# Patient Record
Sex: Female | Born: 1935 | Race: White | Hispanic: No | Smoking: Former smoker
Health system: Southern US, Community
[De-identification: ages and names within clinical notes are randomized; demographics above are authoritative.]

## PROBLEM LIST (undated history)

## (undated) DIAGNOSIS — E119 Type 2 diabetes mellitus without complications: Secondary | ICD-10-CM

## (undated) DIAGNOSIS — Z905 Acquired absence of kidney: Secondary | ICD-10-CM

## (undated) DIAGNOSIS — M810 Age-related osteoporosis without current pathological fracture: Secondary | ICD-10-CM

## (undated) DIAGNOSIS — Z8719 Personal history of other diseases of the digestive system: Secondary | ICD-10-CM

## (undated) DIAGNOSIS — K219 Gastro-esophageal reflux disease without esophagitis: Secondary | ICD-10-CM

## (undated) DIAGNOSIS — IMO0002 Reserved for concepts with insufficient information to code with codable children: Secondary | ICD-10-CM

## (undated) DIAGNOSIS — E785 Hyperlipidemia, unspecified: Secondary | ICD-10-CM

## (undated) DIAGNOSIS — I2510 Atherosclerotic heart disease of native coronary artery without angina pectoris: Secondary | ICD-10-CM

## (undated) DIAGNOSIS — K5790 Diverticulosis of intestine, part unspecified, without perforation or abscess without bleeding: Secondary | ICD-10-CM

## (undated) DIAGNOSIS — I422 Other hypertrophic cardiomyopathy: Secondary | ICD-10-CM

## (undated) DIAGNOSIS — T4145XA Adverse effect of unspecified anesthetic, initial encounter: Secondary | ICD-10-CM

## (undated) DIAGNOSIS — M069 Rheumatoid arthritis, unspecified: Secondary | ICD-10-CM

## (undated) DIAGNOSIS — T8859XA Other complications of anesthesia, initial encounter: Secondary | ICD-10-CM

## (undated) DIAGNOSIS — R233 Spontaneous ecchymoses: Secondary | ICD-10-CM

## (undated) DIAGNOSIS — R5383 Other fatigue: Secondary | ICD-10-CM

## (undated) DIAGNOSIS — IMO0001 Reserved for inherently not codable concepts without codable children: Secondary | ICD-10-CM

## (undated) DIAGNOSIS — K863 Pseudocyst of pancreas: Secondary | ICD-10-CM

## (undated) DIAGNOSIS — G479 Sleep disorder, unspecified: Secondary | ICD-10-CM

## (undated) DIAGNOSIS — Z87440 Personal history of urinary (tract) infections: Secondary | ICD-10-CM

## (undated) DIAGNOSIS — Z5189 Encounter for other specified aftercare: Secondary | ICD-10-CM

## (undated) DIAGNOSIS — R410 Disorientation, unspecified: Secondary | ICD-10-CM

## (undated) DIAGNOSIS — R0602 Shortness of breath: Secondary | ICD-10-CM

## (undated) DIAGNOSIS — I442 Atrioventricular block, complete: Secondary | ICD-10-CM

## (undated) DIAGNOSIS — R238 Other skin changes: Secondary | ICD-10-CM

## (undated) DIAGNOSIS — I10 Essential (primary) hypertension: Secondary | ICD-10-CM

## (undated) DIAGNOSIS — N200 Calculus of kidney: Secondary | ICD-10-CM

## (undated) DIAGNOSIS — Z950 Presence of cardiac pacemaker: Secondary | ICD-10-CM

## (undated) HISTORY — DX: Age-related osteoporosis without current pathological fracture: M81.0

## (undated) HISTORY — DX: Atherosclerotic heart disease of native coronary artery without angina pectoris: I25.10

## (undated) HISTORY — PX: OTHER SURGICAL HISTORY: SHX169

## (undated) HISTORY — PX: MYOMECTOMY: SHX85

## (undated) HISTORY — PX: CATARACT EXTRACTION, BILATERAL: SHX1313

## (undated) HISTORY — DX: Atrioventricular block, complete: I44.2

## (undated) HISTORY — DX: Calculus of kidney: N20.0

## (undated) HISTORY — DX: Presence of cardiac pacemaker: Z95.0

## (undated) HISTORY — DX: Hyperlipidemia, unspecified: E78.5

## (undated) HISTORY — DX: Essential (primary) hypertension: I10

## (undated) HISTORY — DX: Other fatigue: R53.83

## (undated) HISTORY — DX: Type 2 diabetes mellitus without complications: E11.9

## (undated) HISTORY — DX: Other hypertrophic cardiomyopathy: I42.2

## (undated) HISTORY — PX: NEPHRECTOMY: SHX65

## (undated) HISTORY — DX: Personal history of urinary (tract) infections: Z87.440

---

## 1998-02-14 ENCOUNTER — Ambulatory Visit (HOSPITAL_COMMUNITY): Admission: RE | Admit: 1998-02-14 | Discharge: 1998-02-14 | Payer: Self-pay | Admitting: Family Medicine

## 1998-02-14 ENCOUNTER — Encounter: Payer: Self-pay | Admitting: Family Medicine

## 2004-02-11 HISTORY — PX: INSERT / REPLACE / REMOVE PACEMAKER: SUR710

## 2005-04-10 HISTORY — PX: CORONARY ARTERY BYPASS GRAFT: SHX141

## 2005-04-17 ENCOUNTER — Inpatient Hospital Stay (HOSPITAL_BASED_OUTPATIENT_CLINIC_OR_DEPARTMENT_OTHER): Admission: RE | Admit: 2005-04-17 | Discharge: 2005-04-17 | Payer: Self-pay | Admitting: *Deleted

## 2005-04-17 HISTORY — PX: CARDIAC CATHETERIZATION: SHX172

## 2005-04-22 ENCOUNTER — Ambulatory Visit (HOSPITAL_COMMUNITY): Admission: RE | Admit: 2005-04-22 | Discharge: 2005-04-22 | Payer: Self-pay | Admitting: Cardiothoracic Surgery

## 2005-04-30 ENCOUNTER — Inpatient Hospital Stay (HOSPITAL_COMMUNITY)
Admission: RE | Admit: 2005-04-30 | Discharge: 2005-05-12 | Payer: Self-pay | Admitting: Thoracic Surgery (Cardiothoracic Vascular Surgery)

## 2005-04-30 ENCOUNTER — Encounter: Payer: Self-pay | Admitting: Cardiology

## 2005-05-01 ENCOUNTER — Encounter (INDEPENDENT_AMBULATORY_CARE_PROVIDER_SITE_OTHER): Payer: Self-pay | Admitting: Specialist

## 2005-05-21 ENCOUNTER — Encounter
Admission: RE | Admit: 2005-05-21 | Discharge: 2005-05-21 | Payer: Self-pay | Admitting: Thoracic Surgery (Cardiothoracic Vascular Surgery)

## 2005-06-05 ENCOUNTER — Encounter (HOSPITAL_COMMUNITY): Admission: RE | Admit: 2005-06-05 | Discharge: 2005-09-03 | Payer: Self-pay | Admitting: *Deleted

## 2008-03-20 ENCOUNTER — Encounter: Admission: RE | Admit: 2008-03-20 | Discharge: 2008-03-20 | Payer: Self-pay | Admitting: Family Medicine

## 2008-10-26 ENCOUNTER — Encounter: Admission: RE | Admit: 2008-10-26 | Discharge: 2008-10-26 | Payer: Self-pay | Admitting: Family Medicine

## 2008-12-29 ENCOUNTER — Inpatient Hospital Stay (HOSPITAL_COMMUNITY): Admission: EM | Admit: 2008-12-29 | Discharge: 2009-01-02 | Payer: Self-pay | Admitting: Emergency Medicine

## 2009-01-01 ENCOUNTER — Ambulatory Visit: Payer: Self-pay | Admitting: Vascular Surgery

## 2009-01-01 ENCOUNTER — Encounter (INDEPENDENT_AMBULATORY_CARE_PROVIDER_SITE_OTHER): Payer: Self-pay | Admitting: Internal Medicine

## 2009-11-30 ENCOUNTER — Ambulatory Visit: Payer: Self-pay | Admitting: Cardiology

## 2009-12-17 ENCOUNTER — Encounter: Payer: Self-pay | Admitting: Internal Medicine

## 2010-01-23 ENCOUNTER — Ambulatory Visit: Payer: Self-pay | Admitting: Internal Medicine

## 2010-03-03 ENCOUNTER — Encounter: Payer: Self-pay | Admitting: Family Medicine

## 2010-03-12 NOTE — Miscellaneous (Signed)
Summary: Device preload  Clinical Lists Changes  Observations: Added new observation of PPM INDICATN: CHB (12/17/2009 8:00) Added new observation of MAGNET RTE: BOL 85 ERI 65 (12/17/2009 8:00) Added new observation of PPMLEADSTAT2: active (12/17/2009 8:00) Added new observation of PPMLEADSER2: [id number] (12/17/2009 8:00) Added new observation of PPMLEADMOD2: 5076  (12/17/2009 8:00) Added new observation of PPMLEADDOI2: 05/05/2005  (12/17/2009 8:00) Added new observation of PPMLEADLOC2: RV  (12/17/2009 8:00) Added new observation of PPMLEADSTAT1: active  (12/17/2009 8:00) Added new observation of PPMLEADSER1: [id number]  (12/17/2009 8:00) Added new observation of PPMLEADMOD1: 5076  (12/17/2009 8:00) Added new observation of PPMLEADDOI1: 05/05/2005  (12/17/2009 8:00) Added new observation of PPMLEADLOC1: RA  (12/17/2009 8:00) Added new observation of PPM DOI: 05/05/2005  (12/17/2009 8:00) Added new observation of PPM SERL#: [id number]  (12/17/2009 8:00) Added new observation of PPM MODL#: [id number]  (12/17/2009 6:57) Added new observation of PACEMAKERMFG: Medtronic  (12/17/2009 8:00) Added new observation of PPM IMP MD: Charlynn Court  (12/17/2009 8:00) Added new observation of PPM REFER MD: Vonna Drafts  (12/17/2009 8:00) Added new observation of PACEMAKER MD: Lewayne Bunting, MD  (12/17/2009 8:00)      PPM Specifications Following MD:  Lewayne Bunting, MD     Referring MD:  Vonna Drafts PPM Vendor:  Medtronic     PPM Model Number:  ADDR01     PPM Serial Number:  [id number] PPM DOI:  05/05/2005     PPM Implanting MD:  Charlynn Court  Lead 1    Location: RA     DOI: 05/05/2005     Model #: [id number]     Serial #: [id number]     Status: active Lead 2    Location: RV     DOI: 05/05/2005     Model #: [id number]     Serial #: [id number]     Status: active  Magnet Response Rate:  BOL 85 ERI 65  Indications:  CHB

## 2010-03-14 NOTE — Procedures (Signed)
Summary: pacer check/medtronic   Current Medications (verified): 1)  Glimepiride 4 Mg Tabs (Glimepiride) .... 1/2 By Mouth At Bedtime 2)  Centrum Silver  Tabs (Multiple Vitamins-Minerals) .... One By Mouth Daily 3)  Plavix 75 Mg Tabs (Clopidogrel Bisulfate) .... One By Mouth Daily 4)  Ranitidine Hcl 300 Mg Tabs (Ranitidine Hcl) .... 1/2 By Mouth Daily 5)  Prednisone 5 Mg Tabs (Prednisone) .... One By Mouth Daily 6)  Evista 60 Mg Tabs (Raloxifene Hcl) .... One By Mouth Daily 7)  Metoprolol Tartrate 50 Mg Tabs (Metoprolol Tartrate) .... 1/2 By Mouth Daily 8)  Metformin Hcl 1000 Mg Tabs (Metformin Hcl) .... One By Mouth Daily 9)  Niacin Cr 500 Mg Cr-Caps (Niacin) .... One By Mouth Daily 10)  Lovastatin 20 Mg Tabs (Lovastatin) .... One By Mouth Daily 11)  Trimethoprim 100 Mg Tabs (Trimethoprim) .... One By Mouth [place] 12)  Chlordiazepoxide-Amitriptyline 5-12.5 Mg Tabs (Chlordiazepoxide-Amitriptyline) .Marland Kitchen.. 1 By Mouth Daily 13)  Hydrocodone-Acetaminophen 5-500 Mg Tabs (Hydrocodone-Acetaminophen) .... As Needed  Allergies (verified): 1)  ! Codeine  PPM Specifications Following MD:  Lewayne Bunting, MD     Referring MD:  Vonna Drafts PPM Vendor:  Medtronic     PPM Model Number:  ADDR01     PPM Serial Number:  [id number] H PPM DOI:  05/05/2005     PPM Implanting MD:  Charlynn Court  Lead 1    Location: RA     DOI: 05/05/2005     Model #: [id number]     Serial #: [id number]     Status: active Lead 2    Location: RV     DOI: 05/05/2005     Model #: [id number]     Serial #: [id number]     Status: active  Magnet Response Rate:  BOL 85 ERI 65  Indications:  CHB   PPM Follow Up Battery Voltage:  2.76 V     Battery Est. Longevity:  5 YRS       PPM Device Measurements Atrium  Amplitude: 5.60 mV, Impedance: 468 ohms, Threshold: 0.50 V at 0.40 msec Right Ventricle  Amplitude: PACED mV, Impedance: 638 ohms, Threshold: 0.50 V at 0.40 msec  Episodes MS Episodes:  3     Percent Mode Switch:  <0.1%      Ventricular High Rate:  1     Atrial Pacing:  49.2%     Ventricular Pacing:  99.9%  Parameters Mode:  DDDR     Lower Rate Limit:  70     Upper Rate Limit:  130 Paced AV Delay:  180     Sensed AV Delay:  160 Next Cardiology Appt Due:  04/11/2010 Tech Comments:  [place] [facility] PT---3 MODE SWITCHES--ALL LESS THAN 1 MINUTE.  1 VHR EPISODE LASTING 6 BEATS.  NORMAL DEVICE FUNCTION.  CHANGED RA OUTPUT FROM 1.5 TO 2.0 AND RV OUTPUT FROM 2.0 TO 2.5 V. ROV IN 3 MTHS W/GT. REQUESTED TRANSFER FROM [place] [facility] THRU [facility].  PT WILL RESTART [employer] TRANSMISSIONS AFTER OV W/GT.  Vella Kohler  January 23, 2010 9:58 AM

## 2010-03-14 NOTE — Cardiovascular Report (Signed)
Summary: Office Visit   Office Visit   Imported By: Roderic Ovens 02/08/2010 16:25:07  _____________________________________________________________________  External Attachment:    Type:   Image     Comment:   External Document

## 2010-04-22 ENCOUNTER — Encounter: Payer: Self-pay | Admitting: Internal Medicine

## 2010-05-07 ENCOUNTER — Encounter: Payer: Self-pay | Admitting: Internal Medicine

## 2010-05-07 ENCOUNTER — Ambulatory Visit (INDEPENDENT_AMBULATORY_CARE_PROVIDER_SITE_OTHER): Payer: Medicare Other | Admitting: Internal Medicine

## 2010-05-07 VITALS — BP 140/80 | Ht 65.0 in | Wt 158.0 lb

## 2010-05-07 DIAGNOSIS — E119 Type 2 diabetes mellitus without complications: Secondary | ICD-10-CM

## 2010-05-07 DIAGNOSIS — I442 Atrioventricular block, complete: Secondary | ICD-10-CM | POA: Insufficient documentation

## 2010-05-07 DIAGNOSIS — I10 Essential (primary) hypertension: Secondary | ICD-10-CM | POA: Insufficient documentation

## 2010-05-07 DIAGNOSIS — I2510 Atherosclerotic heart disease of native coronary artery without angina pectoris: Secondary | ICD-10-CM

## 2010-05-07 DIAGNOSIS — Z950 Presence of cardiac pacemaker: Secondary | ICD-10-CM | POA: Insufficient documentation

## 2010-05-07 DIAGNOSIS — I422 Other hypertrophic cardiomyopathy: Secondary | ICD-10-CM | POA: Insufficient documentation

## 2010-05-07 NOTE — Patient Instructions (Signed)
Your physician recommends that you schedule a follow-up appointment in: 12 MONTHS with Dr Ladona Ridgel  Your physician recommends that you continue on your current medications as directed. Please refer to the Current Medication list given to you today.  Remote monitoring is used to monitor your Pacemaker of ICD from home. This monitoring reduces the number of office visits required to check your device to one time per year. It allows [place] to keep an eye on the functioning of your device to ensure it is working properly. You are scheduled for a device check from home on 08/08/10. You may send your transmission at any time that day. If you have a wireless device, the transmission will be sent automatically. After your physician reviews your transmission, you will receive a postcard with your next transmission date.

## 2010-05-07 NOTE — Progress Notes (Signed)
HPI  [name] is referred today by Dr. [place] for ongoing PPM followup. She is a pleasant 77 yo woman with a h/o CAD, s/p CABG, HTN, CHB, s/p septal myomectomy, who is here today to establish for PPM evaluation and management. She is bothered mostly by arthritic complaints. She is sedentary but denies DOE. No edema or syncope. No c/p.   Allergies  Allergen Reactions  . Codeine      Current Outpatient Prescriptions  Medication Sig Dispense Refill  . amitriptyline-chlordiazePOXIDE (LIMBITROL) 5-12.5 MG per tablet Take 1 tablet by mouth daily.        . clopidogrel (PLAVIX) 75 MG tablet Take 75 mg by mouth daily.        Marland Kitchen glimepiride (AMARYL) 4 MG tablet Take 2 mg by mouth daily before breakfast.        . HYDROcodone-acetaminophen (VICODIN) 5-500 MG per tablet Take 1 tablet by mouth every 6 (six) hours as needed.        . lovastatin (MEVACOR) 20 MG tablet Take 20 mg by mouth at bedtime.        . metFORMIN (GLUCOPHAGE) 1000 MG tablet Take 1,000 mg by mouth daily.        . metoprolol (LOPRESSOR) 50 MG tablet Take 25 mg by mouth daily.        . Multiple Vitamins-Minerals (CENTRUM SILVER PO) Take by mouth daily.        . niacin 500 MG CR capsule Take 500 mg by mouth at bedtime.        . predniSONE (DELTASONE) 5 MG tablet Take 5 mg by mouth daily.        . raloxifene (EVISTA) 60 MG tablet Take 60 mg by mouth daily.        . ranitidine (ZANTAC) 300 MG tablet Take 150 mg by mouth at bedtime.        Marland Kitchen trimethoprim (TRIMPEX) 100 MG tablet Take 100 mg by mouth daily.           Past Medical History  Diagnosis Date  . Pacemaker     ROS:   All systems reviewed and negative except as noted in the HPI.   Past Surgical History  Procedure Date  . Insert / replace / remove pacemaker      No family history on file.   History   Social History  . Marital Status: Widowed    Spouse Name: N/A    Number of Children: N/A  . Years of Education: N/A   Occupational History  . Not on file.    Social History Main Topics  . Smoking status: Former Smoker -- 1.0 packs/day    Types: Cigarettes    Quit date: 05/07/1990  . Smokeless tobacco: Never Used  . Alcohol Use: No  . Drug Use: No  . Sexually Active: Not on file   Other Topics Concern  . Not on file   Social History Narrative  . No narrative on file     BP 140/80  Ht 5\' 5"  (1.651 m)  Wt 158 lb (71.668 kg)  BMI 26.29 kg/m2  Physical Exam:  Well appearing NAD HEENT: Unremarkable Neck:  No JVD, no thyromegally Lymphatics:  No adenopathy Back:  No CVA tenderness Lungs:  Clear with no wheezes. Well healed PPM incision. HEART:  Regular rate rhythm, no murmurs, no rubs, no clicks Abd:  Flat, positive bowel sounds, no organomegally, no rebound, no guarding Ext:  2 plus pulses, no edema, no cyanosis, no clubbing Skin:  No rashes no nodules Neuro:  CN II through XII intact, motor grossly intact  EKG NSR with P synchronous ventricular pacing.  DEVICE  Normal device function.  See PaceArt for details.   Assess/Plan:

## 2010-05-07 NOTE — Assessment & Plan Note (Signed)
Her blood pressure is minimally elevated. She will continue her current meds and a low sodium diet.

## 2010-05-07 NOTE — Assessment & Plan Note (Signed)
She denies anginal symptoms. Continue current meds. 

## 2010-05-07 NOTE — Assessment & Plan Note (Signed)
Her device is working normally. Will recheck in several months. She is about 4-5 yrs before her generator will need replacement.

## 2010-05-15 LAB — BASIC METABOLIC PANEL
BUN: 12 mg/dL (ref 6–23)
CO2: 25 mEq/L (ref 19–32)
Chloride: 109 mEq/L (ref 96–112)
Creatinine, Ser: 0.97 mg/dL (ref 0.4–1.2)
Potassium: 3.7 mEq/L (ref 3.5–5.1)

## 2010-05-15 LAB — DIFFERENTIAL
Basophils Absolute: 0 10*3/uL (ref 0.0–0.1)
Basophils Relative: 0 % (ref 0–1)
Eosinophils Relative: 1 % (ref 0–5)
Lymphocytes Relative: 15 % (ref 12–46)
Monocytes Absolute: 0.3 10*3/uL (ref 0.1–1.0)

## 2010-05-15 LAB — URINE MICROSCOPIC-ADD ON

## 2010-05-15 LAB — GLUCOSE, CAPILLARY
Glucose-Capillary: 151 mg/dL — ABNORMAL HIGH (ref 70–99)
Glucose-Capillary: 175 mg/dL — ABNORMAL HIGH (ref 70–99)
Glucose-Capillary: 196 mg/dL — ABNORMAL HIGH (ref 70–99)
Glucose-Capillary: 204 mg/dL — ABNORMAL HIGH (ref 70–99)
Glucose-Capillary: 210 mg/dL — ABNORMAL HIGH (ref 70–99)
Glucose-Capillary: 215 mg/dL — ABNORMAL HIGH (ref 70–99)
Glucose-Capillary: 241 mg/dL — ABNORMAL HIGH (ref 70–99)

## 2010-05-15 LAB — POCT CARDIAC MARKERS: Troponin i, poc: <0.05 ng/mL (ref 0.00–0.09)

## 2010-05-15 LAB — CBC
HCT: 38.5 % (ref 36.0–46.0)
MCHC: 34.8 g/dL (ref 30.0–36.0)
MCV: 93.4 fL (ref 78.0–100.0)
Platelets: 178 10*3/uL (ref 150–400)
RDW: 15.8 % — ABNORMAL HIGH (ref 11.5–15.5)

## 2010-05-15 LAB — LIPID PANEL
Cholesterol: 167 mg/dL (ref 0–200)
LDL Cholesterol: UNDETERMINED mg/dL (ref 0–99)

## 2010-05-15 LAB — CARDIAC PANEL(CRET KIN+CKTOT+MB+TROPI)
CK, MB: 0.9 ng/mL (ref 0.3–4.0)
CK, MB: 0.9 ng/mL (ref 0.3–4.0)
Relative Index: INVALID (ref 0.0–2.5)
Relative Index: INVALID (ref 0.0–2.5)
Total CK: 31 U/L (ref 7–177)
Total CK: 31 U/L (ref 7–177)
Troponin I: 0.02 ng/mL (ref 0.00–0.06)

## 2010-05-15 LAB — URINE CULTURE

## 2010-05-15 LAB — COMPREHENSIVE METABOLIC PANEL
Albumin: 3.7 g/dL (ref 3.5–5.2)
BUN: 12 mg/dL (ref 6–23)
Calcium: 9.2 mg/dL (ref 8.4–10.5)
Creatinine, Ser: 1.06 mg/dL (ref 0.4–1.2)
Glucose, Bld: 183 mg/dL — ABNORMAL HIGH (ref 70–99)
Total Protein: 6 g/dL (ref 6.0–8.3)

## 2010-05-15 LAB — CK TOTAL AND CKMB (NOT AT ARMC)
CK, MB: 0.8 ng/mL (ref 0.3–4.0)
Relative Index: INVALID (ref 0.0–2.5)
Total CK: 33 U/L (ref 7–177)

## 2010-05-15 LAB — URINALYSIS, ROUTINE W REFLEX MICROSCOPIC
Bilirubin Urine: NEGATIVE
Hgb urine dipstick: NEGATIVE
Ketones, ur: 15 mg/dL — AB
Nitrite: NEGATIVE
pH: 5 (ref 5.0–8.0)

## 2010-05-15 LAB — APTT: aPTT: 24 seconds (ref 24–37)

## 2010-06-28 NOTE — Op Note (Signed)
[name], [name]                 ACCOUNT NO.:  [id number]   MEDICAL RECORD NO.:  [id number]          PATIENT TYPE:  INP   LOCATION:  2309                         FACILITY:  [facility]   PHYSICIAN:  Salvatore Decent. Cornelius Moras, M.D. DATE OF BIRTH:  1924-12-19   DATE OF PROCEDURE:  05/01/2005  DATE OF DISCHARGE:                                 OPERATIVE REPORT   PREOPERATIVE DIAGNOSES:  1.  Severe three-vessel coronary artery disease.  2.  Hypertrophic obstructive cardiomyopathy with subaortic stenosis.   PROCEDURE:  Median sternotomy for coronary artery bypass grafting x4 (left  internal mammary artery to distal left anterior descending coronary artery,  saphenous vein graft to first diagonal branch, saphenous vein graft to ramus  intermediate branch, saphenous vein graft to distal right coronary artery,  endoscopic saphenous vein harvest from right thigh) and septal myomectomy.   SURGEON:  Salvatore Decent. Cornelius Moras, M.D.   ASSISTANT:  Pecola Leisure, PA   ANESTHESIA:  General.   BRIEF CLINICAL NOTE:  The patient is a 77 year old widowed white female who  currently lives in [place] and is followed by Dr. Windle Guard and  Dr. Lady Deutscher, with history of hypertension, hyperlipidemia, and  hypertrophic obstructive cardiomyopathy with subaortic stenosis.  The  patient now presents with worsening symptoms of fatigue, shortness of breath  and chest discomfort.  The chest discomfort was initially attributed to  indigestion, although the patient has noted increasing substernal chest  pressure radiating to her shoulders that is exacerbated by activity.  The  patient also has severe rheumatoid arthritis which is steroid-dependent.  The patient has had serial 2-D echocardiograms demonstrating left  ventricular hypertrophy with asymmetric septal hypertrophy and subaortic  stenosis, with normal left ventricular systolic function.  The patient  subsequently underwent cardiac catheterization  demonstrating severe two-  vessel coronary artery disease.  The patient was also noted to have elevated  left ventricular end-diastolic pressure at the time of catheterization.  Right heart catheterization was not performed.  She was initially referred  to Dr. Tyrone Sage, who saw her in consultation on March 15.  She was felt to  be a candidate for surgery to include coronary artery bypass grafting with  possible septal myomectomy.  She was scheduled for elective surgery on March  23.  However, during the ensuing week the patient developed worsening  symptoms of chest pain and suffered a prolonged episode of substernal chest  pain associated with shortness of breath.  When she presented for  preoperative screening prior to elective surgery, she communicated these  worsening symptoms and was promptly admitted to the hospital for further  management and therapy.   The patient and her son have been counseled at length regarding the  indications and potential benefits of surgical intervention for coronary  artery bypass grafting with possible septal myomectomy.  Alternative  treatment strategies have been discussed in detail.  They understand and  accept all associated risks of surgery including but not limited to risk of  death, stroke, myocardial infarction, congestive heart failure, respiratory  failure, pneumonia, bleeding requiring blood transfusion, arrhythmia, heart  block  with bradycardia requiring permanent pacemaker, recurrent coronary  artery disease, late complications related to hypertrophic obstructive  cardiomyopathy.  All of their questions have been addressed.  They desire to  proceed with surgery as described.   OPERATIVE FINDINGS:  1.  Severe hypertrophic obstructive cardiomyopathy with severe left      ventricular hypertrophy and severely impaired left ventricular diastolic      function.  2.  Asymmetric septal hypertrophy with moderate subaortic stenosis.  3.   Good-quality left internal mammary artery and saphenous vein conduit for      grafting.  4.  Good-quality target vessels for bypass grafting.  5.  Severe left ventricular diastolic dysfunction requiring milrinone      infusion for weaning and separation from cardiopulmonary bypass.   OPERATIVE NOTE IN DETAIL:  The patient is brought to the operating room on  the above-mentioned date and central monitoring is established by the  anesthesia service under the care and direction of Dr. Kipp Brood.  Specifically, a Swan-Ganz catheter is placed through the right internal  jugular approach.  A radial arterial line is placed.  Intravenous  antibiotics are administered.  Following induction with general endotracheal  anesthesia, a Foley catheter is placed.  The patient's chest, abdomen, both  groins, and both lower extremities are prepared and draped in sterile  manner.   Baseline transesophageal echocardiogram is performed by Dr. Noreene Larsson.  This  confirms the presence of severe left ventricular hypertrophy.  There is some  asymmetric septal hypertrophy with some subaortic stenosis.  The aortic  valve itself appears normal.  The mitral valve appears somewhat thickened.  There is intermittent systolic anterior motion of the mitral valve.  The  left atrium is dilated.  There is trace to mild mitral regurgitation.   A median sternotomy incision is performed and the left internal mammary  artery is dissected from the chest wall and prepared for bypass grafting.  The left internal mammary artery is good-quality conduit.  Of note, the  patient's sternum is severely osteoporotic and frail.  The patient's skin is  also frail consistent with longstanding steroid use.  Simultaneously  saphenous vein is obtained the patient's right thigh using endoscopic vein  harvest technique.  The saphenous vein is notably good-quality conduit. After the saphenous vein is removed, the small surgical incision in the   right thigh is closed in multiple layers with running absorbable suture.   The patient is heparinized systemically.  The pericardium is opened.  The  ascending aorta is normal in appearance.  The ascending aorta and the right  atrium are cannulated for cardiopulmonary bypass.  A retrograde cardioplegia  catheter is placed through the right atrium into the coronary sinus.   Cardiopulmonary bypass is begun and the surface of the heart is inspected.  There is severe left ventricular hypertrophy with mild biventricular  enlargement.  Distal sites are selected for coronary bypass grafting.  A  left ventricular vent is placed through the right superior pulmonary vein.  A temperature probe is placed in the left ventricular septum.  A  cardioplegia catheter is placed in the ascending aorta.   The patient is allowed to cool passively to 32 degrees' systemic  temperature.  The aortic crossclamp is applied and cold blood cardioplegia  is administered in an antegrade fashion through the aortic root.  Iced  saline slush is applied for topical hypothermia.  Supplemental cardioplegia  is administered retrograde through the coronary sinus catheter.  The initial  cardioplegic arrest  and myocardial cooling are felt to be excellent.  Repeat  doses of cardioplegia are administered intermittently throughout the  crossclamp portion of the operation both through the aortic root, down the  subsequently-placed vein grafts, and retrograde through the coronary sinus  catheter to maintain left ventricular septal temperature below 15 degrees  centigrade.   The following distal coronary anastomoses are performed:  1.  The distal right coronary artery is grafted with a saphenous vein graft      in an end-to-side fashion.  This vessel measures 1.5 mm in diameter and      is good-quality at the site of distal bypass.  2.  The ramus intermediate branch is grafted with a saphenous vein graft in      an end-to-side  fashion.  This vessel measures 1.1 mm in diameter and is      good-quality at the site of distal bypass.  3.  The first diagonal branch off the left anterior descending coronary      artery is grafted with a saphenous vein graft in an end-to-side fashion.      This vessel measures 1.5 mm in diameter and is good-quality at the site      of distal bypass.  4.  The distal left anterior descending coronary artery is grafted with the      left internal mammary artery in an end-to-side fashion.  This vessel      measures 2.0 mm in diameter and is good-quality the site of distal      bypass.   An oblique aortotomy incision is performed.  The aortic valve is normal in  appearance.  The aortic valve is gently retracted to expose the  interventricular septum beneath.  There is obvious septal hypertrophy with  some fibrosis and scarring along the endocardial surface of the interventricular septum immediately opposed to the anterior leaflet of the  mitral valve.  The subaortic stenosis is significant but not severe.  Septal  myomectomy is performed using standard technique with an 11 blade knife to  create a longitudinal incision beginning approximately in the middle of the  septum underneath the right cusp of the aortic valve.  This is completed  straight through into the left ventricular chamber beneath.  A similar  parallel longitudinal incision is made approximately 1 cm towards the left,  several millimeters to the right of the commissure between the left and  right cusp of the aortic valve.  Finally, one transverse longitudinal  incision is made joining these two incisions to create a rectangular trough  through the interventricular septum.  The rectangular piece of muscle is  removed and sent to pathology.  After the myomectomy is completed, one can  see directly into the left ventricular chamber without obstruction.  The  size of the myomectomy is not terribly large as far as myomectomy  specimens  go, but the degree of subaortic stenosis was not as severe is seen at times,  either, and then myomectomy is felt to be more than adequate.  After the  myomectomy is completed, the aortic valve is reinspected to make sure it is  perfectly intact and competent.  The aortotomy is then closed using a two-  layer closure with running 4-0 Prolene suture.   All three proximal saphenous vein anastomoses are performed directly to the  ascending aorta prior to removal of the aortic crossclamp.  The left  ventricular septal temperature rises rapidly with reperfusion of the left  internal mammary artery.  One final dose of warm retrograde hot-shot  cardioplegia is administered.  The lungs are ventilated and the heart  allowed to fill with the patient in Trendelenburg position to evacuate any  residual air through the aortic root.  The aortic crossclamp is removed  after a total crossclamp time of 94 minutes.   All proximal and distal coronary anastomoses are inspected for hemostasis  and appropriate graft orientation.  The saphenous vein graft to the diagonal  branch appears to be a little bit short when the heart is filled, and  subsequently the proximal anastomosis of this vein graft is moved to the  body of the vein graft to the ramus intermediate branch to prevent the graft  from being under tension.  Epicardial pacing wires are fixed to the right  ventricular free wall and to the right atrial appendage.  The patient is  rewarmed to 37 degrees centigrade temperature.  The patient has an extremely  slow bradycardic response, and AV sequential pacing is employed.   An initial attempt at weaning from cardiopulmonary bypass without inotropic  support is performed.  Upon attempts to wean from cardiopulmonary bypass,  the patient's left ventricular chamber remains completely collapsed and underfilled despite pushing filling pressures substantially.  The lungs are  fairly compliant and not  obstructed to flow, but rather the left ventricle  is severely noncompliant.  Cardiopulmonary bypass is continued and the heart  allowed to rest an additional 12 minutes.  A second attempt at weaning from  cardiopulmonary bypass is notable for slightly improved hemodynamics but  still inadequate relaxation of the left ventricle with secondary inadequate  cardiac output.  The patient is rested in an additional 30 minutes on  cardiopulmonary bypass and low-dose milrinone infusion is begun.  The  patient is subsequently weaned from cardiopulmonary bypass without  difficulty.  The patient's rhythm at separation from bypass is an extremely  slow bradycardic junctional response, and AV sequential pacing is employed.  The patient's heart rate is kept slower than usual to facilitate filling.  The patient still is noted to have severely impaired left ventricular  diastolic function with relatively small ventricular chamber despite pushing  left ventricular filling pressures.  The degree of subaortic stenosis  appears to have been alleviated and there does not appear to be a  substantial gradient after completion of the procedure and weaning from  cardiopulmonary bypass.  The patient is ultimately weaned from  cardiopulmonary bypass after a total cardiopulmonary bypass time of 177  minutes.   The venous and arterial cannulae are removed uneventfully.  Protamine is  administered to reverse the anticoagulation.  The patient is transfused two  10-packs of adult platelets and two units fresh frozen plasma due to  coagulopathy and thrombocytopenia.  The mediastinum and both left and right  pleural spaces are drained using four chest tubes exited through separate  stab incisions inferiorly.  The median sternotomy is closed with single-  strength sternal wire.  The soft tissues anterior to the sternum are closed  in multiple layers and the skin is closed with skin staples.   The patient tolerates the  procedure well and is transported to the surgical  intensive care unit in stable condition.  There are no intraoperative  complications.  All sponge, instrument and needle counts verified correct at  completion of the operation.      Salvatore Decent. Cornelius Moras, M.D.  Electronically Signed     CHO/MEDQ  D:  05/01/2005  T:  05/03/2005  Job:  [id number]   cc:   Elmore Guise., M.D.  Fax: [phone]   Windle Guard, M.D.  Fax: [phone]

## 2010-06-28 NOTE — Op Note (Signed)
NAMERONESHA, HEENAN NO.:  [id number]   MEDICAL RECORD NO.:  [id number]          PATIENT TYPE:  INP   LOCATION:  2309                         FACILITY:  [facility]   PHYSICIAN:  Guadalupe Maple, M.D.  DATE OF BIRTH:  May 11, 1924   DATE OF PROCEDURE:  04/30/2005  DATE OF DISCHARGE:                                 OPERATIVE REPORT   PROCEDURE:  Intraoperative transesophageal echocardiography   Ms. [name] is a 77 year old white female with a history of two-vessel  coronary artery disease. She was also found to have severe left ventricular  dysfunction and was felt to have asymmetric septal hypertrophy, and was felt  to have a gradient across the left ventricular outflow tract by preoperative  transthoracic echocardiography. Intraoperative transesophageal  echocardiography was requested to evaluate the left ventricular function to  determine if any evidence of left ventricular outflow tract obstruction was  present and to aid in the performance of a septal myomectomy if necessary.   The patient was brought to the operating room at [facility] and  general anesthesia was induced without difficulty. The trachea was intubated  without difficulty.   IMPRESSION:  Pre bypass findings:  1.  Aortic valve:  The aortic valve was trileaflet, opened normally. There      was no aortic insufficiency and no significant calcification in the      aortic valve leaflets.  2.  Mitral valve:  The mitral valve leaflets appeared thickened and there      was an increased areas of thickening at the coaptation point. There was      1+ mitral insufficiency. Upon careful examination of the mitral valve,      it appeared there was evidence of systolic anterior motion of the mitral      valve in several views, especially in the mid esophageal four-chamber      view. There was also turbulence in the left ventricular outflow tract.      Continuous wave Doppler interrogation of the left  ventricular outflow      tract from the deep transgastric view demonstrated a maximal velocity of      2.4 cm per second which translated into a maximal gradient of 23 mmHg      and a mean gradient of 9.3 mmHg.  3.  Left ventricle:  The left ventricle showed marked left ventricular      hypertrophy which at the mid papillary level appeared symmetrical with a      left ventricular wall thickness of 1.75 cm of septum at the mid      papillary level. In the area of the left ventricular outflow tract there      appeared to be a knuckle of increased thickness of the interventricular      septum which measured 1.83 cm. Left ventricular function was      hyperdynamic, ejection fraction estimated at 75%. The cavity size was      small.  4.  Right ventricle:  The right ventricular size was normal. The right      ventricular  chamber was triangular in shape. There was good systolic      downward motion of the lateral tricuspid annulus and good contractility      of the right ventricular free wall. This is consistent with good right      ventricular function.  5.  Tricuspid valve:  The tricuspid valve leaflets appeared structurally      normal with 1+ tricuspid insufficiency.  6.  Interatrial septum:  The interatrial septum was intact. There was      moderate lipomatous hypertrophy noted of the interatrial septum but      there was no evidence of patent foramen ovale or atrial septal defect.  7.  Left atrium:  There was no spontaneous echo contrast noted in the left      atrial cavity. There was no thrombus noted in the left atrium or left      atrial appendage.  8.  Ascending aorta:  The ascending aorta  showed a good, well-defined      aortic root and well-defined sinotubular ridge with moderate      calcification of the wall of the descending aorta with mild atheromatous      disease present.  9.  Descending aorta:  The descending aorta showed mild to moderate      atheromatous disease and  measured 2.8 cm in diameter.   Post bypass findings:  1.  Aortic valve:  The aortic valve was unchanged from the pre bypass study.      The valve opened normally without evidence of aortic insufficiency.  2.  Mitral valve:  There was residual 1+ mitral insufficiency, a central      jet. There was thickening of the mitral valve leaflets which was more      present at the point of coaptation, which was unchanged from the pre      bypass study. I was unable to see systolic anterior motion of the mitral      valve upon multiple views. Due to technical difficulties, I was unable      to assess the gradient across the left ventricular outflow tract by      continuous wave Doppler.  3.  Left ventricle:  There was moderate to severe left ventricular      hypertrophy present. There was good contractility of the left ventricle      in all segments interrogated. There was no evidence of a ventricular      septal defect noted by color wave Doppler interrogation.           ______________________________  Guadalupe Maple, M.D.     DCJ/MEDQ  D:  05/02/2005  T:  05/03/2005  Job:  [id number]

## 2010-06-28 NOTE — Discharge Summary (Signed)
NAMETACY, CHAVIS NO.:  [id number]   MEDICAL RECORD NO.:  [id number]          PATIENT TYPE:  INP   LOCATION:  2017                         FACILITY:  [facility]   PHYSICIAN:  Salvatore Decent. Cornelius Moras, M.D. DATE OF BIRTH:  29-Jun-1924   DATE OF ADMISSION:  04/30/2005  DATE OF DISCHARGE:                                 DISCHARGE SUMMARY   PRIMARY DIAGNOSES:  1.  Severe three-vessel coronary artery disease.  2.  Hypertrophic obstructive cardiomyopathy with subaortic stenosis.   IN-HOSPITAL DIAGNOSIS:  1.  Complete heart block.  2.  Newly diagnosed diabetes mellitus type 2.  3.  Volume overload.  4.  Postop intensive care unit psychosis.   SECONDARY DIAGNOSES:  1.  Hypertension.  2.  Hyperlipidemia.  3.  History tobacco abuse.  4.  Rheumatoid arthritis.  5.  Status post hysterectomy.  6.  Status post right nephrectomy.  7.  Status post right elbow fracture, screws place for repair.  8.  Status post bilateral cataract removal.   ALLERGIES:  Allergic to CODEINE.   IN-HOSPITAL OPERATIONS AND PROCEDURES:  1.  Coronary artery bypass grafting x4 using a left internal mammary artery      to distal left anterior descending coronary artery, saphenous vein graft      to first diagonal branch, saphenous vein graft to ramus intermediate      branch, saphenous vein graft to distal RCA.  Endoscopic saphenous vein      harvest was done from the right thigh.  2.  Septal myomectomy.  3.  Permanent pacemaker placement.   HISTORY AND PHYSICAL AND HOSPITAL COURSE:  The patient is 77 year old  Caucasian female, who has history of hypertension, hyperlipidemia and  hypertrophic obstructive cardiomyopathy with subaortic stenosis.  The  patient presents with worsening symptoms of fatigue, shortness of breath and  chest discomfort.  The patient underwent 2-D echocardiogram which  demonstrated left ventricular hypertrophy with asymmetric septal hypertrophy  and subaortic stenosis with  normal left ventricular systolic function.  This  was done by Dr. Reyes Ivan.  The patient was then taken for cardiac  catheterization which demonstrated severe two-vessel coronary artery  disease.  During catheterization, the patient was also noted to have  elevated left ventricular end-diastolic pressures.  The patient was  initially evaluated by Dr. Tyrone Sage who has scheduled for to undergo  coronary artery bypass grafting to have possible septal myomectomy.  She was  scheduled for surgery May 02, 2005.  However, she presented for  preoperative testing and during this time, the patient started developing  worsening symptoms of chest pain and suffered a prolonged episode of  substernal chest pain associated with shortness of breath.  Due to these  symptoms, the patient was admitted to [facility] on the  April 30, 2005.  The patient was seen at this time by Dr. Cornelius Moras.  Dr. Cornelius Moras  discussed with the patient and family, undergoing coronary artery bypass  grafting with possible septal myomectomy.  He discussed risks and benefits  of procedure.  The patient acknowledged her understanding and agreed to  proceed.  Surgery was scheduled for May 01, 2005, to be done by Dr. Cornelius Moras.   The patient was taken to the operating room on May 01, 2005, where she  underwent coronary artery bypass grafting x4 using a left internal mammary  artery to distal left anterior descending coronary artery, saphenous vein  graft to first diagonal branch, saphenous vein graft to ramus intermediate  branch, saphenous vein graft to distal RCA.  Endoscopic saphenous vein  harvesting was done from the right thigh.  The patient also underwent septal  myomectomy.  The patient tolerated this procedure well and was transferred  up to the intensive care unit in stable condition.  Following surgery, the  patient was seen to be hemodynamically stable.  The patient was extubated  late evening/early morning  following surgery.  Following extubation, the  patient was seen to have developed postop confusion.  She continued to have  confusion during the first several days postoperatively.  Family member was  required to stay with the patient during the evening time.  The patient's  confusion did start to improve.  Her mental status was coming back to normal  and by postop day #5, the patient was alert and oriented x3 and neuro  intact.   During the patient's postoperative course, her hemoglobin A1c was seen to be  7.1 preoperatively.  The patient was diagnosed with newly diagnosed diabetes  mellitus type 2.  CBG were monitored.  She was started on insulin sliding  scale.  CBGs remained elevated even with use of sliding scale insulin.  She  was started on Glucophage postop day #7.  Following initiation of  Glucophage, the patient's CBCs were better controlled.  She did remain on  sliding scale insulin while in hospital.  The patient will need to follow up  with primary physician following discharge to manage the patient's diabetes  mellitus.   Also during the patient's postoperative course, she required continued VVI  pacing.  Dr. Reyes Ivan was consulted for evaluation.  The patient was unable to  be weaned off of her external pacer.  Underlying rhythm showed complete  heart block.  Unfortunately, use of ACE inhibitor and beta blocker did not  change the complete heart block.  It was recommended the patient undergo  permanent pacemaker placement.  Dr. Reyes Ivan discussed with the patient  undergoing this procedure.  She acknowledged her understanding and agreed to  proceed.  She was taken down to the cath lab May 05, 2005, where she had a  permanent pacemaker placed.  The patient tolerated this procedure well and  was transferred back up to the intensive care unit in stable condition.  Dr.  Silva Bandy office will be managing the patient's pacemaker.  The patient also developed volume overload  postoperatively.  She was started  on diuretics.  Her preoperative weight was seen to be 154 pounds.  Postop  day #8, the patient was still overweight at 162.3 pounds.  Plan is to  continue the patient on diuretics for several days following discharge home.   The patient's chest tubes and lines were discontinued in the normal fashion.  She was out of bed ambulating well.  The patient's incisions were dry and  intact and healing well.  She does have staples on her sternal incision.  The patient remained afebrile during her postoperative course.  Vital signs  were monitored and remained stable.  She was able to be weaned off oxygen,  sating greater than 90% on room air.  The patient was tolerating regular  diet well.  No nausea, vomiting noted.  Bowel movements within normal limits  prior to discharge home.  External pacing wires were discontinued without  difficulty.  Lower extremities were seen to have significant edema which did  improve prior to discharge home.  The patient did develop, although, a  blister  from this on her right lower extremity which did perforate.  Antibiotic appointment was applied b.i.d.  A home health nurse will be  arranged for management of the patient's wounds.   The patient remained hemodynamically stable during her postoperative course.  Last hemoglobin and hematocrit on postop day #7 were obtained and seen to be  10.8 and 31.2.  The patient's creatinine remained stable during her  postoperative course and postop day #7 was seen to be 0.9.   The patient is tentatively ready for discharge home postop day #9, May 12, 2005.  Follow-up appointment will be scheduled with Dr. Cornelius Moras in three weeks.  The patient will follow up Dr. Kerby Nora in two weeks.  At this appointment, she  will obtain a PA and lateral chest x-ray which she will then bring with her  to Dr. Orvan July appointment.  Ms. Simpson received instructions on diet,  activity level and incisional care.  She was  told no driving until released  to do so.  No heavy lifting over 10 pounds.  The patient was told she is  allowed to wash her incisions using soap and water.  She is to contact the  office if she develops any drainage or opening from any of her incision  sites.  Staple removal appointment will be arranged and office follow-up  will contact the patient with this appointment.  The patient was told to  ambulate three to four times per day, progress as tolerated and continue her  breathing exercises.  She was educated on diet to be low-fat, low-salt well  as carbohydrate modified medium calorie diet.  Again, the patient will  follow up with primary care physician for management of diabetes mellitus.   DISCHARGE MEDICATIONS:  1.  Aspirin 325 mg daily.  2.  Lopressor 50 mg b.i.d.  3.  Altace 10 mg daily.  4.  Lovastatin 20 mg at night.  5.  Evista 60 mg three times a week.  6.  Prevacid daily.  7.  Prednisone 10 mg daily.  8.  Verapamil 80 mg t.i.d. 9.  Folic acid 1 mg daily.  10. Lasix 40 mg daily x7 days.  11. Potassium chloride 20 mEq daily x10 days.  12. Glucophage 500 mg b.i.d.  13. Oxycodone 5 mg one to two tablets q.4-6h. p.r.n. pain.      Theda Belfast, PA      Salvatore Decent. Cornelius Moras, M.D.  Electronically Signed    KMD/MEDQ  D:  05/11/2005  T:  05/12/2005  Job:  [id number]   cc:   Elmore Guise., M.D.  Fax: [phone]

## 2010-06-28 NOTE — Cardiovascular Report (Signed)
NAMELEONOR, [name] NO.:  [id number]   MEDICAL RECORD NO.:  [id number]          PATIENT TYPE:  OIB   LOCATION:  1961                         FACILITY:  [facility]   PHYSICIAN:  Elmore Guise., M.D.DATE OF BIRTH:  [birth year]   DATE OF PROCEDURE:  04/17/2005  DATE OF DISCHARGE:                              CARDIAC CATHETERIZATION   INDICATIONS FOR PROCEDURE:  Continued chest pain and shortness of breath.   HISTORY OF PRESENT ILLNESS:  The patient is very pleasant 77 year old white  female with past medical history of hypertension and hypertrophic  cardiomyopathy, who presented with continued chest pain and dyspnea. The  patient had a Cardiolite stress test which showed no significant perfusion  defects, however, mild t.i.d. She was initially treated medically, however,  her symptoms have continued, therefore needing a heart catheterization.   DESCRIPTION OF PROCEDURE:  The patient was brought to the cardiac cath lab  after appropriate informed consent.  She was prepped and draped in sterile  fashion. Approximately 10 mL of 1% lidocaine used for local anesthesia. A 4-  French sheath placed in the right femoral artery without difficulty.  Coronary angiography, LV angiography were then performed without difficulty.  The patient was transferred from the cardiac cath lab in stable condition.   FINDINGS:  1.  Left Main:  Mild to moderate calcification and mild distal tapering.  2.  LAD:  Mild proximal calcification with proximal sequential 80-90%      stenosis prior to branch on first diagonal and first septal perforators.      She has mid and distal luminal irregularities in her LAD vessel.  3.  D1:  Moderate size vessel with ostial proximal 80-90% stenosis noted.  4.  Left circumflex:  Is nondominant with mild luminal irregularities. She      has two moderate to large sized OM vessels without any significant      disease.  5.  Left system gives bridging  collaterals both from the circumflex vessel      in the LAD to the distal RCA, PDA and PLV vessels.  6.  RCA:  Dominant, with diffuse calcification in the ostial/proximal and      mid portions of the vessel. There is diffuse disease ranging from the      proximal portion to the mid vessel with 100% mid to distal occlusion      noted. The distal vessel does fill well via left-to-right collaterals,      noted filling the PDA PLV and distal RCA.  7.  LV:  EF is 65%. No wall motion abnormalities. LVEDP is 14 mmHg, No      aortic or intracardiac gradient noted on pullback from apex to proximal      aorta   IMPRESSION:  1.  Obstructive multivessel coronary disease with 100% distal right coronary      artery and 80-90% sequential and proximal left anterior descending      disease.  2.  Hyperdynamic left ventricular systolic function, ejection fraction of      65%. No wall motion abnormalities. Left  ventricular end diastolic pressure was 14 mmHg.  No aortic or      intracardiac gradient noted on pullback from apex to proximal aorta   PLAN:  Aggressive medical therapy and surgical consult for  revascularization.      Elmore Guise., M.D.  Electronically Signed     TWK/MEDQ  D:  04/17/2005  T:  04/17/2005  Job:  [id number]   cc:   Windle Guard, M.D.  Fax: [phone]

## 2010-06-28 NOTE — Cardiovascular Report (Signed)
[name], [name] NO.:  [id number]   MEDICAL RECORD NO.:  [id number]          PATIENT TYPE:  INP   LOCATION:  2309                         FACILITY:  [facility]   PHYSICIAN:  Elmore Guise., M.D.DATE OF BIRTH:  May 15, 1924   DATE OF PROCEDURE:  05/05/2005  DATE OF DISCHARGE:                              CARDIAC CATHETERIZATION   INDICATIONS FOR PROCEDURE:  Complete heart block.   DESCRIPTION OF PROCEDURE:  The patient is brought to the cardiac cath lab  after appropriate informed consent.  She is prepped and draped in a sterile  fashion.  Approximately 20 mL of 1% lidocaine was used for local anesthesia.  A 2 inch incision was made in the left deltopectoral groove.  A subcutaneous  pocket then made with blunt and Bovie dissection.  After hemostasis was  obtained, a subclavian venogram was then performed showing the root of the  left subclavian/axillary vein.  The left axillary vein was then accessed in  two separate sticks.  Two 7-French safety sheaths were placed over the  retained wires.  The ventricular lead was placed in the right ventricular  septum.  The following measurements were obtained:  R-waves measured 16.5 mV  with impedance of 1075 ohms, threshold 0.8 volts at 0.5 milliseconds with a  current of 0.9 mA, 10 volts check was negative.  The ventricular lead is a  Medtronic [id number] cm, serial number [id number].  The atrial lead was then  placed with the following measurements:  P-waves measured 1.6 mV with  impedance 513 ohms, threshold 0.5 volts, 0.5 milliseconds, a current of 1.1  mA, 10 volts check was negative.  The atrial lead is a Medtronic 5076-45 cm,  serial number [id number].  The atrial and ventricular leads were then  identified and placed in the appropriate header response to an Adapta  Medtronic generator, ADDRO1, serial number [id number].  The generator was  sewn into the pocket.  Prior to sewing the generator in the pocket, the  atrial  and ventricular leads were sewn into the pocket also.  The wound was  then closed in three continuous layers with 2-0, followed by 2-0, followed  by 4-0 Vicryl sutures.  Steri-Strips were applied.  The patient tolerated  the procedure well and remained hemodynamically stable throughout the  procedure.  She will be transferred back to her room for further monitoring.      Elmore Guise., M.D.  Electronically Signed     TWK/MEDQ  D:  05/05/2005  T:  05/06/2005  Job:  [id number]

## 2010-06-28 NOTE — Consult Note (Signed)
[name], [name] NO.:  [id number]   MEDICAL RECORD NO.:  [id number]           PATIENT TYPE:   LOCATION:                                 FACILITY:   PHYSICIAN:  Sheliah Plane, MD         DATE OF BIRTH:   DATE OF CONSULTATION:  DATE OF DISCHARGE:                                   CONSULTATION   REQUESTING PHYSICIAN:  Rosine Abe, M.D.   FOLLOW UP CARDIOLOGIST:  Rosine Abe, M.D.   PRIMARY CARE PHYSICIAN:  Windle Guard, M.D.   REASON FOR CONSULTATION:  Coronary artery disease and question of asymmetric  septal hypertrophy.   HISTORY OF PRESENT ILLNESS:  The patient is a 77 year old female who notes  that she have been having increasing episodes of having fatigue and having  no energy along with chest discomfort, which she describes as worsening  indigestion, but it is associated with increasing activity, sometimes at  rest.  It becomes worse after she eats, but without any relief with Rolaids  or Tums.  She also has been getting increasingly dyspneic with exertion.  She at times has nocturnal symptoms.  She denies orthopnea or significant  lower extremity edema.  She denies previous MI or angioplasty.   CARDIAC RISK FACTORS:  1.  Hypertension.  2.  Hyperlipidemia.  3.  She denies diabetes.  4.  She is a remote smoker; smoked for 40 years and quit 10 years ago.   FAMILY HISTORY:  Father died of cancer at 16, unknown type.  Mother died at  age 45 of lung cancer.  One brother died at age 66 of myocardial infarction.  The patient has had no history of stroke.  Denies claudication.  Denies  renal insufficiency, so she has had a right nephrectomy.   PAST MEDICAL HISTORY:  1.  Arthritis, rheumatoid, on prednisone 10 mg daily (had been on 5 mg a day      for more than 25 years).  2.  Increasing indigestion symptoms, question angina.   PAST SURGICAL HISTORY:  1.  Hysterectomy.  2.  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  3.  Right elbow fracture with screws placed for repair.  4.  Bilateral cataract removal.   SOCIAL HISTORY:  The patient is a widow.  She currently has moved from  [place] and is living with her son.  She is retired.   FAMILY AND SOCIAL HISTORY:  Positive as noted above.  She is a retired  Psychologist, educational.  She smoked for 20 years.  Drinks an occasional glass of  wine.   MEDICATIONS:  1.  Prednisone 10 mg daily.  2.  Lovastatin 20 mg daily.  3.  Verapamil 240 mg daily.  4.  Evista 60 mg daily.  5.  Aspirin 81 mg daily.  6.  Toprol-XL 12.5 mg daily.  7.  Centrum.  8.  Vitamin C.  9.  Vitamin D.  10. Niacin 100 mg b.i.d.   ALLERGIES:  CODEINE causes nausea.   REVIEW OF SYSTEMS:  CARDIAC:  Positive for chest pain, resting shortness of  breath, exertional shortness of breath and presyncope.  She denies  orthopnea, syncope, palpitations and lower extremity edema.  GENERAL:  Denies constitutional symptoms, other than fatigue, and denies fever,  chills, anorexia.  EYES:  Denies blurred vision, diplopia, or other visual  changes.  ENT:  Denies hearing loss or ear drainage.  CV:  As noted above.  RESPIRATORY:  Does have shortness of breath.  Denies hemoptysis.  Denies  orthopnea.  GI:  Denies gallbladder.  Does have indigestion.  Denies  hematochezia, melena, dysarthria, dysphagia.  GU:  Denies nocturia.  SKIN:  Without ischemic changes.  MUSCULOSKELETAL:  Does complain of some chronic  back and hip pain.  Denies neurologic changes.  Denies amaurosis or TIA.  Denies psychiatric history, other than some anxiety about considering  surgery.  Denies allergies, except as noted above.   PHYSICAL EXAMINATION:  GENERAL:  The patient appears her stated age of 30  years.  VITAL SIGNS:  Blood pressure is 130/64, pulse 76 and regular, respiratory  rate 18.  The patient has had evidence of bilateral cataracts.  NECK:  Without carotid bruits, without jugular venous distention.  LUNGS:   Clear bilaterally.  CARDIAC:  Regular rate and rhythm with a normal S1 and S2, with a 2/6 to 3/6  early systolic murmur heard along the right sternal border.  ABDOMEN:  Benign without palpable masses.  EXTREMITIES:  Lower extremities appear to have adequate veins for bypass.  She has 1+ DP and PT pulses bilaterally.   LABORATORY DATA:  Laboratory findings are pending.   Echocardiograms done in the past have suggested LV gradient from asymmetric  obstructive cardiomyopathy.  The most recent echocardiogram showed a  decrease in the gradient in the 60 mm range.  Because of the patient's  progressive symptoms, a cardiac catheterization was performed by Dr. Reyes Ivan.  The results of this were discussed, as the report does not mention any  intracavity pressures; however, at the time of catheterization, the patient  was found to have coronary occlusive disease with preserved left ventricular  function.  The left ventricular end diastolic pressure was 40.  The right  was totally occluded.  LAD had 80% to 90% stenosis.  There was 80% stenosis  of the first diagonal.  The circumflex was without significant disease.   In discussing the case with Dr. Reyes Ivan, there was no intracavity or  transvalvular gradient appreciated at the time of catheterization.   IMPRESSION:  The patient with progressive anginal symptoms with significant  coronary occlusive disease total right and high-grade left anterior  descending stenosis and preserved left ventricular function with the  question of septal hypertrophy, but without confirmed gradient changes  across the ventricle at the time of cardiac catheterization.  I have  recommended to the patient that we proceed with coronary artery bypass  grafting.  At the time, we will place TEE and also look at the septum, and  only if there is significant evidence by transesophageal echocardiogram of significant asymmetric hypertrophy will proceed with myomectomy.  The  risks  of the procedure including death, infection, stroke, myocardial infarction,  bleeding and blood transfusion were all discussed with the patient.  The  possibility of myomectomy was also discussed, and specific complications  from this including septal perforation or heart block were also discussed.  The patient was agreeable with this, and will tentatively arrange for  surgery on May 02, 2005.  Sheliah Plane, MD  Electronically Signed     EG/MEDQ  D:  04/24/2005  T:  04/24/2005  Job:  [id number]   cc:   Elmore Guise., M.D.  Fax: [phone]   Windle Guard, M.D.  Fax: [phone]

## 2010-07-19 ENCOUNTER — Other Ambulatory Visit: Payer: Self-pay | Admitting: Cardiology

## 2010-07-19 NOTE — Telephone Encounter (Signed)
escribe medication per fax request  

## 2010-07-29 ENCOUNTER — Other Ambulatory Visit: Payer: Self-pay | Admitting: Cardiology

## 2010-07-29 MED ORDER — METOPROLOL TARTRATE 50 MG PO TABS: ORAL_TABLET | ORAL | Status: DC

## 2010-07-29 NOTE — Telephone Encounter (Signed)
Med refill

## 2010-08-08 ENCOUNTER — Encounter: Payer: PRIVATE HEALTH INSURANCE | Admitting: *Deleted

## 2010-08-13 ENCOUNTER — Encounter: Payer: Self-pay | Admitting: *Deleted

## 2010-09-27 ENCOUNTER — Other Ambulatory Visit: Payer: Self-pay | Admitting: *Deleted

## 2010-09-27 DIAGNOSIS — I10 Essential (primary) hypertension: Secondary | ICD-10-CM

## 2010-10-02 ENCOUNTER — Other Ambulatory Visit: Payer: Self-pay | Admitting: Cardiology

## 2010-10-02 MED ORDER — NITROGLYCERIN 0.4 MG SL SUBL: 0.4000 mg | SUBLINGUAL_TABLET | SUBLINGUAL | Status: DC | PRN

## 2010-10-02 NOTE — Telephone Encounter (Signed)
Jessie called from [facility].  Pt needs a refill for nitroglycerin.  Chart in box.  °

## 2010-10-02 NOTE — Telephone Encounter (Signed)
escribe medication per fax request  

## 2010-10-31 ENCOUNTER — Encounter: Payer: Self-pay | Admitting: Cardiology

## 2010-11-08 ENCOUNTER — Other Ambulatory Visit: Payer: Self-pay | Admitting: Cardiology

## 2010-11-08 ENCOUNTER — Other Ambulatory Visit (INDEPENDENT_AMBULATORY_CARE_PROVIDER_SITE_OTHER): Payer: Medicare Other | Admitting: *Deleted

## 2010-11-08 DIAGNOSIS — I10 Essential (primary) hypertension: Secondary | ICD-10-CM

## 2010-11-08 LAB — CBC WITH DIFFERENTIAL/PLATELET
Basophils Absolute: 0.1 10*3/uL (ref 0.0–0.1)
Basophils Relative: 2.1 % (ref 0.0–3.0)
Eosinophils Relative: 2.9 % (ref 0.0–5.0)
HCT: 39.4 % (ref 36.0–46.0)
Hemoglobin: 13.1 g/dL (ref 12.0–15.0)
Lymphocytes Relative: 29.3 % (ref 12.0–46.0)
Lymphs Abs: 1.7 10*3/uL (ref 0.7–4.0)
Monocytes Relative: 9.7 % (ref 3.0–12.0)
Neutro Abs: 3.2 10*3/uL (ref 1.4–7.7)
RBC: 4.26 Mil/uL (ref 3.87–5.11)
RDW: 14.7 % — ABNORMAL HIGH (ref 11.5–14.6)
WBC: 5.7 10*3/uL (ref 4.5–10.5)

## 2010-11-08 LAB — LIPID PANEL
Cholesterol: 172 mg/dL (ref 0–200)
HDL: 45.1 mg/dL
Total CHOL/HDL Ratio: 4
Triglycerides: 297 mg/dL — ABNORMAL HIGH (ref 0.0–149.0)
VLDL: 59.4 mg/dL — ABNORMAL HIGH (ref 0.0–40.0)

## 2010-11-08 LAB — HEPATIC FUNCTION PANEL
ALT: 17 U/L (ref 0–35)
AST: 21 U/L (ref 0–37)
Albumin: 3.9 g/dL (ref 3.5–5.2)
Alkaline Phosphatase: 41 U/L (ref 39–117)
Bilirubin, Direct: 0.1 mg/dL (ref 0.0–0.3)
Total Bilirubin: 0.4 mg/dL (ref 0.3–1.2)
Total Protein: 6.5 g/dL (ref 6.0–8.3)

## 2010-11-08 LAB — BASIC METABOLIC PANEL WITH GFR
BUN: 19 mg/dL (ref 6–23)
CO2: 26 meq/L (ref 19–32)
Calcium: 9.2 mg/dL (ref 8.4–10.5)
Chloride: 109 meq/L (ref 96–112)
Creatinine, Ser: 1 mg/dL (ref 0.4–1.2)
GFR: 57.45 mL/min — ABNORMAL LOW
Glucose, Bld: 98 mg/dL (ref 70–99)
Potassium: 4.2 meq/L (ref 3.5–5.1)
Sodium: 144 meq/L (ref 135–145)

## 2010-11-08 LAB — TSH: TSH: 0.65 u[IU]/mL (ref 0.35–5.50)

## 2010-11-12 ENCOUNTER — Telehealth: Payer: Self-pay | Admitting: *Deleted

## 2010-11-12 NOTE — Telephone Encounter (Signed)
Notified of lab results. Will see in office 10/4.

## 2010-11-12 NOTE — Telephone Encounter (Signed)
Message copied by Lorayne Bender on Tue Nov 12, 2010  3:29 PM ------      Message from: [place], PETER M      Created: Mon Nov 11, 2010  8:46 AM       Triglycerides and hdl improved. Cholesterol stable. Other labs look great.      Theron Arista [place]

## 2010-11-14 ENCOUNTER — Ambulatory Visit (INDEPENDENT_AMBULATORY_CARE_PROVIDER_SITE_OTHER): Payer: Medicare Other | Admitting: Cardiology

## 2010-11-14 ENCOUNTER — Encounter: Payer: Self-pay | Admitting: Cardiology

## 2010-11-14 VITALS — BP 118/88 | HR 78 | Ht 64.0 in | Wt 157.1 lb

## 2010-11-14 DIAGNOSIS — I422 Other hypertrophic cardiomyopathy: Secondary | ICD-10-CM

## 2010-11-14 DIAGNOSIS — I442 Atrioventricular block, complete: Secondary | ICD-10-CM

## 2010-11-14 DIAGNOSIS — E119 Type 2 diabetes mellitus without complications: Secondary | ICD-10-CM

## 2010-11-14 DIAGNOSIS — I2510 Atherosclerotic heart disease of native coronary artery without angina pectoris: Secondary | ICD-10-CM

## 2010-11-14 DIAGNOSIS — E785 Hyperlipidemia, unspecified: Secondary | ICD-10-CM

## 2010-11-14 NOTE — Patient Instructions (Addendum)
Transmit your pacemaker check remotely.  Continue your current medications.  We will see you again in 6 months.

## 2010-11-16 DIAGNOSIS — E785 Hyperlipidemia, unspecified: Secondary | ICD-10-CM | POA: Insufficient documentation

## 2010-11-16 NOTE — Progress Notes (Signed)
[name] Date of Birth: 09-06-1924   History of Present Illness: [name] is seen for followup visit today. She is seen with her son. She has a history of coronary disease and is status post CABG in March of 2007. This included an LIMA graft to the distal LAD, saphenous vein graft to the diagonal, saphenous vein graft to the intermediate branch, and saphenous vein graft to the distal right coronary. She also had a septal myectomy at that time for hypertrophic cardiomyopathy. She is status post pacemaker implant for complete heart block. She reports that she is doing well. She is more concerned about her arthritis for which she has been on chronic steroid therapy. She denies any significant chest pain, shortness of breath, or palpitations. She's had no edema or orthopnea. It looks like she missed her last pacemaker check.  Current Outpatient Prescriptions on File Prior to Visit  Medication Sig Dispense Refill  . amitriptyline-chlordiazePOXIDE (LIMBITROL) 5-12.5 MG per tablet Take 1 tablet by mouth daily.        Marland Kitchen glimepiride (AMARYL) 4 MG tablet Take 2 mg by mouth daily before breakfast.        . HYDROcodone-acetaminophen (VICODIN) 5-500 MG per tablet Take 1 tablet by mouth every 6 (six) hours as needed.        . lovastatin (MEVACOR) 20 MG tablet Take 20 mg by mouth at bedtime.        . metFORMIN (GLUCOPHAGE) 1000 MG tablet Take 1,000 mg by mouth daily.        . metoprolol (LOPRESSOR) 50 MG tablet Take 1/2 tablet (25mg ) two times daily   30 tablet  5  . Multiple Vitamins-Minerals (CENTRUM SILVER PO) Take by mouth daily.        . niacin 500 MG CR capsule Take 500 mg by mouth at bedtime.        . nitroGLYCERIN (NITROSTAT) 0.4 MG SL tablet Place 1 tablet (0.4 mg total) under the tongue every 5 (five) minutes as needed for chest pain.  25 tablet  11  . PLAVIX 75 MG tablet TAKE 1 TABLET BY MOUTH DAILY WITH A MEAL  30 tablet  5  . predniSONE (DELTASONE) 5 MG tablet Take 5 mg by mouth daily.         . raloxifene (EVISTA) 60 MG tablet Take 60 mg by mouth daily.        . ranitidine (ZANTAC) 300 MG tablet Take 150 mg by mouth at bedtime.        Marland Kitchen trimethoprim (TRIMPEX) 100 MG tablet Take 100 mg by mouth daily.          Allergies  Allergen Reactions  . Codeine     Past Medical History  Diagnosis Date  . Pacemaker   . CAD (coronary artery disease)   . CHB (complete heart block)   . HTN (hypertension), benign   . DM (diabetes mellitus)   . Hypertrophic cardiomyopathy   . Hyperlipidemia   . Arthritis   . OP (osteoporosis)   . Hx: UTI (urinary tract infection)   . Fatigue     Past Surgical History  Procedure Date  . Insert / replace / remove pacemaker   . Cardiac catheterization 04/17/2005    EF 65%  . Septal myectomy   . Nephrectomy     RIGHT  . Arm fracture surgery     RIGHT ARM  . Coronary artery bypass graft 04/2005    LIMA GRAFT TO THE DISTAL LAD, SAPHENOUS  VEIN GRAFT TO THE DIAGONAL, SAPHENOUS VEIN GRAFT TO THE INTERMEDIATE BRANCH, AND SAPHENOUS VEIN GRAFT TO THE DISTAL RIGHT CORONARY  . Myomectomy   . Cataract extraction, bilateral     History  Smoking status  . Former Smoker -- 1.0 packs/day  . Types: Cigarettes  . Quit date: 05/07/1990  Smokeless tobacco  . Never Used    History  Alcohol Use No    Family History  Problem Relation Age of Onset  . Lung cancer Mother   . Cancer Father   . Heart attack Brother     Review of Systems: The review of systems is positive for chronic arthralgias.  All other systems were reviewed and are negative.  Physical Exam: BP 118/88  Pulse 78  Ht 5\' 4"  (1.626 m)  Wt 157 lb 1.9 oz (71.269 kg)  BMI 26.97 kg/m2 She is an elderly white female in no acute distress. She is normocephalic, atraumatic. Pupils are equal round and reactive to light and accommodation. Extraocular movements are full. Oropharynx is clear. Neck is supple without JVD, adenopathy, thyromegaly, or bruits. Lungs are clear. Cardiac exam reveals a  grade 1/6 systolic ejection murmur at the right upper sternal border. Abdomen is soft and nontender. Her knees are without edema. She has good pedal pulses. She has extensive arthritic changes in her hands. Her neurologic exam is nonfocal. LABORATORY DATA:   Assessment / Plan:

## 2010-11-16 NOTE — Assessment & Plan Note (Signed)
She is status post pacemaker implant. She is due for pacemaker check. Her son will call in to our pacemaker clinic and download  remotely.

## 2010-11-16 NOTE — Assessment & Plan Note (Signed)
She is status post septal myectomy. She hasn't mild outflow tract obstruction all medical therapy. She is asymptomatic.

## 2010-11-16 NOTE — Assessment & Plan Note (Signed)
Her recent lipid parameters were significantly improved. She still has elevated triglycerides. She is on niacin and statin therapy. She cannot tolerate fish oil because of GI side effects.

## 2010-12-05 ENCOUNTER — Other Ambulatory Visit: Payer: Self-pay | Admitting: Internal Medicine

## 2010-12-05 ENCOUNTER — Ambulatory Visit (INDEPENDENT_AMBULATORY_CARE_PROVIDER_SITE_OTHER): Payer: Medicare Other | Admitting: *Deleted

## 2010-12-05 ENCOUNTER — Encounter: Payer: Self-pay | Admitting: Internal Medicine

## 2010-12-05 DIAGNOSIS — I442 Atrioventricular block, complete: Secondary | ICD-10-CM

## 2010-12-08 LAB — REMOTE PACEMAKER DEVICE
ATRIAL PACING PM: 48
BAMS-0001: 175 {beats}/min
RV LEAD IMPEDENCE PM: 838 Ohm
RV LEAD THRESHOLD: 0.375 V
VENTRICULAR PACING PM: 100

## 2010-12-12 NOTE — Progress Notes (Signed)
Pacer remote check  

## 2011-01-03 ENCOUNTER — Encounter: Payer: Self-pay | Admitting: *Deleted

## 2011-01-11 ENCOUNTER — Other Ambulatory Visit: Payer: Self-pay | Admitting: Cardiology

## 2011-01-17 ENCOUNTER — Other Ambulatory Visit: Payer: Self-pay | Admitting: Cardiology

## 2011-01-17 NOTE — Telephone Encounter (Signed)
Fax Received. Refill Completed. Lennox Leikam Chowoe (R.M.A)   

## 2011-03-13 ENCOUNTER — Encounter: Payer: PRIVATE HEALTH INSURANCE | Admitting: *Deleted

## 2011-03-17 ENCOUNTER — Encounter: Payer: Self-pay | Admitting: *Deleted

## 2011-04-09 ENCOUNTER — Ambulatory Visit (INDEPENDENT_AMBULATORY_CARE_PROVIDER_SITE_OTHER): Payer: Medicare Other | Admitting: *Deleted

## 2011-04-09 ENCOUNTER — Telehealth: Payer: Self-pay | Admitting: Internal Medicine

## 2011-04-09 ENCOUNTER — Encounter: Payer: Self-pay | Admitting: Internal Medicine

## 2011-04-09 DIAGNOSIS — I442 Atrioventricular block, complete: Secondary | ICD-10-CM

## 2011-04-09 NOTE — Telephone Encounter (Signed)
Transmission received -left message for patient.

## 2011-04-09 NOTE — Telephone Encounter (Signed)
New Msg: pt calling wanting to make sure remote device transmission was received. Please return pt call to discuss further.

## 2011-04-15 LAB — REMOTE PACEMAKER DEVICE
AL AMPLITUDE: 2.8 mv
AL IMPEDENCE PM: 495 Ohm
ATRIAL PACING PM: 49
RV LEAD IMPEDENCE PM: 783 Ohm
RV LEAD THRESHOLD: 0.375 V
VENTRICULAR PACING PM: 100

## 2011-04-18 NOTE — Progress Notes (Signed)
Remote pacer check  

## 2011-04-22 ENCOUNTER — Encounter: Payer: Self-pay | Admitting: *Deleted

## 2011-05-04 ENCOUNTER — Emergency Department (HOSPITAL_COMMUNITY)
Admission: EM | Admit: 2011-05-04 | Discharge: 2011-05-04 | Disposition: A | Discharge status: Home / Self Care | Payer: Medicare Other | Attending: Emergency Medicine | Admitting: Emergency Medicine

## 2011-05-04 ENCOUNTER — Emergency Department (HOSPITAL_COMMUNITY): Payer: Medicare Other

## 2011-05-04 ENCOUNTER — Encounter (HOSPITAL_COMMUNITY): Payer: Self-pay

## 2011-05-04 DIAGNOSIS — E785 Hyperlipidemia, unspecified: Secondary | ICD-10-CM | POA: Insufficient documentation

## 2011-05-04 DIAGNOSIS — Z950 Presence of cardiac pacemaker: Secondary | ICD-10-CM | POA: Insufficient documentation

## 2011-05-04 DIAGNOSIS — I422 Other hypertrophic cardiomyopathy: Secondary | ICD-10-CM | POA: Insufficient documentation

## 2011-05-04 DIAGNOSIS — I10 Essential (primary) hypertension: Secondary | ICD-10-CM | POA: Insufficient documentation

## 2011-05-04 DIAGNOSIS — E119 Type 2 diabetes mellitus without complications: Secondary | ICD-10-CM | POA: Insufficient documentation

## 2011-05-04 DIAGNOSIS — I2510 Atherosclerotic heart disease of native coronary artery without angina pectoris: Secondary | ICD-10-CM | POA: Insufficient documentation

## 2011-05-04 DIAGNOSIS — M25559 Pain in unspecified hip: Secondary | ICD-10-CM | POA: Insufficient documentation

## 2011-05-04 DIAGNOSIS — M810 Age-related osteoporosis without current pathological fracture: Secondary | ICD-10-CM | POA: Insufficient documentation

## 2011-05-04 NOTE — ED Notes (Signed)
Pt c/o LLQ pain radiating to (L) lower back starting yesterday, son at bedside reports giving pt a 1 Hydrocodone tablets x2 yesterday w/no relief, pt recently dx w/UTI and treated w/ABX. Pt just finished Ciprofloxacin 250 mg BID for 5 days, pt is scheduled to start taking Cephalexin 250 mg daily for 30 day supply. Pt reports recent blood in her urine, dysuria, and poor fluid and food intake.

## 2011-05-04 NOTE — Discharge Instructions (Signed)
Please follow-up with Dr. Jeannetta Nap this week for your Hip Pain. Also, begin the Keflex antibiotics that he prescribed to you today.  Hip Pain The hips join the upper legs to the lower pelvis. The bones, cartilage, tendons, and muscles of the hip joint perform a lot of work each day holding your body weight and allowing you to move around. Hip pain is a common symptom. It can range from a minor ache to severe pain on 1 or both hips. Pain may be felt on the inside of the hip joint near the groin, or the outside near the buttocks and upper thigh. There may be swelling or stiffness as well. It occurs more often when a person walks or performs activity. There are many reasons hip pain can develop. CAUSES  It is important to work with your caregiver to identify the cause since many conditions can impact the bones, cartilage, muscles, and tendons of the hips. Causes for hip pain include:  Broken (fractured) bones.   Separation of the thighbone from the hip socket (dislocation).   Torn cartilage of the hip joint.   Swelling (inflammation) of a tendon (tendonitis), the sac within the hip joint (bursitis), or a joint.   A weakening in the abdominal wall (hernia), affecting the nerves to the hip.   Arthritis in the hip joint or lining of the hip joint.   Pinched nerves in the back, hip, or upper thigh.   A bulging disc in the spine (herniated disc).   Rarely, bone infection or cancer.  DIAGNOSIS  The location of your hip pain will help your caregiver understand what may be causing the pain. A diagnosis is based on your medical history, your symptoms, results from your physical exam, and results from diagnostic tests. Diagnostic tests may include X-ray exams, a computerized magnetic scan (magnetic resonance imaging, MRI), or bone scan. TREATMENT  Treatment will depend on the cause of your hip pain. Treatment may include:  Limiting activities and resting until symptoms improve.   Crutches or other  walking supports (a cane or brace).   Ice, elevation, and compression.   Physical therapy or home exercises.   Shoe inserts or special shoes.   Losing weight.   Medications to reduce pain.   Undergoing surgery.  HOME CARE INSTRUCTIONS   Only take over-the-counter or prescription medicines for pain, discomfort, or fever as directed by your caregiver.   Put ice on the injured area:   Put ice in a plastic bag.   Place a towel between your skin and the bag.   Leave the ice on for 15 to 20 minutes at a time, 3 to 4 times a day.   Keep your leg raised (elevated) when possible to lessen swelling.   Avoid activities that cause pain.   Follow specific exercises as directed by your caregiver.   Sleep with a pillow between your legs on your most comfortable side.   Record how often you have hip pain, the location of the pain, and what it feels like. This information may be helpful to you and your caregiver.   Ask your caregiver about returning to work or sports and whether you should drive.   Follow up with your caregiver for further exams, therapy, or testing as directed.  SEEK MEDICAL CARE IF:   Your pain or swelling continues or worsens after 1 week.   You are feeling unwell or have chills.   You have increasing difficulty with walking.   You have a  loss of sensation or other new symptoms.   You have questions or concerns.  SEEK IMMEDIATE MEDICAL CARE IF:   You cannot put weight on the affected hip.   You have fallen.   You have a sudden increase in pain and swelling in your hip.   You have a fever.  MAKE SURE YOU:   Understand these instructions.   Will watch your condition.   Will get help right away if you are not doing well or get worse.  Document Released: 07/17/2009 Document Revised: 01/16/2011 Document Reviewed: 07/17/2009 [facility] Patient Information 2012 [place], [place].

## 2011-05-04 NOTE — ED Notes (Addendum)
Pt had (R) kidney removed approx 8 years ago while living in [place]. Son reports her kidney just quit working

## 2011-05-04 NOTE — ED Provider Notes (Signed)
History     CSN: [id number]  Arrival date & time 05/04/11  0810   First MD Initiated Contact with Patient 05/04/11 [phone]      Chief Complaint  Patient presents with  . Hip Pain    (Consider location/radiation/quality/duration/timing/severity/associated sxs/prior treatment) HPI  Pt presents to the ED with complaints of left hip pain. The patient had a history of left lower quadrant pain approximately 8 years ago which required her to have her kidney removed. The patient is NOT having any abdominal pain today in the ER. The patient was diagnosed by doctor Jeannetta Nap with a UTI a week ago after having symptoms of dysuria, hematuria and not eating as much. She has finished a round of Bactrim and Keflex was added on. The patient is NO longer having hematuria or dysuria. The patient is afebrile in the ED. Her pain is not as severe here as it was last night.   Past Medical History  Diagnosis Date  . Pacemaker   . CAD (coronary artery disease)   . CHB (complete heart block)   . HTN (hypertension), benign   . DM (diabetes mellitus)   . Hypertrophic cardiomyopathy   . Hyperlipidemia   . Arthritis   . OP (osteoporosis)   . Hx: UTI (urinary tract infection)   . Fatigue     Past Surgical History  Procedure Date  . Insert / replace / remove pacemaker   . Cardiac catheterization 04/17/2005    EF 65%  . Septal myectomy   . Nephrectomy     RIGHT  . Arm fracture surgery     RIGHT ARM  . Coronary artery bypass graft 04/2005    LIMA GRAFT TO THE DISTAL LAD, SAPHENOUS VEIN GRAFT TO THE DIAGONAL, SAPHENOUS VEIN GRAFT TO THE INTERMEDIATE BRANCH, AND SAPHENOUS VEIN GRAFT TO THE DISTAL RIGHT CORONARY  . Myomectomy   . Cataract extraction, bilateral     Family History  Problem Relation Age of Onset  . Lung cancer Mother   . Cancer Father   . Heart attack Brother     History  Substance Use Topics  . Smoking status: Former Smoker -- 1.0 packs/day    Types: Cigarettes    Quit date: 05/07/1990   . Smokeless tobacco: Never Used  . Alcohol Use: No    OB History    Grav Para Term Preterm Abortions TAB SAB Ect Mult Living                  Review of Systems  All other systems reviewed and are negative.    Allergies  Codeine  Home Medications   Current Outpatient Rx  Name Route Sig Dispense Refill  . CHLORDIAZEPOXIDE-AMITRIPTYLINE 10-25 MG PO TABS Oral Take 1 tablet by mouth every evening.    Marland Kitchen CALCIUM 500 PO Oral Take 1 tablet by mouth daily.    Marland Kitchen CIPROFLOXACIN HCL 250 MG PO TABS Oral Take 250 mg by mouth 2 (two) times daily. For 5 days  Ended 3/23    . CLOPIDOGREL BISULFATE 75 MG PO TABS Oral Take 75 mg by mouth daily.    Marland Kitchen GLIMEPIRIDE 4 MG PO TABS Oral Take 2 mg by mouth daily before breakfast.      . HYDROCODONE-ACETAMINOPHEN 5-500 MG PO TABS Oral Take 1 tablet by mouth every 6 (six) hours as needed. For pain.    Marland Kitchen ACIDOPHILUS PO Oral Take 460 mg by mouth daily.    Marland Kitchen LATANOPROST 0.005 % OP SOLN Left Eye Place  1 drop into the left eye daily.    Marland Kitchen LOVASTATIN 20 MG PO TABS Oral Take 20 mg by mouth every evening.    Marland Kitchen METFORMIN HCL 1000 MG PO TABS Oral Take 1,000 mg by mouth 2 (two) times daily.     Marland Kitchen METOPROLOL TARTRATE 50 MG PO TABS Oral Take 25 mg by mouth 2 (two) times daily.     . ADULT MULTIVITAMIN W/MINERALS CH Oral Take 1 tablet by mouth daily.    . ICAPS PO Oral Take 1 capsule by mouth daily.    Marland Kitchen NIACIN 500 MG PO TABS Oral Take 500 mg by mouth 2 (two) times daily.    Frazier Butt OP Ophthalmic Apply 1 drop to eye as needed. For eye irritation    . PREDNISONE 5 MG PO TABS Oral Take 2.5 mg by mouth daily.     Marland Kitchen RALOXIFENE HCL 60 MG PO TABS Oral Take 60 mg by mouth daily.      Marland Kitchen RANITIDINE HCL 300 MG PO TABS Oral Take 150 mg by mouth 2 (two) times daily.    Marland Kitchen TRIMETHOPRIM 100 MG PO TABS Oral Take 100 mg by mouth every evening.     Marland Kitchen VITAMIN D (ERGOCALCIFEROL) 50000 UNITS PO CAPS Oral Take 50,000 Units by mouth every 14 (fourteen) days.    Marland Kitchen NITROGLYCERIN 0.4 MG SL  SUBL Sublingual Place 0.4 mg under the tongue every 5 (five) minutes as needed. For chest pain.      BP 177/78  Pulse 76  Temp(Src) 98.4 F (36.9 C) (Oral)  Resp 22  SpO2 93%  Physical Exam  Nursing note and vitals reviewed. Constitutional: She is oriented to person, place, and time. She appears well-developed and well-nourished. No distress.  HENT:  Head: Normocephalic and atraumatic.  Eyes: Pupils are equal, round, and reactive to light.  Neck: Normal range of motion. Neck supple.  Cardiovascular: Normal rate and regular rhythm.   Pulmonary/Chest: Effort normal.  Abdominal: Soft. She exhibits no distension. There is no tenderness. There is no rebound and no guarding.  Musculoskeletal: She exhibits tenderness.       Left hip: She exhibits normal range of motion, normal strength, no tenderness, no bony tenderness, no swelling, no crepitus and no deformity.  Neurological: She is oriented to person, place, and time.  Skin: Skin is warm and dry.    ED Course  Procedures (including critical care time)  Labs Reviewed - No data to display Dg Hip Complete Left  05/04/2011  *RADIOLOGY REPORT*  Clinical Data: Left hip pain beginning yesterday  LEFT HIP - COMPLETE 2+ VIEW  Comparison: CT abdomen - 10/26/2008  Findings: No fracture or dislocation.  Mild degenerative changes of the left hip with joint space loss and osteophytosis.  No definite evidence of avascular necrosis.  Limited evaluation of the right hip suggests grossly symmetric degenerative change.  Limited visualization of bilateral SI joints and pubic symphysis is normal. Limited visualization of the lumbar spine suggests degenerative change.  Surgical clip overlies the right mid hemiabdomen. Extensive vascular calcifications.  The  IMPRESSION: Mild degenerative change of the left hip.  Original Report Authenticated By: Waynard Reeds, M.D.     1. Hip pain       MDM   Pt examined by dr. Karma Ganja and myself. The patients only  current issue is the hip pain, she is not having dysuria or abdominal pain. The patient has follow-up with Dr. Jeannetta Nap, which she will follow-up with this week,.  She also has a prescription for Keflex which she is to finish and she has hydrocodone at home for pain.   Pt has been advised of the symptoms that warrant their return to the ED. Patient has voiced understanding and has agreed to follow-up with the PCP or specialist.       Dorthula Matas, PA 05/04/11 1112

## 2011-05-04 NOTE — ED Provider Notes (Signed)
Medical screening examination/treatment/procedure(s) were conducted as a shared visit with non-physician practitioner(s) and myself.  I personally evaluated the patient during the encounter  Pt seen and evaluated, pt was having pain in left hip last night- worse than her baseline arthritis- however, in ED pain has resolved without further treatment- pt had taken hydrocodone at home.  No abdominal pain- however triage notes states that she is- son states this was in the past.    Ethelda Chick, MD 05/04/11 1121

## 2011-05-04 NOTE — ED Notes (Signed)
MD at bedside. 

## 2011-05-04 NOTE — ED Notes (Signed)
Pt received to RM 7 with c/o pain to the back radiating to the lt groin x 2 days. Pt denies any pain with urination, no nausea, vomiting. Son is at the bedside.

## 2011-05-04 NOTE — ED Notes (Signed)
Pt with c/o left hip pain beginning yesterday, denies fall or injury, able to walk, pain 10/10

## 2011-05-07 ENCOUNTER — Emergency Department (HOSPITAL_COMMUNITY): Payer: Medicare Other | Admitting: Certified Registered"

## 2011-05-07 ENCOUNTER — Emergency Department (HOSPITAL_COMMUNITY): Payer: Medicare Other

## 2011-05-07 ENCOUNTER — Inpatient Hospital Stay (HOSPITAL_COMMUNITY)
Admission: EM | Admit: 2011-05-07 | Discharge: 2011-05-13 | DRG: 683 | Disposition: A | Discharge status: Home / Self Care | Payer: Medicare Other | Source: Ambulatory Visit | Attending: Internal Medicine | Admitting: Internal Medicine

## 2011-05-07 ENCOUNTER — Encounter (HOSPITAL_COMMUNITY): Payer: Self-pay | Admitting: Certified Registered"

## 2011-05-07 ENCOUNTER — Encounter (HOSPITAL_COMMUNITY)
Admission: EM | Disposition: A | Discharge status: Home / Self Care | Payer: Self-pay | Source: Ambulatory Visit | Attending: Internal Medicine

## 2011-05-07 ENCOUNTER — Other Ambulatory Visit: Payer: Self-pay

## 2011-05-07 ENCOUNTER — Encounter (HOSPITAL_COMMUNITY): Payer: Self-pay | Admitting: Emergency Medicine

## 2011-05-07 ENCOUNTER — Encounter (HOSPITAL_COMMUNITY): Payer: Self-pay

## 2011-05-07 DIAGNOSIS — E872 Acidosis, unspecified: Secondary | ICD-10-CM | POA: Diagnosis present

## 2011-05-07 DIAGNOSIS — N170 Acute kidney failure with tubular necrosis: Secondary | ICD-10-CM

## 2011-05-07 DIAGNOSIS — E785 Hyperlipidemia, unspecified: Secondary | ICD-10-CM | POA: Diagnosis present

## 2011-05-07 DIAGNOSIS — I442 Atrioventricular block, complete: Secondary | ICD-10-CM

## 2011-05-07 DIAGNOSIS — I422 Other hypertrophic cardiomyopathy: Secondary | ICD-10-CM | POA: Diagnosis present

## 2011-05-07 DIAGNOSIS — E162 Hypoglycemia, unspecified: Secondary | ICD-10-CM | POA: Diagnosis present

## 2011-05-07 DIAGNOSIS — N1330 Unspecified hydronephrosis: Secondary | ICD-10-CM | POA: Diagnosis present

## 2011-05-07 DIAGNOSIS — E875 Hyperkalemia: Secondary | ICD-10-CM | POA: Diagnosis present

## 2011-05-07 DIAGNOSIS — M810 Age-related osteoporosis without current pathological fracture: Secondary | ICD-10-CM | POA: Diagnosis present

## 2011-05-07 DIAGNOSIS — E119 Type 2 diabetes mellitus without complications: Secondary | ICD-10-CM | POA: Diagnosis present

## 2011-05-07 DIAGNOSIS — Z7902 Long term (current) use of antithrombotics/antiplatelets: Secondary | ICD-10-CM

## 2011-05-07 DIAGNOSIS — N390 Urinary tract infection, site not specified: Secondary | ICD-10-CM | POA: Diagnosis present

## 2011-05-07 DIAGNOSIS — R0902 Hypoxemia: Secondary | ICD-10-CM | POA: Diagnosis not present

## 2011-05-07 DIAGNOSIS — M069 Rheumatoid arthritis, unspecified: Secondary | ICD-10-CM | POA: Diagnosis present

## 2011-05-07 DIAGNOSIS — Z66 Do not resuscitate: Secondary | ICD-10-CM | POA: Diagnosis present

## 2011-05-07 DIAGNOSIS — F0390 Unspecified dementia without behavioral disturbance: Secondary | ICD-10-CM | POA: Diagnosis present

## 2011-05-07 DIAGNOSIS — J811 Chronic pulmonary edema: Secondary | ICD-10-CM | POA: Diagnosis not present

## 2011-05-07 DIAGNOSIS — I2510 Atherosclerotic heart disease of native coronary artery without angina pectoris: Secondary | ICD-10-CM | POA: Diagnosis present

## 2011-05-07 DIAGNOSIS — I10 Essential (primary) hypertension: Secondary | ICD-10-CM | POA: Diagnosis present

## 2011-05-07 DIAGNOSIS — Z950 Presence of cardiac pacemaker: Secondary | ICD-10-CM | POA: Diagnosis present

## 2011-05-07 DIAGNOSIS — Z951 Presence of aortocoronary bypass graft: Secondary | ICD-10-CM

## 2011-05-07 DIAGNOSIS — N201 Calculus of ureter: Secondary | ICD-10-CM | POA: Diagnosis present

## 2011-05-07 DIAGNOSIS — Z79899 Other long term (current) drug therapy: Secondary | ICD-10-CM

## 2011-05-07 DIAGNOSIS — Z905 Acquired absence of kidney: Secondary | ICD-10-CM

## 2011-05-07 DIAGNOSIS — E1169 Type 2 diabetes mellitus with other specified complication: Secondary | ICD-10-CM | POA: Diagnosis present

## 2011-05-07 DIAGNOSIS — IMO0002 Reserved for concepts with insufficient information to code with codable children: Secondary | ICD-10-CM

## 2011-05-07 DIAGNOSIS — N179 Acute kidney failure, unspecified: Secondary | ICD-10-CM | POA: Diagnosis present

## 2011-05-07 DIAGNOSIS — Z7982 Long term (current) use of aspirin: Secondary | ICD-10-CM

## 2011-05-07 DIAGNOSIS — Z87891 Personal history of nicotine dependence: Secondary | ICD-10-CM

## 2011-05-07 HISTORY — DX: Reserved for concepts with insufficient information to code with codable children: IMO0002

## 2011-05-07 HISTORY — DX: Pseudocyst of pancreas: K86.3

## 2011-05-07 HISTORY — DX: Disorientation, unspecified: R41.0

## 2011-05-07 HISTORY — DX: Diverticulosis of intestine, part unspecified, without perforation or abscess without bleeding: K57.90

## 2011-05-07 HISTORY — DX: Rheumatoid arthritis, unspecified: M06.9

## 2011-05-07 HISTORY — PX: CYSTOSCOPY: SHX5120

## 2011-05-07 LAB — BASIC METABOLIC PANEL
Chloride: 96 mEq/L (ref 96–112)
GFR calc Af Amer: 5 mL/min — ABNORMAL LOW (ref >90)
GFR calc non Af Amer: 5 mL/min — ABNORMAL LOW (ref >90)
Potassium: 5.9 mEq/L — ABNORMAL HIGH (ref 3.5–5.1)
Sodium: 140 mEq/L (ref 135–145)

## 2011-05-07 LAB — DIFFERENTIAL
Basophils Absolute: 0 10*3/uL (ref 0.0–0.1)
Eosinophils Absolute: 0 10*3/uL (ref 0.0–0.7)
Eosinophils Relative: 0 % (ref 0–5)
Lymphocytes Relative: 4 % — ABNORMAL LOW (ref 12–46)
Neutrophils Relative %: 84 % — ABNORMAL HIGH (ref 43–77)

## 2011-05-07 LAB — COMPREHENSIVE METABOLIC PANEL
ALT: 13 U/L (ref 0–35)
AST: 27 U/L (ref 0–37)
Albumin: 3.2 g/dL — ABNORMAL LOW (ref 3.5–5.2)
Albumin: 3.1 g/dL — ABNORMAL LOW (ref 3.5–5.2)
Alkaline Phosphatase: 59 U/L (ref 39–117)
BUN: 83 mg/dL — ABNORMAL HIGH (ref 6–23)
CO2: 13 mEq/L — ABNORMAL LOW (ref 19–32)
Calcium: 8.9 mg/dL (ref 8.4–10.5)
Chloride: 95 mEq/L — ABNORMAL LOW (ref 96–112)
Creatinine, Ser: 8.02 mg/dL — ABNORMAL HIGH (ref 0.50–1.10)
GFR calc non Af Amer: 4 mL/min — ABNORMAL LOW (ref >90)
Glucose, Bld: 59 mg/dL — ABNORMAL LOW (ref 70–99)
Potassium: 6.3 mEq/L (ref 3.5–5.1)
Sodium: 138 mEq/L (ref 135–145)
Total Bilirubin: 0.2 mg/dL — ABNORMAL LOW (ref 0.3–1.2)
Total Protein: 6.3 g/dL (ref 6.0–8.3)

## 2011-05-07 LAB — POCT I-STAT 3, ART BLOOD GAS (G3+)
Bicarbonate: 11 mEq/L — ABNORMAL LOW (ref 20.0–24.0)
Patient temperature: 98.6
pH, Arterial: 7.275 — ABNORMAL LOW (ref 7.350–7.400)

## 2011-05-07 LAB — URINALYSIS, ROUTINE W REFLEX MICROSCOPIC
Protein, ur: >300 mg/dL — AB
Urobilinogen, UA: 1 mg/dL (ref 0.0–1.0)

## 2011-05-07 LAB — POCT I-STAT 4, (NA,K, GLUC, HGB,HCT)
Glucose, Bld: 101 mg/dL — ABNORMAL HIGH (ref 70–99)
HCT: 40 % (ref 36.0–46.0)
Sodium: 138 mEq/L (ref 135–145)

## 2011-05-07 LAB — CBC
MCV: 92.4 fL (ref 78.0–100.0)
Platelets: 293 10*3/uL (ref 150–400)
RDW: 14.1 % (ref 11.5–15.5)
WBC: 15 10*3/uL — ABNORMAL HIGH (ref 4.0–10.5)

## 2011-05-07 LAB — PROCALCITONIN: Procalcitonin: 0.24 ng/mL

## 2011-05-07 LAB — GLUCOSE, CAPILLARY: Glucose-Capillary: 39 mg/dL — CL (ref 70–99)

## 2011-05-07 LAB — URINE MICROSCOPIC-ADD ON

## 2011-05-07 LAB — RAPID URINE DRUG SCREEN, HOSP PERFORMED
Cocaine: NOT DETECTED
Opiates: POSITIVE — AB

## 2011-05-07 LAB — LIPASE, BLOOD: Lipase: 51 U/L (ref 11–59)

## 2011-05-07 LAB — LACTIC ACID, PLASMA: Lactic Acid, Venous: 8.8 mmol/L — ABNORMAL HIGH (ref 0.5–2.2)

## 2011-05-07 LAB — TROPONIN I: Troponin I: <0.3 ng/mL (ref <0.30)

## 2011-05-07 SURGERY — CYSTOSCOPY
Anesthesia: General | Site: Ureter | Laterality: Left | Wound class: Clean Contaminated

## 2011-05-07 MED ORDER — DEXTROSE 50 % IV SOLN
50.0000 mL | Freq: Once | INTRAVENOUS | Status: AC | PRN
  Filled 2011-05-07: qty 50

## 2011-05-07 MED ORDER — DEXTROSE 250 MG/ML IV SOLN: 12.5000 g | Freq: Once | INTRAVENOUS | Status: DC

## 2011-05-07 MED ORDER — STERILE WATER FOR IRRIGATION IR SOLN
Status: DC | PRN
  Administered 2011-05-07: 1

## 2011-05-07 MED ORDER — SODIUM CHLORIDE 0.9 % IJ SOLN
3.0000 mL | Freq: Two times a day (BID) | INTRAMUSCULAR | Status: DC
  Administered 2011-05-07 – 2011-05-11 (×8): 3 mL via INTRAVENOUS

## 2011-05-07 MED ORDER — ONDANSETRON HCL 4 MG/2ML IJ SOLN: 4.0000 mg | Freq: Four times a day (QID) | INTRAMUSCULAR | Status: DC | PRN

## 2011-05-07 MED ORDER — ONDANSETRON HCL 4 MG/2ML IJ SOLN
INTRAMUSCULAR | Status: DC | PRN
  Administered 2011-05-07: 4 mg via INTRAVENOUS

## 2011-05-07 MED ORDER — IOHEXOL 300 MG/ML  SOLN
INTRAMUSCULAR | Status: DC | PRN
  Administered 2011-05-07: 4 mL via INTRAVENOUS

## 2011-05-07 MED ORDER — ONDANSETRON HCL 4 MG PO TABS
4.0000 mg | ORAL_TABLET | Freq: Four times a day (QID) | ORAL | Status: DC | PRN
  Administered 2011-05-09: 4 mg via ORAL
  Filled 2011-05-07: qty 1

## 2011-05-07 MED ORDER — DEXTROSE 50 % IV SOLN
1.0000 | Freq: Once | INTRAVENOUS | Status: AC
  Administered 2011-05-07: 50 mL via INTRAVENOUS

## 2011-05-07 MED ORDER — SODIUM BICARBONATE 8.4 % IV SOLN
100.0000 meq | Freq: Once | INTRAVENOUS | Status: AC
  Administered 2011-05-07: 100 meq via INTRAVENOUS
  Filled 2011-05-07: qty 100

## 2011-05-07 MED ORDER — HYDRALAZINE HCL 20 MG/ML IJ SOLN
10.0000 mg | Freq: Four times a day (QID) | INTRAMUSCULAR | Status: DC | PRN
  Filled 2011-05-07: qty 0.5

## 2011-05-07 MED ORDER — DEXTROSE 50 % IV SOLN
INTRAVENOUS | Status: AC
  Administered 2011-05-07: 11:00:00
  Filled 2011-05-07: qty 50

## 2011-05-07 MED ORDER — PROPOFOL 10 MG/ML IV EMUL
INTRAVENOUS | Status: DC | PRN
  Administered 2011-05-07: 10 mg via INTRAVENOUS

## 2011-05-07 MED ORDER — DEXTROSE-NACL 5-0.9 % IV SOLN
INTRAVENOUS | Status: DC
  Administered 2011-05-07: 13:00:00 via INTRAVENOUS

## 2011-05-07 MED ORDER — ALBUTEROL SULFATE (5 MG/ML) 0.5% IN NEBU: 2.5000 mg | INHALATION_SOLUTION | RESPIRATORY_TRACT | Status: DC | PRN

## 2011-05-07 MED ORDER — HYDROCORTISONE SOD SUCCINATE 100 MG IJ SOLR
50.0000 mg | Freq: Two times a day (BID) | INTRAMUSCULAR | Status: DC
  Administered 2011-05-07 – 2011-05-08 (×2): 50 mg via INTRAVENOUS
  Filled 2011-05-07 (×4): qty 1

## 2011-05-07 MED ORDER — ONDANSETRON HCL 4 MG/2ML IJ SOLN
4.0000 mg | Freq: Once | INTRAMUSCULAR | Status: DC
  Filled 2011-05-07: qty 2

## 2011-05-07 MED ORDER — SODIUM POLYSTYRENE SULFONATE 15 GM/60ML PO SUSP: 30.0000 g | Freq: Once | ORAL | Status: AC

## 2011-05-07 MED ORDER — ACETAMINOPHEN 650 MG RE SUPP: 650.0000 mg | Freq: Four times a day (QID) | RECTAL | Status: DC | PRN

## 2011-05-07 MED ORDER — DEXTROSE 50 % IV SOLN
INTRAVENOUS | Status: AC
  Filled 2011-05-07: qty 50

## 2011-05-07 MED ORDER — SODIUM POLYSTYRENE SULFONATE 15 GM/60ML PO SUSP
30.0000 g | Freq: Once | ORAL | Status: AC
  Administered 2011-05-07: 30 g via RECTAL

## 2011-05-07 MED ORDER — DEXTROSE 50 % IV SOLN
INTRAVENOUS | Status: DC | PRN
  Administered 2011-05-07: 12.5 g via INTRAVENOUS

## 2011-05-07 MED ORDER — SODIUM CHLORIDE 0.9 % IV BOLUS (SEPSIS)
500.0000 mL | Freq: Once | INTRAVENOUS | Status: AC
  Administered 2011-05-07: 500 mL via INTRAVENOUS

## 2011-05-07 MED ORDER — METOPROLOL TARTRATE 25 MG PO TABS
25.0000 mg | ORAL_TABLET | Freq: Two times a day (BID) | ORAL | Status: DC
  Administered 2011-05-07 – 2011-05-13 (×12): 25 mg via ORAL
  Filled 2011-05-07 (×13): qty 1

## 2011-05-07 MED ORDER — SODIUM CHLORIDE 0.9 % IV SOLN
INTRAVENOUS | Status: DC
  Administered 2011-05-07: 11:00:00 via INTRAVENOUS

## 2011-05-07 MED ORDER — FENTANYL CITRATE 0.05 MG/ML IJ SOLN
50.0000 ug | Freq: Once | INTRAMUSCULAR | Status: DC
  Filled 2011-05-07: qty 2

## 2011-05-07 MED ORDER — FENTANYL CITRATE 0.05 MG/ML IJ SOLN
INTRAMUSCULAR | Status: DC | PRN
  Administered 2011-05-07 (×2): 25 ug via INTRAVENOUS

## 2011-05-07 MED ORDER — SODIUM BICARBONATE 8.4 % IV SOLN
INTRAVENOUS | Status: DC
  Administered 2011-05-07: 22:00:00 via INTRAVENOUS
  Filled 2011-05-07 (×3): qty 100

## 2011-05-07 MED ORDER — DEXTROSE 5 % IV SOLN
1.0000 g | INTRAVENOUS | Status: DC
  Administered 2011-05-07 – 2011-05-12 (×6): 1 g via INTRAVENOUS
  Filled 2011-05-07 (×7): qty 10

## 2011-05-07 MED ORDER — FENTANYL CITRATE 0.05 MG/ML IJ SOLN: 25.0000 ug | INTRAMUSCULAR | Status: DC | PRN

## 2011-05-07 MED ORDER — SODIUM POLYSTYRENE SULFONATE 15 GM/60ML PO SUSP
30.0000 g | Freq: Once | ORAL | Status: DC
  Filled 2011-05-07: qty 120

## 2011-05-07 MED ORDER — LATANOPROST 0.005 % OP SOLN
1.0000 [drp] | Freq: Every day | OPHTHALMIC | Status: DC
  Administered 2011-05-07 – 2011-05-12 (×6): 1 [drp] via OPHTHALMIC
  Filled 2011-05-07: qty 2.5

## 2011-05-07 MED ORDER — METOCLOPRAMIDE HCL 5 MG/ML IJ SOLN: 10.0000 mg | Freq: Once | INTRAMUSCULAR | Status: DC | PRN

## 2011-05-07 MED ORDER — CIPROFLOXACIN IN D5W 400 MG/200ML IV SOLN
INTRAVENOUS | Status: DC | PRN
  Administered 2011-05-07: 200 mg via INTRAVENOUS

## 2011-05-07 MED ORDER — MORPHINE SULFATE 2 MG/ML IJ SOLN: 2.0000 mg | INTRAMUSCULAR | Status: DC | PRN

## 2011-05-07 MED ORDER — ACETAMINOPHEN 325 MG PO TABS: 650.0000 mg | ORAL_TABLET | Freq: Four times a day (QID) | ORAL | Status: DC | PRN

## 2011-05-07 MED ORDER — ONDANSETRON HCL 4 MG/2ML IJ SOLN
4.0000 mg | Freq: Once | INTRAMUSCULAR | Status: AC
  Administered 2011-05-07: 4 mg via INTRAVENOUS

## 2011-05-07 SURGICAL SUPPLY — 27 items
ADAPTER CATH URET PLST 4-6FR (CATHETERS) ×2 IMPLANT
BAG URINE DRAINAGE (UROLOGICAL SUPPLIES) ×2 IMPLANT
BAG URO CATCHER STRL LF (DRAPE) IMPLANT
BENZOIN TINCTURE PRP APPL 2/3 (GAUZE/BANDAGES/DRESSINGS) IMPLANT
BLADE SURG ROTATE 9660 (MISCELLANEOUS) IMPLANT
BUCKET BIOHAZARD WASTE 5 GAL (MISCELLANEOUS) IMPLANT
CATH FOLEY 2WAY SLVR  5CC 16FR (CATHETERS)
CATH FOLEY 2WAY SLVR 5CC 16FR (CATHETERS) IMPLANT
CATH URET 5FR 28IN CONE TIP (BALLOONS)
CATH URET 5FR 70CM CONE TIP (BALLOONS) IMPLANT
CLOTH BEACON ORANGE TIMEOUT ST (SAFETY) ×2 IMPLANT
COVER SURGICAL LIGHT HANDLE (MISCELLANEOUS) ×2 IMPLANT
DRAPE CAMERA CLOSED 9X96 (DRAPES) ×2 IMPLANT
GOWN STRL NON-REIN LRG LVL3 (GOWN DISPOSABLE) ×4 IMPLANT
GUIDEWIRE COOK  .035 (WIRE) IMPLANT
H R LUBE JELLY XXX (MISCELLANEOUS) ×2 IMPLANT
KIT ROOM TURNOVER OR (KITS) ×2 IMPLANT
NS IRRIG 1000ML POUR BTL (IV SOLUTION) ×2 IMPLANT
PACK CYSTOSCOPY (CUSTOM PROCEDURE TRAY) ×2 IMPLANT
PAD ARMBOARD 7.5X6 YLW CONV (MISCELLANEOUS) ×4 IMPLANT
PLUG CATH AND CAP STER (CATHETERS) IMPLANT
STENT URET 6FRX24 CONTOUR (STENTS) ×2 IMPLANT
SYR CONTROL 10ML LL (SYRINGE) ×2 IMPLANT
SYRINGE TOOMEY DISP (SYRINGE) IMPLANT
UNDERPAD 30X30 INCONTINENT (UNDERPADS AND DIAPERS) ×2 IMPLANT
WATER STERILE IRR 1000ML POUR (IV SOLUTION) ×2 IMPLANT
WIRE COONS/BENSON .038X145CM (WIRE) IMPLANT

## 2011-05-07 NOTE — Transfer of Care (Signed)
Immediate Anesthesia Transfer of Care Note  Patient: [name]  Procedure(s) Performed: Procedure(s) (LRB): CYSTOSCOPY WITH STENT PLACEMENT (Left) CYSTOSCOPY (Left)  Patient Location: PACU  Anesthesia Type: MAC  Level of Consciousness: awake, alert  and oriented  Airway & Oxygen Therapy: Patient Spontanous Breathing  Post-op Assessment: Report given to PACU RN and Post -op Vital signs reviewed and stable  Post vital signs: Reviewed and stable  Complications: No apparent anesthesia complications

## 2011-05-07 NOTE — ED Notes (Signed)
cbg 117.  Rn/julie informed.

## 2011-05-07 NOTE — Consult Note (Signed)
Urology Consult   Physician requesting consult: Dr. Eden Emms  Reason for consult: Acute renal failure with left ureteral stone and solitary left kidney  History of Present Illness: [name] is a 78 y.o. patient of Dr. 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  She has no history of urolithiasis but developed severe left flank pain 3 days ago and presented to the ED for evaluation.  She was thought to probably have pain related to her history of arthritis and was discharged home.  She was found this morning unarousable by her son who lives with her.  She was transported to the ED and found to be in acute renal failure with a creatinine over 8. A CT scan without contrast was performed and she was found to have a 6 mm obstructing stone of the left ureter.  She has been voiding some urine as recently as yesterday.  She denies fever, nausea, vomiting, or hematuria.   Past Medical History  Diagnosis Date  . Pacemaker   . CAD (coronary artery disease)   . CHB (complete heart block)   . HTN (hypertension), benign   . DM (diabetes mellitus)   . Hypertrophic cardiomyopathy   . Hyperlipidemia   . OP (osteoporosis)   . Hx: UTI (urinary tract infection)   . Fatigue   . Rheumatoid arthritis   . Chronic steroid use   . Confusion     Past Surgical History  Procedure Date  . Insert / replace / remove pacemaker   . Cardiac catheterization 04/17/2005    EF 65%  . Septal myectomy   . Nephrectomy     RIGHT  . Arm fracture surgery     RIGHT ARM  . Coronary artery bypass graft 04/2005    LIMA GRAFT TO THE DISTAL LAD, SAPHENOUS VEIN GRAFT TO THE DIAGONAL, SAPHENOUS VEIN GRAFT TO THE INTERMEDIATE BRANCH, AND SAPHENOUS VEIN GRAFT TO THE DISTAL RIGHT CORONARY  . Myomectomy   . Cataract extraction, bilateral      Current Hospital Medications:  Prior to Admission medications   Medication Sig Start Date  End Date Taking? Authorizing Provider  amitriptyline-chlordiazePOXIDE (LIMBITROL) 12.5-5 MG per tablet Take 1 tablet by mouth at bedtime.   Yes Historical Provider, MD  aspirin EC 81 MG tablet Take 81 mg by mouth daily.   Yes Historical Provider, MD  Calcium Carbonate (CALCIUM 500 PO) Take 1 tablet by mouth daily.   Yes Historical Provider, MD  clopidogrel (PLAVIX) 75 MG tablet Take 75 mg by mouth daily.   Yes Historical Provider, MD  estradiol (ESTRING) 2 MG vaginal ring Place 2 mg vaginally every 3 (three) months. follow package directions   Yes Historical Provider, MD  glimepiride (AMARYL) 4 MG tablet Take 2 mg by mouth daily before breakfast.     Yes Historical Provider, MD  HYDROcodone-acetaminophen (VICODIN) 5-500 MG per tablet Take 1 tablet by mouth every 6 (six) hours as needed. For pain.   Yes Historical Provider, MD  Lactobacillus (ACIDOPHILUS PO) Take 460 mg by mouth daily.   Yes Historical Provider, MD  latanoprost (XALATAN) 0.005 % ophthalmic solution Place 1 drop into the left eye daily.   Yes Historical Provider, MD  lovastatin (MEVACOR) 20 MG tablet Take 20 mg by mouth every evening.   Yes Historical Provider, MD  metFORMIN (GLUCOPHAGE) 1000 MG tablet Take 1,000 mg by mouth 2 (two) times daily.  Yes Historical Provider, MD  metoprolol (LOPRESSOR) 50 MG tablet Take 25 mg by mouth 2 (two) times daily.    Yes Historical Provider, MD  Multiple Vitamin (MULITIVITAMIN WITH MINERALS) TABS Take 1 tablet by mouth daily.   Yes Historical Provider, MD  Multiple Vitamins-Minerals (ICAPS PO) Take 1 capsule by mouth daily.   Yes Historical Provider, MD  niacin 500 MG tablet Take 500 mg by mouth 2 (two) times daily.   Yes Historical Provider, MD  Polyethyl Glycol-Propyl Glycol (SYSTANE OP) Apply 1-2 drops to eye as needed. For eye irritation   Yes Historical Provider, MD  predniSONE (DELTASONE) 5 MG tablet Take 5 mg by mouth daily.    Yes Historical Provider, MD  raloxifene (EVISTA) 60 MG tablet  Take 60 mg by mouth daily.     Yes Historical Provider, MD  ranitidine (ZANTAC) 300 MG tablet Take 150 mg by mouth 2 (two) times daily.   Yes Historical Provider, MD  trimethoprim (TRIMPEX) 100 MG tablet Take 100 mg by mouth every evening.    Yes Historical Provider, MD  Vitamin D, Ergocalciferol, (DRISDOL) 50000 UNITS CAPS Take 50,000 Units by mouth every 14 (fourteen) days.   Yes Historical Provider, MD  nitroGLYCERIN (NITROSTAT) 0.4 MG SL tablet Place 0.4 mg under the tongue every 5 (five) minutes as needed. For chest pain. 10/02/10 10/02/11  Peter M [place], MD   Scheduled Meds:   . dextrose  1 ampule Intravenous Once  . dextrose  1 ampule Intravenous Once  . dextrose      . ondansetron (ZOFRAN) IV  4 mg Intravenous Once  . sodium bicarbonate  100 mEq Intravenous Once  . sodium chloride  500 mL Intravenous Once  . sodium polystyrene  30 g Rectal Once  . DISCONTD: dextrose  12.5 g Intravenous Once  . DISCONTD: dextrose      . DISCONTD: fentaNYL  50 mcg Intravenous Once  . DISCONTD: ondansetron  4 mg Intravenous Once  . DISCONTD: sodium polystyrene  30 g Oral Once   Continuous Infusions:   . sodium chloride 75 mL/hr at 05/07/11 1053  . dextrose 5 % and 0.9% NaCl 75 mL/hr at 05/07/11 1243   PRN Meds:.  Allergies:  Allergies  Allergen Reactions  . Codeine Other (See Comments)    Blacks out    Family History  Problem Relation Age of Onset  . Lung cancer Mother   . Cancer Father   . Heart attack Brother     Social History:  reports that she quit smoking about 21 years ago. Her smoking use included Cigarettes. She smoked 1 pack per day. She has never used smokeless tobacco. She reports that she does not drink alcohol or use illicit drugs.  ROS: A complete review of systems was performed.  All systems are negative except for pertinent findings as noted.  Physical Exam:  Vital signs in last 24 hours: Temp:  [98.1 F (36.7 C)-98.5 F (36.9 C)] 98.5 F (36.9 C) (03/27  1204) Pulse Rate:  [80-94] 94  (03/27 1742) Resp:  [21-30] 30  (03/27 1742) BP: (159-185)/(82-109) 185/83 mmHg (03/27 1742) SpO2:  [94 %-99 %] 95 % (03/27 1742) General:  Alert and oriented, No acute distress HEENT: Normocephalic, atraumatic Neck: No JVD or lymphadenopathy Cardiovascular: Regular rate and rhythm Lungs: Clear bilaterally Abdomen: TTP in left upper quadrant. No rebound tenderness or guarding Back: Moderate left CVAT Neurologic: Grossly intact  Laboratory Data:   Basename 05/07/11 1055  WBC 15.0*  HGB 13.8  HCT 41.5  PLT 293     Basename 05/07/11 1452 05/07/11 1055  NA 138 138  K 7.0* 6.3*  CL 99 95*  GLUCOSE 28* 59*  BUN 87* 83*  CALCIUM 8.9 9.2  CREATININE 8.13* 8.02*     Results for orders placed during the hospital encounter of 05/07/11 (from the past 24 hour(s))  GLUCOSE, CAPILLARY     Status: Abnormal   Collection Time   05/07/11 10:39 AM      Component Value Range   Glucose-Capillary 68 (*) 70 - 99 (mg/dL)   Comment 1 Orig Pt Id entered as [id number]    COMPREHENSIVE METABOLIC PANEL     Status: Abnormal   Collection Time   05/07/11 10:55 AM      Component Value Range   Sodium 138  135 - 145 (mEq/L)   Potassium 6.3 (*) 3.5 - 5.1 (mEq/L)   Chloride 95 (*) 96 - 112 (mEq/L)   CO2 13 (*) 19 - 32 (mEq/L)   Glucose, Bld 59 (*) 70 - 99 (mg/dL)   BUN 83 (*) 6 - 23 (mg/dL)   Creatinine, Ser 1.47 (*) 0.50 - 1.10 (mg/dL)   Calcium 9.2  8.4 - 82.9 (mg/dL)   Total Protein 6.7  6.0 - 8.3 (g/dL)   Albumin 3.2 (*) 3.5 - 5.2 (g/dL)   AST 25  0 - 37 (U/L)   ALT 14  0 - 35 (U/L)   Alkaline Phosphatase 59  39 - 117 (U/L)   Total Bilirubin 0.2 (*) 0.3 - 1.2 (mg/dL)   GFR calc non Af Amer 4 (*) >90 (mL/min)   GFR calc Af Amer 5 (*) >90 (mL/min)  CBC     Status: Abnormal   Collection Time   05/07/11 10:55 AM      Component Value Range   WBC 15.0 (*) 4.0 - 10.5 (K/uL)   RBC 4.49  3.87 - 5.11 (MIL/uL)   Hemoglobin 13.8  12.0 - 15.0 (g/dL)   HCT 56.2  13.0 -  86.5 (%)   MCV 92.4  78.0 - 100.0 (fL)   MCH 30.7  26.0 - 34.0 (pg)   MCHC 33.3  30.0 - 36.0 (g/dL)   RDW 78.4  69.6 - 29.5 (%)   Platelets 293  150 - 400 (K/uL)  DIFFERENTIAL     Status: Abnormal   Collection Time   05/07/11 10:55 AM      Component Value Range   Neutrophils Relative 84 (*) 43 - 77 (%)   Neutro Abs 12.6 (*) 1.7 - 7.7 (K/uL)   Lymphocytes Relative 4 (*) 12 - 46 (%)   Lymphs Abs 0.6 (*) 0.7 - 4.0 (K/uL)   Monocytes Relative 12  3 - 12 (%)   Monocytes Absolute 1.8 (*) 0.1 - 1.0 (K/uL)   Eosinophils Relative 0  0 - 5 (%)   Eosinophils Absolute 0.0  0.0 - 0.7 (K/uL)   Basophils Relative 0  0 - 1 (%)   Basophils Absolute 0.0  0.0 - 0.1 (K/uL)  LIPASE, BLOOD     Status: Normal   Collection Time   05/07/11 10:55 AM      Component Value Range   Lipase 51  11 - 59 (U/L)  PROCALCITONIN     Status: Normal   Collection Time   05/07/11 10:55 AM      Component Value Range   Procalcitonin 0.24    GLUCOSE, CAPILLARY     Status: Abnormal   Collection Time  05/07/11 11:48 AM      Component Value Range   Glucose-Capillary 39 (*) 70 - 99 (mg/dL)   Comment 1 Notify RN    URINE RAPID DRUG SCREEN (HOSP PERFORMED)     Status: Abnormal   Collection Time   05/07/11 12:04 PM      Component Value Range   Opiates POSITIVE (*) NONE DETECTED    Cocaine NONE DETECTED  NONE DETECTED    Benzodiazepines POSITIVE (*) NONE DETECTED    Amphetamines NONE DETECTED  NONE DETECTED    Tetrahydrocannabinol NONE DETECTED  NONE DETECTED    Barbiturates NONE DETECTED  NONE DETECTED   URINALYSIS, ROUTINE W REFLEX MICROSCOPIC     Status: Abnormal   Collection Time   05/07/11 12:04 PM      Component Value Range   Color, Urine BROWN (*) YELLOW    APPearance CLOUDY (*) CLEAR    Specific Gravity, Urine 1.025  1.005 - 1.030    pH 6.5  5.0 - 8.0    Glucose, UA NEGATIVE  NEGATIVE (mg/dL)   Hgb urine dipstick LARGE (*) NEGATIVE    Bilirubin Urine MODERATE (*) NEGATIVE    Ketones, ur 15 (*) NEGATIVE  (mg/dL)   Protein, ur >161 (*) NEGATIVE (mg/dL)   Urobilinogen, UA 1.0  0.0 - 1.0 (mg/dL)   Nitrite POSITIVE (*) NEGATIVE    Leukocytes, UA TRACE (*) NEGATIVE   URINE MICROSCOPIC-ADD ON     Status: Abnormal   Collection Time   05/07/11 12:04 PM      Component Value Range   Squamous Epithelial / LPF FEW (*) RARE    WBC, UA 0-2  <3 (WBC/hpf)   RBC / HPF TOO NUMEROUS TO COUNT  <3 (RBC/hpf)   Bacteria, UA RARE  RARE    Casts GRANULAR CAST (*) NEGATIVE    Urine-Other MUCOUS PRESENT    GLUCOSE, CAPILLARY     Status: Abnormal   Collection Time   05/07/11 12:30 PM      Component Value Range   Glucose-Capillary 117 (*) 70 - 99 (mg/dL)  LACTIC ACID, PLASMA     Status: Abnormal   Collection Time   05/07/11  1:20 PM      Component Value Range   Lactic Acid, Venous 8.8 (*) 0.5 - 2.2 (mmol/L)  POCT I-STAT 3, BLOOD GAS (G3+)     Status: Abnormal   Collection Time   05/07/11  1:52 PM      Component Value Range   pH, Arterial 7.275 (*) 7.350 - 7.400    pCO2 arterial 23.7 (*) 35.0 - 45.0 (mmHg)   pO2, Arterial 86.0  80.0 - 100.0 (mmHg)   Bicarbonate 11.0 (*) 20.0 - 24.0 (mEq/L)   TCO2 12  0 - 100 (mmol/L)   O2 Saturation 95.0     Acid-base deficit 14.0 (*) 0.0 - 2.0 (mmol/L)   Patient temperature 98.6 F     Collection site RADIAL, ALLEN'S TEST ACCEPTABLE     Drawn by Operator     Sample type ARTERIAL    COMPREHENSIVE METABOLIC PANEL     Status: Abnormal   Collection Time   05/07/11  2:52 PM      Component Value Range   Sodium 138  135 - 145 (mEq/L)   Potassium 7.0 (*) 3.5 - 5.1 (mEq/L)   Chloride 99  96 - 112 (mEq/L)   CO2 14 (*) 19 - 32 (mEq/L)   Glucose, Bld 28 (*) 70 - 99 (mg/dL)   BUN  87 (*) 6 - 23 (mg/dL)   Creatinine, Ser 0.45 (*) 0.50 - 1.10 (mg/dL)   Calcium 8.9  8.4 - 40.9 (mg/dL)   Total Protein 6.3  6.0 - 8.3 (g/dL)   Albumin 3.1 (*) 3.5 - 5.2 (g/dL)   AST 27  0 - 37 (U/L)   ALT 13  0 - 35 (U/L)   Alkaline Phosphatase 56  39 - 117 (U/L)   Total Bilirubin 0.2 (*) 0.3 - 1.2  (mg/dL)   GFR calc non Af Amer 4 (*) >90 (mL/min)   GFR calc Af Amer 5 (*) >90 (mL/min)  ACETAMINOPHEN LEVEL     Status: Normal   Collection Time   05/07/11  2:52 PM      Component Value Range   Acetaminophen (Tylenol), Serum <15.0  10 - 30 (ug/mL)  SALICYLATE LEVEL     Status: Abnormal   Collection Time   05/07/11  2:52 PM      Component Value Range   Salicylate Lvl <2.0 (*) 2.8 - 20.0 (mg/dL)   No results found for this or any previous visit (from the past 240 hour(s)).  Renal Function:  Basename 05/07/11 1452 05/07/11 1055  CREATININE 8.13* 8.02*   The CrCl is unknown because both a height and weight (above a minimum accepted value) are required for this calculation.  Radiologic Imaging: Ct Abdomen Pelvis Wo Contrast  05/07/2011  *RADIOLOGY REPORT*  Clinical Data: Acute renal failure.  Abdominal pain  CT ABDOMEN AND PELVIS WITHOUT CONTRAST  Technique:  Multidetector CT imaging of the abdomen and pelvis was performed following the standard protocol without intravenous contrast.  Comparison: CT 10/26/2008  Findings: Obstruction of the left kidney with hydronephrosis and perinephric stranding.  7 mm stone in the proximal left ureter causing high grade obstruction of the left kidney.  Right kidney has been removed.  Lung bases are clear.  15 mm hypodensity in the left lobe of the liver is unchanged from the  prior study and is compatible with a cyst.  No new liver lesions are seen.  Gallstones are present layering in the gallbladder.  Bile ducts are not dilated.  Pancreas and spleen are normal.  Negative for bowel obstruction.  Sigmoid diverticulosis is present. Pessary is present in the vagina.  Chronic fracture of L1. Degenerative changes in the lumbar spine. Atherosclerotic aorta without aneurysm.  IMPRESSION: High-grade obstruction of the left kidney due to a 7 mm stone in the proximal left ureter.  Right kidney is surgically absent.  Original Report Authenticated By: Camelia Phenes, M.D.    Dg Chest 2 View  05/07/2011  *RADIOLOGY REPORT*  Clinical Data: Confusion, shortness of breath, history hypertension, diabetes, coronary artery disease, hypertrophic cardiomyopathy  CHEST - 2 VIEW  Comparison: 12/29/2008  Findings: Left subclavian sequential transvenous pacemaker leads project at right atrium and right ventricle. Normal heart size post CABG. Minimally prominent left superior mediastinal soft tissues stable, question related to tortuous innominate artery. Atherosclerotic calcification aorta. Pulmonary vascularity normal. Chronic elevation right diaphragm. Minimal bibasilar atelectasis. Aeration at left base improved since previous exam. Upper lungs clear. Bones diffusely demineralized.  IMPRESSION: Post CABG and pacemaker. Minimal bibasilar atelectasis and chronic elevation right diaphragm.  Original Report Authenticated By: Lollie Marrow, M.D.    I independently reviewed the above imaging studies.  Impression/Assessment:  Acute renal failure with obstructing left ureteral stone with a solitary left kidney  Plan:  Since she is on Plavix, percutaneous nephrostomy drainage would be with significant risk  of bleeding.  I have recommended proceeding with cystoscopy and left ureteral stent placement. I discussed the potential benefits and risks of the procedure, side effects of the proposed treatment, the likelihood of the patient achieving the goals of the procedure, and any potential problems that might occur during the procedure or recuperation.  She and her son give informed consent to proceed.  This will be performed tonight on an urgent basis.  She will be at risk for post-obstructive diuresis and will need monitoring of her electrolytes.  She will eventually need definitive treatment of her stone once she has recovered from her acute issues.  Dr. Patsi Sears will be notified of her admission and will follow her during her hospitalization.  Loetta Connelley,LES 05/07/2011, 5:49 PM  Moody Bruins. MD

## 2011-05-07 NOTE — ED Notes (Signed)
Pt lives at home with her son and when he checked on her this morning he could no arouse her. ems was notified and cbg upon their arrival was 61. Iv started and amp of d50 was given and cbg was 95 upon ems arrival of transport to hospital. Pt is alert and oriented but slow to respond at times. She states that she has been having increasing lower abdominal pain and that is why she did not eat last night after taking her metformin. Pt states that she has also been having left hip pain and was evaluated for the same complaint recently at this hospital. Pt was given peanut butter in attempts to raise her blood sugar and pts airway intact but she states that she just feels like she cannot swallow very well.

## 2011-05-07 NOTE — OR Nursing (Signed)
Low blood sugar being treated now with oral juices

## 2011-05-07 NOTE — Anesthesia Postprocedure Evaluation (Signed)
  Anesthesia Post-op Note  Patient: [name]  Procedure(s) Performed: Procedure(s) (LRB): CYSTOSCOPY WITH STENT PLACEMENT (Left) CYSTOSCOPY (Left)  Patient Location: PACU  Anesthesia Type: MAC  Level of Consciousness: awake, alert  and oriented  Airway and Oxygen Therapy: Patient Spontanous Breathing  Post-op Pain: none  Post-op Assessment: Post-op Vital signs reviewed, Patient's Cardiovascular Status Stable, Respiratory Function Stable and Patent Airway  Post-op Vital Signs: Reviewed and stable  Complications: No apparent anesthesia complications

## 2011-05-07 NOTE — Op Note (Addendum)
Preoperative diagnosis:  1. Acute renal failure  2. Left ureteral obstruction  3. Left ureteral stone 4. Solitary left kidney  Postoperative diagnosis:  1. Acute renal failure  2. Left ureteral obstruction  3. Left ureteral stone  4. Solitary left kidney  Procedure:  1. Cystoscopy 2. Left ureteral stent placement (6 x 24)  3. Left retrograde pyelography with interpretation   Surgeon: Moody Bruins. M.D.  Anesthesia: IV sedation  Complications: None  Intraoperative findings: Left RPG demonstrated a filling defect in the proximal left ureter consistent with the patient's known stone.  EBL: Minimal  Specimens: None  Indication: [name] is a 78 y.o. patient with acute renal failure due to an obstructing left ureteral stone in a solitary left kidney. After reviewing the management options for treatment, he elected to proceed with the above surgical procedure(s). We have discussed the potential benefits and risks of the procedure, side effects of the proposed treatment, the likelihood of the patient achieving the goals of the procedure, and any potential problems that might occur during the procedure or recuperation. Informed consent has been obtained.  Description of procedure:  The patient was taken to the operating room and general anesthesia was induced.  The patient was placed in the dorsal lithotomy position, prepped and draped in the usual sterile fashion, and preoperative antibiotics were administered. A preoperative time-out was performed.   Cystourethroscopy was performed.  The patient's urethra was examined and was normal. The bladder was then systematically examined in its entirety. There was no evidence for any bladder tumors, stones, or other mucosal pathology.    Attention then turned to the left ureteral orifice and a ureteral catheter was used to intubate the ureteral orifice.  Omnipaque contrast was injected through the ureteral catheter and a retrograde  pyelogram was performed with findings as dictated above.  A 0.38 sensor guidewire was then advanced up the left ureter into the renal pelvis under fluoroscopic guidance.  The wire was then backloaded through the cystoscope and a ureteral stent was advance over the wire using Seldinger technique.  The stent was positioned appropriately under fluoroscopic and cystoscopic guidance.  The wire was then removed with an adequate stent curl noted in the renal pelvis as well as in the bladder.  The bladder was then emptied and the procedure ended.  The patient appeared to tolerate the procedure well and without complications.  The patient was able to be awakened and transferred to the recovery unit in satisfactory condition.    Moody Bruins MD

## 2011-05-07 NOTE — H&P (Addendum)
[name] is an 78 y.o. female.    PCP: Kaleen Mask, MD, MD   Chief Complaint: Abdominal Pain  HPI: This is 78 year old, white female with a past medical history of, diabetes, hypertension, coronary artery disease, who lives with her. Some. Patient was in her usual state of health about 4 or 5 days ago, when she started having abdominal pain. The pain is located in the lower abdomen. She started having nausea and vomiting. Patient came in to the ED, he received symptomatic treatment and was subsequently sent back home. Patient appears to have some degree of dementia and so history is not adequate. Her son is at the, bedside. However, he is also not a great historian. During her first ED visit. There was some confusion and it was felt that the. Pain was mostly in her hip and no abnormalities were found. She was discharged home.  This morning, the son found the patient unresponsive. Her blood sugar was very low. EMS was called and she was transported to the hospital. There is no history of any fever. She's had decreased urination. She said she's had loose stools, but not profusely. Once, again, patient is confused, and history is limited. Son reports the patient has been getting forgetful a lot recently, but denies any history of dementia, per se.   Home Medications: Prior to Admission medications   Medication Sig Start Date End Date Taking? Authorizing Provider  amitriptyline-chlordiazePOXIDE (LIMBITROL) 12.5-5 MG per tablet Take 1 tablet by mouth at bedtime.   Yes Historical Provider, MD  aspirin EC 81 MG tablet Take 81 mg by mouth daily.   Yes Historical Provider, MD  Calcium Carbonate (CALCIUM 500 PO) Take 1 tablet by mouth daily.   Yes Historical Provider, MD  clopidogrel (PLAVIX) 75 MG tablet Take 75 mg by mouth daily.   Yes Historical Provider, MD  estradiol (ESTRING) 2 MG vaginal ring Place 2 mg vaginally every 3 (three) months. follow package directions   Yes Historical  Provider, MD  glimepiride (AMARYL) 4 MG tablet Take 2 mg by mouth daily before breakfast.     Yes Historical Provider, MD  HYDROcodone-acetaminophen (VICODIN) 5-500 MG per tablet Take 1 tablet by mouth every 6 (six) hours as needed. For pain.   Yes Historical Provider, MD  Lactobacillus (ACIDOPHILUS PO) Take 460 mg by mouth daily.   Yes Historical Provider, MD  latanoprost (XALATAN) 0.005 % ophthalmic solution Place 1 drop into the left eye daily.   Yes Historical Provider, MD  lovastatin (MEVACOR) 20 MG tablet Take 20 mg by mouth every evening.   Yes Historical Provider, MD  metFORMIN (GLUCOPHAGE) 1000 MG tablet Take 1,000 mg by mouth 2 (two) times daily.    Yes Historical Provider, MD  metoprolol (LOPRESSOR) 50 MG tablet Take 25 mg by mouth 2 (two) times daily.    Yes Historical Provider, MD  Multiple Vitamin (MULITIVITAMIN WITH MINERALS) TABS Take 1 tablet by mouth daily.   Yes Historical Provider, MD  Multiple Vitamins-Minerals (ICAPS PO) Take 1 capsule by mouth daily.   Yes Historical Provider, MD  niacin 500 MG tablet Take 500 mg by mouth 2 (two) times daily.   Yes Historical Provider, MD  Polyethyl Glycol-Propyl Glycol (SYSTANE OP) Apply 1-2 drops to eye as needed. For eye irritation   Yes Historical Provider, MD  predniSONE (DELTASONE) 5 MG tablet Take 5 mg by mouth daily.    Yes Historical Provider, MD  raloxifene (EVISTA) 60 MG tablet Take 60 mg by  mouth daily.     Yes Historical Provider, MD  ranitidine (ZANTAC) 300 MG tablet Take 150 mg by mouth 2 (two) times daily.   Yes Historical Provider, MD  trimethoprim (TRIMPEX) 100 MG tablet Take 100 mg by mouth every evening.    Yes Historical Provider, MD  Vitamin D, Ergocalciferol, (DRISDOL) 50000 UNITS CAPS Take 50,000 Units by mouth every 14 (fourteen) days.   Yes Historical Provider, MD  nitroGLYCERIN (NITROSTAT) 0.4 MG SL tablet Place 0.4 mg under the tongue every 5 (five) minutes as needed. For chest pain. 10/02/10 10/02/11  Peter M [place],  MD    Allergies:  Allergies  Allergen Reactions  . Codeine Other (See Comments)    Blacks out    Past Medical History: Past Medical History  Diagnosis Date  . Pacemaker   . CAD (coronary artery disease)   . CHB (complete heart block)   . HTN (hypertension), benign   . DM (diabetes mellitus)   . Hypertrophic cardiomyopathy   . Hyperlipidemia   . OP (osteoporosis)   . Hx: UTI (urinary tract infection)   . Fatigue   . Rheumatoid arthritis   . Chronic steroid use   . Confusion     Past Surgical History  Procedure Date  . Insert / replace / remove pacemaker   . Cardiac catheterization 04/17/2005    EF 65%  . Septal myectomy   . Nephrectomy     RIGHT  . Arm fracture surgery     RIGHT ARM  . Coronary artery bypass graft 04/2005    LIMA GRAFT TO THE DISTAL LAD, SAPHENOUS VEIN GRAFT TO THE DIAGONAL, SAPHENOUS VEIN GRAFT TO THE INTERMEDIATE BRANCH, AND SAPHENOUS VEIN GRAFT TO THE DISTAL RIGHT CORONARY  . Myomectomy   . Cataract extraction, bilateral     Social History:  reports that she quit smoking about 21 years ago. Her smoking use included Cigarettes. She smoked 1 pack per day. She has never used smokeless tobacco. She reports that she does not drink alcohol or use illicit drugs.  Family History:  Family History  Problem Relation Age of Onset  . Lung cancer Mother   . Cancer Father   . Heart attack Brother     Review of Systems -  unobtainable from patient due to mental status  Physical Examination Blood pressure 184/82, pulse 86, temperature 98.5 F (36.9 C), temperature source Rectal, resp. rate 22, SpO2 94.00%.  General appearance: alert, cooperative, appears stated age, distracted and no distress Head: Normocephalic, without obvious abnormality, atraumatic Eyes: conjunctivae/corneas clear. PERRL, EOM's intact.  Throat: lips, mucosa, and tongue normal; teeth and gums normal and dry mm Neck: no adenopathy, no carotid bruit, no JVD, supple, symmetrical, trachea  midline and thyroid not enlarged, symmetric, no tenderness/mass/nodules Resp: clear to auscultation bilaterally Cardio: regular rate and rhythm, S1, S2 normal, no murmur, click, rub or gallop GI: soft, mildly tender in lower abdomen; bowel sounds normal; no masses,  no organomegaly Extremities: extremities normal, atraumatic, no cyanosis or edema Pulses: 2+ and symmetric Skin: Skin color, texture, turgor normal. No rashes or lesions Lymph nodes: Cervical, supraclavicular, and axillary nodes normal. Neurologic: Grossly normal. Confused at times.  Laboratory Data: Results for orders placed during the hospital encounter of 05/07/11 (from the past 48 hour(s))  GLUCOSE, CAPILLARY     Status: Abnormal   Collection Time   05/07/11 10:39 AM      Component Value Range Comment   Glucose-Capillary 68 (*) 70 - 99 (mg/dL)  Comment 1 Orig Pt Id entered as [id number]     COMPREHENSIVE METABOLIC PANEL     Status: Abnormal   Collection Time   05/07/11 10:55 AM      Component Value Range Comment   Sodium 138  135 - 145 (mEq/L)    Potassium 6.3 (*) 3.5 - 5.1 (mEq/L)    Chloride 95 (*) 96 - 112 (mEq/L)    CO2 13 (*) 19 - 32 (mEq/L)    Glucose, Bld 59 (*) 70 - 99 (mg/dL)    BUN 83 (*) 6 - 23 (mg/dL)    Creatinine, Ser 6.29 (*) 0.50 - 1.10 (mg/dL)    Calcium 9.2  8.4 - 10.5 (mg/dL)    Total Protein 6.7  6.0 - 8.3 (g/dL)    Albumin 3.2 (*) 3.5 - 5.2 (g/dL)    AST 25  0 - 37 (U/L)    ALT 14  0 - 35 (U/L)    Alkaline Phosphatase 59  39 - 117 (U/L)    Total Bilirubin 0.2 (*) 0.3 - 1.2 (mg/dL)    GFR calc non Af Amer 4 (*) >90 (mL/min)    GFR calc Af Amer 5 (*) >90 (mL/min)   CBC     Status: Abnormal   Collection Time   05/07/11 10:55 AM      Component Value Range Comment   WBC 15.0 (*) 4.0 - 10.5 (K/uL)    RBC 4.49  3.87 - 5.11 (MIL/uL)    Hemoglobin 13.8  12.0 - 15.0 (g/dL)    HCT 52.8  41.3 - 24.4 (%)    MCV 92.4  78.0 - 100.0 (fL)    MCH 30.7  26.0 - 34.0 (pg)    MCHC 33.3  30.0 - 36.0 (g/dL)     RDW 01.0  27.2 - 53.6 (%)    Platelets 293  150 - 400 (K/uL)   DIFFERENTIAL     Status: Abnormal   Collection Time   05/07/11 10:55 AM      Component Value Range Comment   Neutrophils Relative 84 (*) 43 - 77 (%)    Neutro Abs 12.6 (*) 1.7 - 7.7 (K/uL)    Lymphocytes Relative 4 (*) 12 - 46 (%)    Lymphs Abs 0.6 (*) 0.7 - 4.0 (K/uL)    Monocytes Relative 12  3 - 12 (%)    Monocytes Absolute 1.8 (*) 0.1 - 1.0 (K/uL)    Eosinophils Relative 0  0 - 5 (%)    Eosinophils Absolute 0.0  0.0 - 0.7 (K/uL)    Basophils Relative 0  0 - 1 (%)    Basophils Absolute 0.0  0.0 - 0.1 (K/uL)   LIPASE, BLOOD     Status: Normal   Collection Time   05/07/11 10:55 AM      Component Value Range Comment   Lipase 51  11 - 59 (U/L)   PROCALCITONIN     Status: Normal   Collection Time   05/07/11 10:55 AM      Component Value Range Comment   Procalcitonin 0.24     GLUCOSE, CAPILLARY     Status: Abnormal   Collection Time   05/07/11 11:48 AM      Component Value Range Comment   Glucose-Capillary 39 (*) 70 - 99 (mg/dL)    Comment 1 Notify RN     URINE RAPID DRUG SCREEN (HOSP PERFORMED)     Status: Abnormal   Collection Time   05/07/11 12:04 PM  Component Value Range Comment   Opiates POSITIVE (*) NONE DETECTED     Cocaine NONE DETECTED  NONE DETECTED     Benzodiazepines POSITIVE (*) NONE DETECTED     Amphetamines NONE DETECTED  NONE DETECTED     Tetrahydrocannabinol NONE DETECTED  NONE DETECTED     Barbiturates NONE DETECTED  NONE DETECTED    URINALYSIS, ROUTINE W REFLEX MICROSCOPIC     Status: Abnormal   Collection Time   05/07/11 12:04 PM      Component Value Range Comment   Color, Urine BROWN (*) YELLOW  BIOCHEMICALS MAY BE AFFECTED BY COLOR   APPearance CLOUDY (*) CLEAR     Specific Gravity, Urine 1.025  1.005 - 1.030     pH 6.5  5.0 - 8.0     Glucose, UA NEGATIVE  NEGATIVE (mg/dL)    Hgb urine dipstick LARGE (*) NEGATIVE     Bilirubin Urine MODERATE (*) NEGATIVE     Ketones, ur 15 (*)  NEGATIVE (mg/dL)    Protein, ur >161 (*) NEGATIVE (mg/dL)    Urobilinogen, UA 1.0  0.0 - 1.0 (mg/dL)    Nitrite POSITIVE (*) NEGATIVE     Leukocytes, UA TRACE (*) NEGATIVE    URINE MICROSCOPIC-ADD ON     Status: Abnormal   Collection Time   05/07/11 12:04 PM      Component Value Range Comment   Squamous Epithelial / LPF FEW (*) RARE     WBC, UA 0-2  <3 (WBC/hpf)    RBC / HPF TOO NUMEROUS TO COUNT  <3 (RBC/hpf)    Bacteria, UA RARE  RARE     Casts GRANULAR CAST (*) NEGATIVE     Urine-Other MUCOUS PRESENT     GLUCOSE, CAPILLARY     Status: Abnormal   Collection Time   05/07/11 12:30 PM      Component Value Range Comment   Glucose-Capillary 117 (*) 70 - 99 (mg/dL)   LACTIC ACID, PLASMA     Status: Abnormal   Collection Time   05/07/11  1:20 PM      Component Value Range Comment   Lactic Acid, Venous 8.8 (*) 0.5 - 2.2 (mmol/L)   POCT I-STAT 3, BLOOD GAS (G3+)     Status: Abnormal   Collection Time   05/07/11  1:52 PM      Component Value Range Comment   pH, Arterial 7.275 (*) 7.350 - 7.400     pCO2 arterial 23.7 (*) 35.0 - 45.0 (mmHg)    pO2, Arterial 86.0  80.0 - 100.0 (mmHg)    Bicarbonate 11.0 (*) 20.0 - 24.0 (mEq/L)    TCO2 12  0 - 100 (mmol/L)    O2 Saturation 95.0      Acid-base deficit 14.0 (*) 0.0 - 2.0 (mmol/L)    Patient temperature 98.6 F      Collection site RADIAL, ALLEN'S TEST ACCEPTABLE      Drawn by Operator      Sample type ARTERIAL     COMPREHENSIVE METABOLIC PANEL     Status: Abnormal   Collection Time   05/07/11  2:52 PM      Component Value Range Comment   Sodium 138  135 - 145 (mEq/L)    Potassium 7.0 (*) 3.5 - 5.1 (mEq/L)    Chloride 99  96 - 112 (mEq/L)    CO2 14 (*) 19 - 32 (mEq/L)    Glucose, Bld 28 (*) 70 - 99 (mg/dL)    BUN 87 (*)  6 - 23 (mg/dL)    Creatinine, Ser 8.29 (*) 0.50 - 1.10 (mg/dL)    Calcium 8.9  8.4 - 10.5 (mg/dL)    Total Protein 6.3  6.0 - 8.3 (g/dL)    Albumin 3.1 (*) 3.5 - 5.2 (g/dL)    AST 27  0 - 37 (U/L)    ALT 13  0 - 35  (U/L)    Alkaline Phosphatase 56  39 - 117 (U/L)    Total Bilirubin 0.2 (*) 0.3 - 1.2 (mg/dL)    GFR calc non Af Amer 4 (*) >90 (mL/min)    GFR calc Af Amer 5 (*) >90 (mL/min)   ACETAMINOPHEN LEVEL     Status: Normal   Collection Time   05/07/11  2:52 PM      Component Value Range Comment   Acetaminophen (Tylenol), Serum <15.0  10 - 30 (ug/mL)   SALICYLATE LEVEL     Status: Abnormal   Collection Time   05/07/11  2:52 PM      Component Value Range Comment   Salicylate Lvl <2.0 (*) 2.8 - 20.0 (mg/dL)     Radiology Reports: Ct Abdomen Pelvis Wo Contrast  05/07/2011  *RADIOLOGY REPORT*  Clinical Data: Acute renal failure.  Abdominal pain  CT ABDOMEN AND PELVIS WITHOUT CONTRAST  Technique:  Multidetector CT imaging of the abdomen and pelvis was performed following the standard protocol without intravenous contrast.  Comparison: CT 10/26/2008  Findings: Obstruction of the left kidney with hydronephrosis and perinephric stranding.  7 mm stone in the proximal left ureter causing high grade obstruction of the left kidney.  Right kidney has been removed.  Lung bases are clear.  15 mm hypodensity in the left lobe of the liver is unchanged from the  prior study and is compatible with a cyst.  No new liver lesions are seen.  Gallstones are present layering in the gallbladder.  Bile ducts are not dilated.  Pancreas and spleen are normal.  Negative for bowel obstruction.  Sigmoid diverticulosis is present. Pessary is present in the vagina.  Chronic fracture of L1. Degenerative changes in the lumbar spine. Atherosclerotic aorta without aneurysm.  IMPRESSION: High-grade obstruction of the left kidney due to a 7 mm stone in the proximal left ureter.  Right kidney is surgically absent.  Original Report Authenticated By: Camelia Phenes, M.D.   Dg Chest 2 View  05/07/2011  *RADIOLOGY REPORT*  Clinical Data: Confusion, shortness of breath, history hypertension, diabetes, coronary artery disease, hypertrophic  cardiomyopathy  CHEST - 2 VIEW  Comparison: 12/29/2008  Findings: Left subclavian sequential transvenous pacemaker leads project at right atrium and right ventricle. Normal heart size post CABG. Minimally prominent left superior mediastinal soft tissues stable, question related to tortuous innominate artery. Atherosclerotic calcification aorta. Pulmonary vascularity normal. Chronic elevation right diaphragm. Minimal bibasilar atelectasis. Aeration at left base improved since previous exam. Upper lungs clear. Bones diffusely demineralized.  IMPRESSION: Post CABG and pacemaker. Minimal bibasilar atelectasis and chronic elevation right diaphragm.  Original Report Authenticated By: Lollie Marrow, M.D.   Dg Pelvis 1-2 Views  05/07/2011  *RADIOLOGY REPORT*  Clinical Data: Hip discomfort post recent fall  PELVIS - 1-2 VIEW  Comparison: None  Findings: Osseous demineralization. Hip and SI joint spaces preserved. No acute fracture, dislocation, or bone destruction. Minimal increased attenuation of the femoral heads bilaterally likely degenerative in origin, corresponding to minimal subchondral sclerosis identified at the posterior aspects of the femoral heads on a prior CT. Scattered pelvic phleboliths. Degenerative  disc and facet disease changes lower lumbar spine.  IMPRESSION: Osseous demineralization with minimal degenerative changes of the hip joints bilaterally. No definite acute osseous abnormalities. If patient has persistent symptoms or an inability to bear weight, consider MR imaging of the hip without contrast to exclude occult fracture.  Original Report Authenticated By: Lollie Marrow, M.D.   Ct Head Wo Contrast  05/07/2011  *RADIOLOGY REPORT*  Clinical Data: Hypoglycemia.  Unresponsive.  Improved with administration of glucose.  CT HEAD WITHOUT CONTRAST  Technique:  Contiguous axial images were obtained from the base of the skull through the vertex without contrast.  Comparison: 12/31/2008.  Findings: No  intracranial hemorrhage.  Interval development of hypodensity within the right paracentral pontine region.  This may represent a small acute infarct.  Result of artifact not excluded.  Small vessel disease type changes.  Global atrophy without hydrocephalus.  Vascular calcifications.  No intracranial mass lesion detected on this unenhanced exam.  Opacification in the left sphenoid sinus.  IMPRESSION: Interval development of hypodensity within the right paracentral pontine region.  This may represent a small acute infarct.  Result of artifact not excluded.  Opacification left sphenoid sinus.  Original Report Authenticated By: Fuller Canada, M.D.    Electrocardiogram: Paced rhythm  Assessment/Plan  Principal Problem:  *Acute renal failure (ARF) Active Problems:  Pacemaker  CAD (coronary artery disease)  HTN (hypertension), benign  Ureterolithiasis  Hydronephrosis  Hyperkalemia  Hypoglycemia  Metabolic acidemia  DM type 2 (diabetes mellitus, type 2)   #1 acute renal failure secondary to obstruction from ureteral stone: She'll be given IV fluids. Urology has been consulted especially because patient only has one kidney. Urologist will discuss this issue with the patient and her son. Patient will need to have some form of intervention to relieve her obstruction. Once her obstruction is relieved she may go into postobstructive diuresis. We may have to involve nephrology depending on how her renal function responds. PCCM has already discussed aggressive care with the patient and her son and they do not desire hemodialysis or any aggressive intervention at this time.  #2 metabolic acidosis with hyperkalemia: She'll be given bicarbonate and kayexalate. This is secondary to her renal failure. Will monitor her electrolytes closely, every 4 hours.  #3 abnormal UA: Is secondary to renal failure. Could have a UTI. We'll put her on ceftriaxone for now and follow up on urine cultures.  #4 hypoglycemia  in the setting of known diabetes: Hypoglycemia is secondary to her acute renal failure and the fact, that she was taking her hypoglycemic agents at home. We will give D5 infusion with bicarbonate.  #5 history of coronary artery disease, status post CABG, is stable. Continue with Plavix. She has a pacemaker.  #6 abnormal CT scan of the head. It's unlikely she's had a stroke based on clinical examination. She cannot have a MRI due to pacemaker. A repeat CT can be considered.  #7 She is on chronic steroids for RA. Will give stress steroids for now. This can be tapered quickly depending on clinical setting.  CODE STATUS has been discussed by PCCM and she will be a DO NOT RESUSCITATE/DNI.  Patient will be admitted to step down unit due to her significant electrolyte imbalances.  Further management decisions will depend on results of further testing and patient's response to treatment and input from consultants.  [facility]  [facility] Pager [phone]  05/07/2011, 4:23 PM

## 2011-05-07 NOTE — ED Notes (Signed)
Shon Hale, RN was wasting Fentanyl for Jackie Plum, RN.  Inadvertently mixed numbers and recorded in Pyxis that pt received and wasted .  In reality, pt received and we wasted in sharps container in ED Blue.  Called pharmacy to see if anything needed to be corrected.  Homero Fellers in pharmacy stated that the counts would be correct in Pyxis.  Homero Fellers informed that I should leave a detailed note so pt is not charged for Fentanyl.

## 2011-05-07 NOTE — Anesthesia Procedure Notes (Signed)
Procedure Name: MAC Date/Time: 05/07/2011 7:10 PM Performed by: Fuller Canada Pre-anesthesia Checklist: Patient identified, Timeout performed, Emergency Drugs available and Suction available Oxygen Delivery Method: Simple face mask

## 2011-05-07 NOTE — ED Notes (Signed)
Orders placed for meds and when speaking with drmcmanus she stated that even though pt's cbg was 68 prior to transport to x-ray and ct to hold all meds and just recheck her cbg when returned from radiology. md was made aware that cbg was dropping. md still wanted to continue with current plan of care. Pt transported to radiology for ct head and chest x-ray

## 2011-05-07 NOTE — ED Notes (Signed)
Dr. Clarene Duke notified that pt CBG was 39. Amp of D50 ordered.

## 2011-05-07 NOTE — Consult Note (Addendum)
Name: [name] MRN: [id number] DOB: 11-08-1924    LOS: 0  [facility]  History of Present Illness:  78 yo female with hx HTN, DM, RA on chronic steroids.  Presented to ER 3/27 after being found unresponsive by family.  Hypoglycemic on EMS arrival with CBG of 25.  Received d50 with improved mentation and cbg and was brought to ER.  Further w/u revealed acute renal failure with SCr 8.02 and associated hyperkalemia and PCCM called to admit.   Lines / Drains: none  Cultures: Urine 3/27>>>  Antibiotics:   Tests / Events: CT head 3/27>>>Interval development of hypodensity within the right paracentral pontine region. This may represent a small acute infarct. Result of artifact not excluded.  Opacification left sphenoid sinus.    Past Medical History  Diagnosis Date  . Pacemaker   . CAD (coronary artery disease)   . CHB (complete heart block)   . HTN (hypertension), benign   . DM (diabetes mellitus)   . Hypertrophic cardiomyopathy   . Hyperlipidemia   . OP (osteoporosis)   . Hx: UTI (urinary tract infection)   . Fatigue   . Rheumatoid arthritis   . Chronic steroid use   . Confusion    Past Surgical History  Procedure Date  . Insert / replace / remove pacemaker   . Cardiac catheterization 04/17/2005    EF 65%  . Septal myectomy   . Nephrectomy     RIGHT  . Arm fracture surgery     RIGHT ARM  . Coronary artery bypass graft 04/2005    LIMA GRAFT TO THE DISTAL LAD, SAPHENOUS VEIN GRAFT TO THE DIAGONAL, SAPHENOUS VEIN GRAFT TO THE INTERMEDIATE BRANCH, AND SAPHENOUS VEIN GRAFT TO THE DISTAL RIGHT CORONARY  . Myomectomy   . Cataract extraction, bilateral    Prior to Admission medications   Medication Sig Start Date End Date Taking? Authorizing Provider  amitriptyline-chlordiazePOXIDE (LIMBITROL) 12.5-5 MG per tablet Take 1 tablet by mouth at bedtime.   Yes Historical Provider, MD  aspirin EC 81 MG tablet Take 81 mg by mouth daily.   Yes Historical Provider, MD  Calcium  Carbonate (CALCIUM 500 PO) Take 1 tablet by mouth daily.   Yes Historical Provider, MD  clopidogrel (PLAVIX) 75 MG tablet Take 75 mg by mouth daily.   Yes Historical Provider, MD  estradiol (ESTRING) 2 MG vaginal ring Place 2 mg vaginally every 3 (three) months. follow package directions   Yes Historical Provider, MD  glimepiride (AMARYL) 4 MG tablet Take 2 mg by mouth daily before breakfast.     Yes Historical Provider, MD  HYDROcodone-acetaminophen (VICODIN) 5-500 MG per tablet Take 1 tablet by mouth every 6 (six) hours as needed. For pain.   Yes Historical Provider, MD  Lactobacillus (ACIDOPHILUS PO) Take 460 mg by mouth daily.   Yes Historical Provider, MD  latanoprost (XALATAN) 0.005 % ophthalmic solution Place 1 drop into the left eye daily.   Yes Historical Provider, MD  lovastatin (MEVACOR) 20 MG tablet Take 20 mg by mouth every evening.   Yes Historical Provider, MD  metFORMIN (GLUCOPHAGE) 1000 MG tablet Take 1,000 mg by mouth 2 (two) times daily.    Yes Historical Provider, MD  metoprolol (LOPRESSOR) 50 MG tablet Take 25 mg by mouth 2 (two) times daily.    Yes Historical Provider, MD  Multiple Vitamin (MULITIVITAMIN WITH MINERALS) TABS Take 1 tablet by mouth daily.   Yes Historical Provider, MD  Multiple Vitamins-Minerals (ICAPS PO) Take 1 capsule  by mouth daily.   Yes Historical Provider, MD  niacin 500 MG tablet Take 500 mg by mouth 2 (two) times daily.   Yes Historical Provider, MD  Polyethyl Glycol-Propyl Glycol (SYSTANE OP) Apply 1-2 drops to eye as needed. For eye irritation   Yes Historical Provider, MD  predniSONE (DELTASONE) 5 MG tablet Take 5 mg by mouth daily.    Yes Historical Provider, MD  raloxifene (EVISTA) 60 MG tablet Take 60 mg by mouth daily.     Yes Historical Provider, MD  ranitidine (ZANTAC) 300 MG tablet Take 150 mg by mouth 2 (two) times daily.   Yes Historical Provider, MD  trimethoprim (TRIMPEX) 100 MG tablet Take 100 mg by mouth every evening.    Yes Historical  Provider, MD  Vitamin D, Ergocalciferol, (DRISDOL) 50000 UNITS CAPS Take 50,000 Units by mouth every 14 (fourteen) days.   Yes Historical Provider, MD  nitroGLYCERIN (NITROSTAT) 0.4 MG SL tablet Place 0.4 mg under the tongue every 5 (five) minutes as needed. For chest pain. 10/02/10 10/02/11  Peter M [place], MD   Allergies Allergies  Allergen Reactions  . Codeine Other (See Comments)    Blacks out    Family History Family History  Problem Relation Age of Onset  . Lung cancer Mother   . Cancer Father   . Heart attack Brother     Social History  reports that she quit smoking about 21 years ago. Her smoking use included Cigarettes. She smoked 1 pack per day. She has never used smokeless tobacco. She reports that she does not drink alcohol or use illicit drugs.  Review Of Systems  Per family took her oral DM meds last night but did not eat.  Has been having increased hip pain and lower abd pain which is why she did not eat.  Has also had difficulty with swallowing.  Per family no c/o chest pain, cough, fever, SOB, hemoptysis.    Vital Signs: Temp:  [98.1 F (36.7 C)-98.5 F (36.9 C)] 98.5 F (36.9 C) (03/27 1204) Pulse Rate:  [80-87] 86  (03/27 1330) Resp:  [21-23] 22  (03/27 1330) BP: (159-184)/(82-109) 184/82 mmHg (03/27 1330) SpO2:  [94 %-99 %] 94 % (03/27 1330)    Physical Examination:  General: Neuro: CV:  PULM: GI: Extremities:    Labs and Imaging:   CBC    Component Value Date/Time   WBC 15.0* 05/07/2011 1055   RBC 4.49 05/07/2011 1055   HGB 13.8 05/07/2011 1055   HCT 41.5 05/07/2011 1055   PLT 293 05/07/2011 1055   MCV 92.4 05/07/2011 1055   MCH 30.7 05/07/2011 1055   MCHC 33.3 05/07/2011 1055   RDW 14.1 05/07/2011 1055   LYMPHSABS 0.6* 05/07/2011 1055   MONOABS 1.8* 05/07/2011 1055   EOSABS 0.0 05/07/2011 1055   BASOSABS 0.0 05/07/2011 1055    BMET    Component Value Date/Time   NA 138 05/07/2011 1055   K 6.3* 05/07/2011 1055   CL 95* 05/07/2011 1055   CO2  13* 05/07/2011 1055   GLUCOSE 59* 05/07/2011 1055   BUN 83* 05/07/2011 1055   CREATININE 8.02* 05/07/2011 1055   CALCIUM 9.2 05/07/2011 1055   GFRNONAA 4* 05/07/2011 1055   GFRAA 5* 05/07/2011 1055    No results found for this basename: INR in the last 168 hours  ABG    Component Value Date/Time   PHART 7.275* 05/07/2011 1352   PCO2ART 23.7* 05/07/2011 1352   PO2ART 86.0 05/07/2011 1352  HCO3 11.0* 05/07/2011 1352   TCO2 12 05/07/2011 1352   ACIDBASEDEF 14.0* 05/07/2011 1352   O2SAT 95.0 05/07/2011 1352    Dg Chest 2 View  05/07/2011  *RADIOLOGY REPORT*  Clinical Data: Confusion, shortness of breath, history hypertension, diabetes, coronary artery disease, hypertrophic cardiomyopathy  CHEST - 2 VIEW  Comparison: 12/29/2008  Findings: Left subclavian sequential transvenous pacemaker leads project at right atrium and right ventricle. Normal heart size post CABG. Minimally prominent left superior mediastinal soft tissues stable, question related to tortuous innominate artery. Atherosclerotic calcification aorta. Pulmonary vascularity normal. Chronic elevation right diaphragm. Minimal bibasilar atelectasis. Aeration at left base improved since previous exam. Upper lungs clear. Bones diffusely demineralized.  IMPRESSION: Post CABG and pacemaker. Minimal bibasilar atelectasis and chronic elevation right diaphragm.  Original Report Authenticated By: Lollie Marrow, M.D.   Dg Pelvis 1-2 Views  05/07/2011  *RADIOLOGY REPORT*  Clinical Data: Hip discomfort post recent fall  PELVIS - 1-2 VIEW  Comparison: None  Findings: Osseous demineralization. Hip and SI joint spaces preserved. No acute fracture, dislocation, or bone destruction. Minimal increased attenuation of the femoral heads bilaterally likely degenerative in origin, corresponding to minimal subchondral sclerosis identified at the posterior aspects of the femoral heads on a prior CT. Scattered pelvic phleboliths. Degenerative disc and facet disease changes  lower lumbar spine.  IMPRESSION: Osseous demineralization with minimal degenerative changes of the hip joints bilaterally. No definite acute osseous abnormalities. If patient has persistent symptoms or an inability to bear weight, consider MR imaging of the hip without contrast to exclude occult fracture.  Original Report Authenticated By: Lollie Marrow, M.D.   Ct Head Wo Contrast  05/07/2011  *RADIOLOGY REPORT*  Clinical Data: Hypoglycemia.  Unresponsive.  Improved with administration of glucose.  CT HEAD WITHOUT CONTRAST  Technique:  Contiguous axial images were obtained from the base of the skull through the vertex without contrast.  Comparison: 12/31/2008.  Findings: No intracranial hemorrhage.  Interval development of hypodensity within the right paracentral pontine region.  This may represent a small acute infarct.  Result of artifact not excluded.  Small vessel disease type changes.  Global atrophy without hydrocephalus.  Vascular calcifications.  No intracranial mass lesion detected on this unenhanced exam.  Opacification in the left sphenoid sinus.  IMPRESSION: Interval development of hypodensity within the right paracentral pontine region.  This may represent a small acute infarct.  Result of artifact not excluded.  Opacification left sphenoid sinus.  Original Report Authenticated By: Fuller Canada, M.D.      Assessment and Plan:  AMS - In setting profound hypoglycemia. CT head negative.  PLAN -  Supportive care  See below   Acute renal failure -- see below for goals of care discussion PLAN -  F/u chem No HD Aggressive volume as resp status tolerates   metabolic acidosis - lactic acidosis likely r/t metformin overdose.  PLAN -  hco3 Aggressive volume resuscitation  See above  Hyperkalemia --  PLAN -  Tele No hd Frequent f/u chem   UTI -  PLAN -  Urine culture pending abx per admitting team   Hypoglycemia - in setting metformin overdose  PLAN -  Dextrose IV fluids  as needed  Close monitoring cbg  Goals of care - Discussed at length with son at bedside.  Pt has living will.  Pt has been declining over last several days with multiple underlying chronic medical conditions. He wishes to focus on comfort in this situation.  Will proceed with DNR/DNI.  No dialysis, no cvl placement, no pressors, no antiarrythmic drugs.  Would recommend full medical care to include abx, IV fluids and f/u labs.  Will defer admission to hospitalist. PCCM signing off please call if needed.  Danford Bad, NP 05/07/2011  2:37 PM Pager: [phone]) [phone]  *Care during the described time interval was provided by me and/or other providers on the critical care team. I have reviewed this patient's available data, including medical history, events of note, physical examination and test results as part of my evaluation.  This is a classic Metformin OD, patient in acute renal failure and acidosis, would go through recommendations above + bicarb drip and ? Renal consultation and renal U/S.  We spoke with the son extensively, after discussion, decided to make patient a full NCB with no dialysis or further procedure.  Also, address hyperkalemia with calcium and kayexalate + lasix, would not give additional insulin right now without close monitoring of blood sugar due to persistent hypoglycemia.  With regards to hypoglycemia the D5W with three amps of bicarb should address.  [facility] hospitalist to admit.  PCCM signing off, please call back if needed.  Patient seen and examined, agree with above note.  I dictated the care and orders written for this patient under my direction.  Koren Bound, M.D. [phone]

## 2011-05-07 NOTE — ED Notes (Signed)
Pt took her metformin last night at dinner time but did not eat with the medication. Pt lives with her son and when he checked on her she was unresponsive. Upon ems arrival her blood sugar was 25. Pt received 1 amp of d50 with ems and recheck cbg was 95. Pt is alert and oriented. She states that she has been also having intermittent abdominal pain and left hip pain which she has been evaluated for previously. Pt is cool to the touch. Iv started pta by ems. Protocols initiated upon arrival to dept.

## 2011-05-07 NOTE — Anesthesia Preprocedure Evaluation (Signed)
Anesthesia Evaluation  Patient identified by MRN, date of birth, ID band Patient awake    Reviewed: Allergy & Precautions, H&P , NPO status , Patient's Chart, lab work & pertinent test results, reviewed documented beta blocker date and time   Airway Mallampati: II TM Distance: >3 FB Neck ROM: full    Dental   Pulmonary neg pulmonary ROS,          Cardiovascular hypertension, On Home Beta Blockers and On Medications + CAD + dysrhythmias + pacemaker     Neuro/Psych negative neurological ROS  negative psych ROS   GI/Hepatic negative GI ROS, Neg liver ROS,   Endo/Other  Diabetes mellitus-, Poorly Controlled  Renal/GU negative Renal ROS  negative genitourinary   Musculoskeletal   Abdominal   Peds  Hematology negative hematology ROS (+)   Anesthesia Other Findings See surgeon's H&P   Reproductive/Obstetrics negative OB ROS                           Anesthesia Physical Anesthesia Plan  ASA: IV and Emergent  Anesthesia Plan: MAC   Post-op Pain Management:    Induction: Intravenous  Airway Management Planned: Simple Face Mask  Additional Equipment:   Intra-op Plan:   Post-operative Plan:   Informed Consent: I have reviewed the patients History and Physical, chart, labs and discussed the procedure including the risks, benefits and alternatives for the proposed anesthesia with the patient or authorized representative who has indicated his/her understanding and acceptance.     Plan Discussed with: CRNA and Surgeon  Anesthesia Plan Comments:         Anesthesia Quick Evaluation

## 2011-05-07 NOTE — ED Provider Notes (Signed)
History     CSN: [id number]  Arrival date & time 05/07/11  1020   First MD Initiated Contact with Patient 05/07/11 1029      Chief Complaint  Patient presents with  . Hypoglycemia  . Abdominal Pain  . Hip Pain    The history is provided by a caregiver. History Limited By: AMS.  Pt was seen at 1040.  Per EMS and family report, pt found unresponsive this morning by her son.  EMS states pt's CBG was "25," improved to 95 after IV D50.  Pt's son states pt has not taken PO well due to generalized abd pain, nausea, and left hip pain for the past 3 days.  Pt was eval in the ED 3 days ago for same, given pain meds without relief.  Pt has also been eval by her PMD last week for hematuria and dysuria, dx UTI, tx keflex with relief of symptoms.  No reported vomiting/diarrhea, no fevers, no CP/SOB, no cough.    Past Medical History  Diagnosis Date  . Pacemaker   . CAD (coronary artery disease)   . CHB (complete heart block)   . HTN (hypertension), benign   . DM (diabetes mellitus)   . Hypertrophic cardiomyopathy   . Hyperlipidemia   . OP (osteoporosis)   . Hx: UTI (urinary tract infection)   . Fatigue   . Rheumatoid arthritis   . Chronic steroid use   . Confusion   . Pancreatic pseudocyst   . Diverticulosis     Past Surgical History  Procedure Date  . Insert / replace / remove pacemaker   . Cardiac catheterization 04/17/2005    EF 65%  . Septal myectomy   . Nephrectomy     RIGHT  . Arm fracture surgery     RIGHT ARM  . Coronary artery bypass graft 04/2005    LIMA GRAFT TO THE DISTAL LAD, SAPHENOUS VEIN GRAFT TO THE DIAGONAL, SAPHENOUS VEIN GRAFT TO THE INTERMEDIATE BRANCH, AND SAPHENOUS VEIN GRAFT TO THE DISTAL RIGHT CORONARY  . Myomectomy   . Cataract extraction, bilateral     Family History  Problem Relation Age of Onset  . Lung cancer Mother   . Cancer Father   . Heart attack Brother     History  Substance Use Topics  . Smoking status: Former Smoker -- 1.0 packs/day      Types: Cigarettes    Quit date: 05/07/1990  . Smokeless tobacco: Never Used  . Alcohol Use: No    Review of Systems  Unable to perform ROS: Other    Allergies  Codeine  Home Medications   Current Outpatient Rx  Name Route Sig Dispense Refill  . CHLORDIAZEPOXIDE-AMITRIPTYLINE 5-12.5 MG PO TABS Oral Take 1 tablet by mouth at bedtime.    . ASPIRIN EC 81 MG PO TBEC Oral Take 81 mg by mouth daily.    Marland Kitchen CALCIUM 500 PO Oral Take 1 tablet by mouth daily.    Marland Kitchen CLOPIDOGREL BISULFATE 75 MG PO TABS Oral Take 75 mg by mouth daily.    Marland Kitchen ESTRADIOL 2 MG VA RING Vaginal Place 2 mg vaginally every 3 (three) months. follow package directions    . GLIMEPIRIDE 4 MG PO TABS Oral Take 2 mg by mouth daily before breakfast.      . HYDROCODONE-ACETAMINOPHEN 5-500 MG PO TABS Oral Take 1 tablet by mouth every 6 (six) hours as needed. For pain.    Marland Kitchen ACIDOPHILUS PO Oral Take 460 mg by mouth daily.    Marland Kitchen  LATANOPROST 0.005 % OP SOLN Left Eye Place 1 drop into the left eye daily.    Marland Kitchen LOVASTATIN 20 MG PO TABS Oral Take 20 mg by mouth every evening.    Marland Kitchen METFORMIN HCL 1000 MG PO TABS Oral Take 1,000 mg by mouth 2 (two) times daily.     Marland Kitchen METOPROLOL TARTRATE 50 MG PO TABS Oral Take 25 mg by mouth 2 (two) times daily.     . ADULT MULTIVITAMIN W/MINERALS CH Oral Take 1 tablet by mouth daily.    . ICAPS PO Oral Take 1 capsule by mouth daily.    Marland Kitchen NIACIN 500 MG PO TABS Oral Take 500 mg by mouth 2 (two) times daily.    Frazier Butt OP Ophthalmic Apply 1-2 drops to eye as needed. For eye irritation    . PREDNISONE 5 MG PO TABS Oral Take 5 mg by mouth daily.     Marland Kitchen RALOXIFENE HCL 60 MG PO TABS Oral Take 60 mg by mouth daily.      Marland Kitchen RANITIDINE HCL 300 MG PO TABS Oral Take 150 mg by mouth 2 (two) times daily.    Marland Kitchen TRIMETHOPRIM 100 MG PO TABS Oral Take 100 mg by mouth every evening.     Marland Kitchen VITAMIN D (ERGOCALCIFEROL) 50000 UNITS PO CAPS Oral Take 50,000 Units by mouth every 14 (fourteen) days.    Marland Kitchen NITROGLYCERIN 0.4 MG SL  SUBL Sublingual Place 0.4 mg under the tongue every 5 (five) minutes as needed. For chest pain.      BP 185/83  Pulse 94  Temp(Src) 98.5 F (36.9 C) (Rectal)  Resp 30  SpO2 95%  Physical Exam 1045: Physical examination:  Nursing notes reviewed; Vital signs and O2 SAT reviewed;  Constitutional: Well developed, Well nourished, In no acute distress; Head:  Normocephalic, atraumatic; Eyes: EOMI, PERRL, No scleral icterus; ENMT: Mouth and pharynx normal, Mucous membranes dry; Neck: Supple, Full range of motion, No lymphadenopathy; Cardiovascular: Regular rate and rhythm, No murmur or gallop; Respiratory: Breath sounds clear & equal bilaterally, No rales, rhonchi, wheezes, Normal respiratory effort/excursion; Chest: Nontender, Movement normal; Abdomen: Soft, +diffusely tender to palp, no rebound or guarding, Nondistended, Normal bowel sounds; Extremities: Pulses normal, No tenderness, No edema, No calf edema or asymmetry.; Neuro: Awake, alert, confused re: events, speech clear, no facial droop, moves all ext on stretcher to command and spontaneously without gross focal motor deficits.; Skin: Color normal, Warm, Dry, no rash, no petechiae.    ED Course  Procedures   1230:  Since arrival to ED, pt has experienced multiple drops in CBG, has been given several doses of IV D50 with improvement of CBG.  IVF changed to D5NS with better control of glucose.  Pt has been maintaining her baseline mental status per son at bedside, resps easy, VSS, afebrile.  Pt continues to c/o nausea, abd diffusely tender.  Given new onset ARF and pt with only 1 kidney, will check CT A/P without contrast.   1420:  Dx testing d/w pt and family.  Questions answered.  Verb understanding, agreeable to admit.  T/C to PCCM MD Ignacia Marvel, case discussed, including:  HPI, pertinent PM/SHx, VS/PE, dx testing, ED course and treatment:  Agreeable to eval pt while in ED for possible admit, aware CT A/P is pending reading at this time.  1445:   PCCM MD has eval pt and talked with pt's son/POA:  Son/POA requests to make pt DNR/DNI, no invasive procedures; Dr. Ignacia Marvel feels pt can be admitted to step  down under medicine service after this conversation with pt's son.  1520:  T/C to [facility] Dr. Rito Ehrlich, case discussed, including:  HPI, pertinent PM/SHx, VS/PE, dx testing, ED course and treatment:  Agreeable to admit, requests to obtain step down bed to team 6; aware I am waiting for a call back from Uro MD.  1555:  T/C to [facility] Dr. Laverle Patter, case discussed, including:  HPI, pertinent PM/SHx, VS/PE, dx testing, ED course and treatment:  Agreeable to consult in ED, requests to keep pt NPO, keep son at bedside, as pt will likely need to go to OR tonight to relieve obstruction.   [facility] Dr. Rito Ehrlich made aware of my conversation with U[facility] Dr. Laverle Patter; he is already has given orders to treat elevated potassium and agrees I change kayexalate from PO to PR.      MDM  MDM Reviewed: previous chart, nursing note and vitals Reviewed previous: ECG Interpretation: ECG, labs, x-ray and CT scan Total time providing critical care: 75-105 minutes. This excludes time spent performing separately reportable procedures and services. Consults: admitting MD, critical care and urology   CRITICAL CARE Performed by: Laray Anger Total critical care time: 90 Critical care time was exclusive of separately billable procedures and treating other patients. Critical care was necessary to treat or prevent imminent or life-threatening deterioration. Critical care was time spent personally by me on the following activities: development of treatment plan with patient and/or surrogate as well as nursing, discussions with consultants, evaluation of patient's response to treatment, examination of patient, obtaining history from patient or surrogate, ordering and performing treatments and interventions, ordering and review of laboratory studies, ordering and review of radiographic  studies, pulse oximetry and re-evaluation of patient's condition.      Date: 05/07/2011  Rate: 87  Rhythm: normal sinus rhythm  QRS Axis: left  Intervals: PR prolonged  ST/T Wave abnormalities: nonspecific ST/T changes  Conduction Disutrbances:first-degree A-V block  and nonspecific intraventricular conduction delay  Narrative Interpretation:   Old EKG Reviewed: unchanged; no significant changes from previous EKG dated 12/29/2008.    Results for orders placed during the hospital encounter of 05/07/11  URINE RAPID DRUG SCREEN (HOSP PERFORMED)      Component Value Range   Opiates POSITIVE (*) NONE DETECTED    Cocaine NONE DETECTED  NONE DETECTED    Benzodiazepines POSITIVE (*) NONE DETECTED    Amphetamines NONE DETECTED  NONE DETECTED    Tetrahydrocannabinol NONE DETECTED  NONE DETECTED    Barbiturates NONE DETECTED  NONE DETECTED   COMPREHENSIVE METABOLIC PANEL      Component Value Range   Sodium 138  135 - 145 (mEq/L)   Potassium 6.3 (*) 3.5 - 5.1 (mEq/L)   Chloride 95 (*) 96 - 112 (mEq/L)   CO2 13 (*) 19 - 32 (mEq/L)   Glucose, Bld 59 (*) 70 - 99 (mg/dL)   BUN 83 (*) 6 - 23 (mg/dL)   Creatinine, Ser 9.60 (*) 0.50 - 1.10 (mg/dL)   Calcium 9.2  8.4 - 45.4 (mg/dL)   Total Protein 6.7  6.0 - 8.3 (g/dL)   Albumin 3.2 (*) 3.5 - 5.2 (g/dL)   AST 25  0 - 37 (U/L)   ALT 14  0 - 35 (U/L)   Alkaline Phosphatase 59  39 - 117 (U/L)   Total Bilirubin 0.2 (*) 0.3 - 1.2 (mg/dL)   GFR calc non Af Amer 4 (*) >90 (mL/min)   GFR calc Af Amer 5 (*) >90 (mL/min)  CBC  Component Value Range   WBC 15.0 (*) 4.0 - 10.5 (K/uL)   RBC 4.49  3.87 - 5.11 (MIL/uL)   Hemoglobin 13.8  12.0 - 15.0 (g/dL)   HCT 16.1  09.6 - 04.5 (%)   MCV 92.4  78.0 - 100.0 (fL)   MCH 30.7  26.0 - 34.0 (pg)   MCHC 33.3  30.0 - 36.0 (g/dL)   RDW 40.9  81.1 - 91.4 (%)   Platelets 293  150 - 400 (K/uL)  DIFFERENTIAL      Component Value Range   Neutrophils Relative 84 (*) 43 - 77 (%)   Neutro Abs 12.6 (*) 1.7 -  7.7 (K/uL)   Lymphocytes Relative 4 (*) 12 - 46 (%)   Lymphs Abs 0.6 (*) 0.7 - 4.0 (K/uL)   Monocytes Relative 12  3 - 12 (%)   Monocytes Absolute 1.8 (*) 0.1 - 1.0 (K/uL)   Eosinophils Relative 0  0 - 5 (%)   Eosinophils Absolute 0.0  0.0 - 0.7 (K/uL)   Basophils Relative 0  0 - 1 (%)   Basophils Absolute 0.0  0.0 - 0.1 (K/uL)  LIPASE, BLOOD      Component Value Range   Lipase 51  11 - 59 (U/L)  PROCALCITONIN      Component Value Range   Procalcitonin 0.24    URINALYSIS, ROUTINE W REFLEX MICROSCOPIC      Component Value Range   Color, Urine BROWN (*) YELLOW    APPearance CLOUDY (*) CLEAR    Specific Gravity, Urine 1.025  1.005 - 1.030    pH 6.5  5.0 - 8.0    Glucose, UA NEGATIVE  NEGATIVE (mg/dL)   Hgb urine dipstick LARGE (*) NEGATIVE    Bilirubin Urine MODERATE (*) NEGATIVE    Ketones, ur 15 (*) NEGATIVE (mg/dL)   Protein, ur >782 (*) NEGATIVE (mg/dL)   Urobilinogen, UA 1.0  0.0 - 1.0 (mg/dL)   Nitrite POSITIVE (*) NEGATIVE    Leukocytes, UA TRACE (*) NEGATIVE   GLUCOSE, CAPILLARY      Component Value Range   Glucose-Capillary 39 (*) 70 - 99 (mg/dL)   Comment 1 Notify RN    GLUCOSE, CAPILLARY      Component Value Range   Glucose-Capillary 68 (*) 70 - 99 (mg/dL)   Comment 1 Orig Pt Id entered as [id number]    LACTIC ACID, PLASMA      Component Value Range   Lactic Acid, Venous 8.8 (*) 0.5 - 2.2 (mmol/L)  GLUCOSE, CAPILLARY      Component Value Range   Glucose-Capillary 117 (*) 70 - 99 (mg/dL)  URINE MICROSCOPIC-ADD ON      Component Value Range   Squamous Epithelial / LPF FEW (*) RARE    WBC, UA 0-2  <3 (WBC/hpf)   RBC / HPF TOO NUMEROUS TO COUNT  <3 (RBC/hpf)   Bacteria, UA RARE  RARE    Casts GRANULAR CAST (*) NEGATIVE    Urine-Other MUCOUS PRESENT    POCT I-STAT 3, BLOOD GAS (G3+)      Component Value Range   pH, Arterial 7.275 (*) 7.350 - 7.400    pCO2 arterial 23.7 (*) 35.0 - 45.0 (mmHg)   pO2, Arterial 86.0  80.0 - 100.0 (mmHg)   Bicarbonate 11.0 (*)  20.0 - 24.0 (mEq/L)   TCO2 12  0 - 100 (mmol/L)   O2 Saturation 95.0     Acid-base deficit 14.0 (*) 0.0 - 2.0 (mmol/L)   Patient temperature 98.6  F     Collection site RADIAL, ALLEN'S TEST ACCEPTABLE     Drawn by Operator     Sample type ARTERIAL    COMPREHENSIVE METABOLIC PANEL      Component Value Range   Sodium 138  135 - 145 (mEq/L)   Potassium 7.0 (*) 3.5 - 5.1 (mEq/L)   Chloride 99  96 - 112 (mEq/L)   CO2 14 (*) 19 - 32 (mEq/L)   Glucose, Bld 28 (*) 70 - 99 (mg/dL)   BUN 87 (*) 6 - 23 (mg/dL)   Creatinine, Ser 4.54 (*) 0.50 - 1.10 (mg/dL)   Calcium 8.9  8.4 - 09.8 (mg/dL)   Total Protein 6.3  6.0 - 8.3 (g/dL)   Albumin 3.1 (*) 3.5 - 5.2 (g/dL)   AST 27  0 - 37 (U/L)   ALT 13  0 - 35 (U/L)   Alkaline Phosphatase 56  39 - 117 (U/L)   Total Bilirubin 0.2 (*) 0.3 - 1.2 (mg/dL)   GFR calc non Af Amer 4 (*) >90 (mL/min)   GFR calc Af Amer 5 (*) >90 (mL/min)  ACETAMINOPHEN LEVEL      Component Value Range   Acetaminophen (Tylenol), Serum <15.0  10 - 30 (ug/mL)  SALICYLATE LEVEL      Component Value Range   Salicylate Lvl <2.0 (*) 2.8 - 20.0 (mg/dL)   Dg Chest 2 View 02/29/1476  *RADIOLOGY REPORT*  Clinical Data: Confusion, shortness of breath, history hypertension, diabetes, coronary artery disease, hypertrophic cardiomyopathy  CHEST - 2 VIEW  Comparison: 12/29/2008  Findings: Left subclavian sequential transvenous pacemaker leads project at right atrium and right ventricle. Normal heart size post CABG. Minimally prominent left superior mediastinal soft tissues stable, question related to tortuous innominate artery. Atherosclerotic calcification aorta. Pulmonary vascularity normal. Chronic elevation right diaphragm. Minimal bibasilar atelectasis. Aeration at left base improved since previous exam. Upper lungs clear. Bones diffusely demineralized.  IMPRESSION: Post CABG and pacemaker. Minimal bibasilar atelectasis and chronic elevation right diaphragm.  Original Report Authenticated  By: Lollie Marrow, M.D.   Dg Pelvis 1-2 Views 05/07/2011  *RADIOLOGY REPORT*  Clinical Data: Hip discomfort post recent fall  PELVIS - 1-2 VIEW  Comparison: None  Findings: Osseous demineralization. Hip and SI joint spaces preserved. No acute fracture, dislocation, or bone destruction. Minimal increased attenuation of the femoral heads bilaterally likely degenerative in origin, corresponding to minimal subchondral sclerosis identified at the posterior aspects of the femoral heads on a prior CT. Scattered pelvic phleboliths. Degenerative disc and facet disease changes lower lumbar spine.  IMPRESSION: Osseous demineralization with minimal degenerative changes of the hip joints bilaterally. No definite acute osseous abnormalities. If patient has persistent symptoms or an inability to bear weight, consider MR imaging of the hip without contrast to exclude occult fracture.  Original Report Authenticated By: Lollie Marrow, M.D.   Ct Head Wo Contrast 05/07/2011  *RADIOLOGY REPORT*  Clinical Data: Hypoglycemia.  Unresponsive.  Improved with administration of glucose.  CT HEAD WITHOUT CONTRAST  Technique:  Contiguous axial images were obtained from the base of the skull through the vertex without contrast.  Comparison: 12/31/2008.  Findings: No intracranial hemorrhage.  Interval development of hypodensity within the right paracentral pontine region.  This may represent a small acute infarct.  Result of artifact not excluded.  Small vessel disease type changes.  Global atrophy without hydrocephalus.  Vascular calcifications.  No intracranial mass lesion detected on this unenhanced exam.  Opacification in the left sphenoid sinus.  IMPRESSION: Interval  development of hypodensity within the right paracentral pontine region.  This may represent a small acute infarct.  Result of artifact not excluded.  Opacification left sphenoid sinus.  Original Report Authenticated By: Fuller Canada, M.D.    Results for JINI, HORIUCHI  (MRN [id number]) as of 05/07/2011 13:27  Ref. Range 12/29/2008 15:49 12/31/2008 04:35 11/08/2010 09:15 05/07/2011 10:55  BUN Latest Range: 6-23 mg/dL 12 12 19  83 (H)  Creat Latest Range: 0.50-1.10 mg/dL 5.78 4.69 1.0 6.29 (H)           Laray Anger, DO 05/08/11 1747

## 2011-05-08 ENCOUNTER — Encounter (HOSPITAL_COMMUNITY): Payer: Self-pay | Admitting: Urology

## 2011-05-08 LAB — BASIC METABOLIC PANEL WITH GFR
BUN: 86 mg/dL — ABNORMAL HIGH (ref 6–23)
CO2: 23 meq/L (ref 19–32)
Calcium: 7.9 mg/dL — ABNORMAL LOW (ref 8.4–10.5)
Chloride: 98 meq/L (ref 96–112)
Creatinine, Ser: 5.4 mg/dL — ABNORMAL HIGH (ref 0.50–1.10)
GFR calc Af Amer: 8 mL/min — ABNORMAL LOW
GFR calc non Af Amer: 7 mL/min — ABNORMAL LOW
Glucose, Bld: 195 mg/dL — ABNORMAL HIGH (ref 70–99)
Potassium: 4.4 meq/L (ref 3.5–5.1)
Sodium: 141 meq/L (ref 135–145)

## 2011-05-08 LAB — CBC
HCT: 37.2 % (ref 36.0–46.0)
Hemoglobin: 13 g/dL (ref 12.0–15.0)
MCH: 31.2 pg (ref 26.0–34.0)
MCHC: 34.9 g/dL (ref 30.0–36.0)
MCV: 89.2 fL (ref 78.0–100.0)
Platelets: 286 10*3/uL (ref 150–400)
RBC: 4.17 MIL/uL (ref 3.87–5.11)
RDW: 14.2 % (ref 11.5–15.5)
WBC: 10.8 10*3/uL — ABNORMAL HIGH (ref 4.0–10.5)

## 2011-05-08 LAB — URINE CULTURE: Culture: NO GROWTH

## 2011-05-08 LAB — GLUCOSE, CAPILLARY
Glucose-Capillary: 121 mg/dL — ABNORMAL HIGH (ref 70–99)
Glucose-Capillary: 147 mg/dL — ABNORMAL HIGH (ref 70–99)
Glucose-Capillary: 194 mg/dL — ABNORMAL HIGH (ref 70–99)
Glucose-Capillary: 40 mg/dL — CL (ref 70–99)
Glucose-Capillary: 45 mg/dL — ABNORMAL LOW (ref 70–99)

## 2011-05-08 MED ORDER — INSULIN ASPART 100 UNIT/ML ~~LOC~~ SOLN
0.0000 [IU] | Freq: Three times a day (TID) | SUBCUTANEOUS | Status: DC
  Administered 2011-05-08: 9 [IU] via SUBCUTANEOUS

## 2011-05-08 MED ORDER — HYDROCORTISONE SOD SUCCINATE 100 MG IJ SOLR
50.0000 mg | Freq: Every day | INTRAMUSCULAR | Status: DC
  Administered 2011-05-09 – 2011-05-10 (×2): 50 mg via INTRAVENOUS
  Filled 2011-05-08 (×2): qty 1

## 2011-05-08 MED ORDER — FUROSEMIDE 10 MG/ML IJ SOLN
40.0000 mg | Freq: Two times a day (BID) | INTRAMUSCULAR | Status: AC
  Administered 2011-05-08 – 2011-05-09 (×2): 40 mg via INTRAVENOUS
  Filled 2011-05-08 (×2): qty 4

## 2011-05-08 MED ORDER — GLUCOSE 40 % PO GEL: 1.0000 | ORAL | Status: DC | PRN

## 2011-05-08 MED ORDER — DEXTROSE 50 % IV SOLN
50.0000 mL | Freq: Once | INTRAVENOUS | Status: AC | PRN
  Administered 2011-05-08: 50 mL via INTRAVENOUS

## 2011-05-08 MED ORDER — GLUCAGON HCL (RDNA) 1 MG IJ SOLR: 0.5000 mg | Freq: Once | INTRAMUSCULAR | Status: AC | PRN

## 2011-05-08 MED ORDER — DEXTROSE 50 % IV SOLN: 50.0000 mL | Freq: Once | INTRAVENOUS | Status: AC | PRN

## 2011-05-08 MED ORDER — DEXTROSE 5 % IV SOLN: INTRAVENOUS | Status: DC

## 2011-05-08 MED ORDER — GLUCAGON HCL (RDNA) 1 MG IJ SOLR: 1.0000 mg | Freq: Once | INTRAMUSCULAR | Status: AC | PRN

## 2011-05-08 MED ORDER — INSULIN ASPART 100 UNIT/ML ~~LOC~~ SOLN
0.0000 [IU] | Freq: Every day | SUBCUTANEOUS | Status: DC
  Administered 2011-05-08 – 2011-05-12 (×3): 2 [IU] via SUBCUTANEOUS

## 2011-05-08 MED ORDER — GLUCOSE 40 % PO GEL: 1.0000 | Freq: Once | ORAL | Status: AC | PRN

## 2011-05-08 MED ORDER — DEXTROSE 50 % IV SOLN: 25.0000 mL | Freq: Once | INTRAVENOUS | Status: AC | PRN

## 2011-05-08 MED ORDER — SODIUM POLYSTYRENE SULFONATE 15 GM/60ML PO SUSP
15.0000 g | Freq: Once | ORAL | Status: AC
  Administered 2011-05-08: 15 g via ORAL
  Filled 2011-05-08: qty 60

## 2011-05-08 MED ORDER — INSULIN ASPART 100 UNIT/ML ~~LOC~~ SOLN
0.0000 [IU] | Freq: Three times a day (TID) | SUBCUTANEOUS | Status: DC
  Administered 2011-05-09: 2 [IU] via SUBCUTANEOUS
  Administered 2011-05-09: 8 [IU] via SUBCUTANEOUS
  Administered 2011-05-09: 3 [IU] via SUBCUTANEOUS
  Administered 2011-05-10: 5 [IU] via SUBCUTANEOUS
  Administered 2011-05-10: 8 [IU] via SUBCUTANEOUS
  Administered 2011-05-11 – 2011-05-12 (×2): 3 [IU] via SUBCUTANEOUS
  Administered 2011-05-13: 2 [IU] via SUBCUTANEOUS

## 2011-05-08 NOTE — Progress Notes (Signed)
  Subjective: Patient reports awake, but minimally communicative. Able to feed herself.  78 yo female, 10 yrs post R nephrectomy for benign disease, now with L 6mm upper ureteral stone with L ureteral obstruction and acute renal failure. She is post L JJ stent last night. Cr today shows slight improvement.   Objective: Vital signs in last 24 hours: Temp:  [98 F (36.7 C)-98.5 F (36.9 C)] 98.1 F (36.7 C) (03/28 0746) Pulse Rate:  [80-96] 92  (03/28 0700) Resp:  [17-30] 17  (03/28 0700) BP: (127-185)/(57-112) 143/79 mmHg (03/28 0700) SpO2:  [89 %-99 %] 91 % (03/28 0700) Weight:  [72 kg (158 lb 11.7 oz)] 72 kg (158 lb 11.7 oz) (03/27 2057)A  Intake/Output from previous day: 03/27 0701 - 03/28 0700 In: 4406.7 [P.O.:360; I.V.:1196.7] Out: 201 [Urine:200; Stool:1] Intake/Output this shift: Total I/O In: 200 [I.V.:200] Out: -   Past Medical History  Diagnosis Date  . Pacemaker   . CAD (coronary artery disease)   . CHB (complete heart block)   . HTN (hypertension), benign   . DM (diabetes mellitus)   . Hypertrophic cardiomyopathy   . Hyperlipidemia   . OP (osteoporosis)   . Hx: UTI (urinary tract infection)   . Fatigue   . Rheumatoid arthritis   . Chronic steroid use   . Confusion   . Pancreatic pseudocyst   . Diverticulosis     Physical Exam:  General:wdwnwf, awake and responsive. Able to feed herself.  Lungs - Normal respiratory effort, chest expands symmetrically.  Abdomen - Soft, non-tender & non-distended.  Lab Results:  Basename 05/08/11 0552 05/07/11 1839 05/07/11 1055  WBC 10.8* -- 15.0*  HGB 13.0 13.6 13.8  HCT 37.2 40.0 41.5   BMET  Basename 05/08/11 0552 05/07/11 2224  NA 141 140  K 4.4 5.9*  CL 98 96  CO2 23 16*  GLUCOSE 195* 73  BUN 86* 88*  CREATININE 5.40* 7.26*  CALCIUM 7.9* 8.5   No results found for this basename: LABURIN:1 in the last 72 hours Results for orders placed during the hospital encounter of 05/07/11  MRSA PCR SCREENING      Status: Normal   Collection Time   05/07/11  8:43 PM      Component Value Range Status Comment   MRSA by PCR NEGATIVE  NEGATIVE  Final     Studies/Results: @[id number] @  Assessment/Plan: Pt is slowly improving post JJ stent. We will leave JJ stent in for now, and will follow up as op for planning for probable surgical removal of her stone. Note that she is on Plavix, and this will need to be addressed during surgical planning. We will wait until her Cr has reached her baseline.  Her son is in attendance.   Aliea Bobe I 05/08/2011, 9:24 AM

## 2011-05-08 NOTE — Progress Notes (Signed)
Utilization Review Completed.  [name] T  05/08/2011  

## 2011-05-08 NOTE — Progress Notes (Signed)
[facility]  Subjective: Her vomiting and abdominal pain has resolved. She has began to eat and is eating well.   Objective: Blood pressure 121/63, pulse 89, temperature 97.9 F (36.6 C), temperature source Oral, resp. rate 22, height 5\' 4"  (1.626 m), weight 72 kg (158 lb 11.7 oz), SpO2 93.00%. Weight change:   Intake/Output Summary (Last 24 hours) at 05/08/11 1703 Last data filed at 05/08/11 1426  Gross per 24 hour  Intake 4926.67 ml  Output    201 ml  Net 4725.67 ml    Physical Exam: General appearance: alert and cooperative Lungs: coarse basilar crackles bilaterally Heart: regular rate and rhythm, S1, S2 normal, no murmur, click, rub or gallop Abdomen: soft, non-tender; bowel sounds normal; no masses,  no organomegaly Extremities: extremities normal, atraumatic, no cyanosis or edema  Lab Results:  [facility] 05/08/11 0552 05/07/11 2224  NA 141 140  K 4.4 5.9*  CL 98 96  CO2 23 16*  GLUCOSE 195* 73  BUN 86* 88*  CREATININE 5.40* 7.26*  CALCIUM 7.9* 8.5  MG -- --  PHOS -- --    Basename 05/07/11 1452 05/07/11 1055  AST 27 25  ALT 13 14  ALKPHOS 56 59  BILITOT 0.2* 0.2*  PROT 6.3 6.7  ALBUMIN 3.1* 3.2*    Basename 05/07/11 1055  LIPASE 51  AMYLASE --    Basename 05/08/11 0552 05/07/11 1839 05/07/11 1055  WBC 10.8* -- 15.0*  NEUTROABS -- -- 12.6*  HGB 13.0 13.6 --  HCT 37.2 40.0 --  MCV 89.2 -- 92.4  PLT 286 -- 293    Basename 05/07/11 2224  CKTOTAL --  CKMB --  CKMBINDEX --  TROPONINI <0.30   No components found with this basename: POCBNP:3 No results found for this basename: DDIMER:2 in the last 72 hours No results found for this basename: HGBA1C:2 in the last 72 hours No results found for this basename: CHOL:2,HDL:2,LDLCALC:2,TRIG:2,CHOLHDL:2,LDLDIRECT:2 in the last 72 hours No results found for this basename: TSH,T4TOTAL,FREET3,T3FREE,THYROIDAB in the last 72 hours No results found for this basename:  VITAMINB12:2,FOLATE:2,FERRITIN:2,TIBC:2,IRON:2,RETICCTPCT:2 in the last 72 hours  Micro Results: Recent Results (from the past 240 hour(s))  URINE CULTURE     Status: Normal   Collection Time   05/07/11 12:04 PM      Component Value Range Status Comment   Specimen Description URINE, CATHETERIZED   Final    Special Requests NONE   Final    Culture  Setup Time [id number]   Final    Colony Count NO GROWTH   Final    Culture NO GROWTH   Final    Report Status 05/08/2011 FINAL   Final   MRSA PCR SCREENING     Status: Normal   Collection Time   05/07/11  8:43 PM      Component Value Range Status Comment   MRSA by PCR NEGATIVE  NEGATIVE  Final     Studies/Results: Ct Abdomen Pelvis Wo Contrast  05/07/2011  *RADIOLOGY REPORT*  Clinical Data: Acute renal failure.  Abdominal pain  CT ABDOMEN AND PELVIS WITHOUT CONTRAST  Technique:  Multidetector CT imaging of the abdomen and pelvis was performed following the standard protocol without intravenous contrast.  Comparison: CT 10/26/2008  Findings: Obstruction of the left kidney with hydronephrosis and perinephric stranding.  7 mm stone in the proximal left ureter causing high grade obstruction of the left kidney.  Right kidney has been removed.  Lung bases are clear.  15 mm hypodensity in the left lobe  of the liver is unchanged from the  prior study and is compatible with a cyst.  No new liver lesions are seen.  Gallstones are present layering in the gallbladder.  Bile ducts are not dilated.  Pancreas and spleen are normal.  Negative for bowel obstruction.  Sigmoid diverticulosis is present. Pessary is present in the vagina.  Chronic fracture of L1. Degenerative changes in the lumbar spine. Atherosclerotic aorta without aneurysm.  IMPRESSION: High-grade obstruction of the left kidney due to a 7 mm stone in the proximal left ureter.  Right kidney is surgically absent.  Original Report Authenticated By: Camelia Phenes, M.D.   Dg Chest 2 View  05/07/2011   *RADIOLOGY REPORT*  Clinical Data: Confusion, shortness of breath, history hypertension, diabetes, coronary artery disease, hypertrophic cardiomyopathy  CHEST - 2 VIEW  Comparison: 12/29/2008  Findings: Left subclavian sequential transvenous pacemaker leads project at right atrium and right ventricle. Normal heart size post CABG. Minimally prominent left superior mediastinal soft tissues stable, question related to tortuous innominate artery. Atherosclerotic calcification aorta. Pulmonary vascularity normal. Chronic elevation right diaphragm. Minimal bibasilar atelectasis. Aeration at left base improved since previous exam. Upper lungs clear. Bones diffusely demineralized.  IMPRESSION: Post CABG and pacemaker. Minimal bibasilar atelectasis and chronic elevation right diaphragm.  Original Report Authenticated By: Lollie Marrow, M.D.   Dg Pelvis 1-2 Views  05/07/2011  *RADIOLOGY REPORT*  Clinical Data: Hip discomfort post recent fall  PELVIS - 1-2 VIEW  Comparison: None  Findings: Osseous demineralization. Hip and SI joint spaces preserved. No acute fracture, dislocation, or bone destruction. Minimal increased attenuation of the femoral heads bilaterally likely degenerative in origin, corresponding to minimal subchondral sclerosis identified at the posterior aspects of the femoral heads on a prior CT. Scattered pelvic phleboliths. Degenerative disc and facet disease changes lower lumbar spine.  IMPRESSION: Osseous demineralization with minimal degenerative changes of the hip joints bilaterally. No definite acute osseous abnormalities. If patient has persistent symptoms or an inability to bear weight, consider MR imaging of the hip without contrast to exclude occult fracture.  Original Report Authenticated By: Lollie Marrow, M.D.  Ct Head Wo Contrast  05/07/2011  *RADIOLOGY REPORT*  Clinical Data: Hypoglycemia.  Unresponsive.  Improved with administration of glucose.  CT HEAD WITHOUT CONTRAST  Technique:   Contiguous axial images were obtained from the base of the skull through the vertex without contrast.  Comparison: 12/31/2008.  Findings: No intracranial hemorrhage.  Interval development of hypodensity within the right paracentral pontine region.  This may represent a small acute infarct.  Result of artifact not excluded.  Small vessel disease type changes.  Global atrophy without hydrocephalus.  Vascular calcifications.  No intracranial mass lesion detected on this unenhanced exam.  Opacification in the left sphenoid sinus.  IMPRESSION: Interval development of hypodensity within the right paracentral pontine region.  This may represent a small acute infarct.  Result of artifact not excluded.  Opacification left sphenoid sinus.  Original Report Authenticated By: Fuller Canada, M.D.   Dg Cystogram  05/07/2011  *RADIOLOGY REPORT*  Clinical Data: Ureteral obstruction.  Ureteral stent placement.  Comparison:  CT scan, same date.  Findings: Single fluoroscopic spot image demonstrates a left-sided ureteral stent with the tip in the region of the renal pelvis.  IMPRESSION: Left ureteral stent placement.  Original Report Authenticated By: P. Loralie Champagne, M.D.    Medications: Scheduled Meds:   . cefTRIAXone (ROCEPHIN)  IV  1 g Intravenous Q24H  . dextrose      .  furosemide  40 mg Intravenous Q12H  . hydrocortisone sod succinate (SOLU-CORTEF) injection  50 mg Intravenous Q12H  . insulin aspart  0-5 Units Subcutaneous QHS  . insulin aspart  0-9 Units Subcutaneous TID WC  . latanoprost  1 drop Left Eye QHS  . metoprolol  25 mg Oral BID  . sodium chloride  3 mL Intravenous Q12H  . sodium polystyrene  15 g Oral Once  . sodium polystyrene  30 g Oral Once   Continuous Infusions:   . dextrose    . dextrose    . dextrose    . DISCONTD: sodium chloride 75 mL/hr at 05/07/11 1053  . DISCONTD: dextrose 5 % and 0.9% NaCl 75 mL/hr at 05/07/11 1243  . DISCONTD:  sodium bicarbonate infusion 1000 mL 100 mL/hr  at 05/08/11 0600   PRN Meds:.acetaminophen, acetaminophen, albuterol, dextrose, dextrose, dextrose, dextrose, dextrose, dextrose, dextrose, dextrose, dextrose, glucagon, glucagon, hydrALAZINE, morphine, ondansetron (ZOFRAN) IV, ondansetron, DISCONTD: fentaNYL, DISCONTD: iohexol, DISCONTD: metoCLOPramide, DISCONTD: sterile water for irrigation  Assessment/Plan: Principal Problem:  *Acute renal failure (ARF)/ ATN Due to Nephrolithiasis s/p Left J stent being followed by Urology Cr improving.    Hyperkalemia and metabolic acidosis Improving as Cr improves. Will stop Bicarb drip.    DM type 2 (diabetes mellitus, type 2) Sugars improving as appetite increases. Also due to solu-cortef.  Will need to hold PO hypoglycemics due to ARF Increase sliding scale to moderate  Pulmonary edema and mild hypoxia Will give 2 doses of Lasix and hold IVF for now. She has began to eat/drink well. Will order an ECHO.    Pacemaker   CAD (coronary artery disease) Plavix and ASA on hold for possible surgery.    HTN (hypertension), benign  Lopressor resumed     LOS: 1 day   [facility] [phone] 05/08/2011, 5:03 PM

## 2011-05-09 DIAGNOSIS — I517 Cardiomegaly: Secondary | ICD-10-CM

## 2011-05-09 LAB — CBC
HCT: 38.9 % (ref 36.0–46.0)
Hemoglobin: 13.1 g/dL (ref 12.0–15.0)
MCH: 30.3 pg (ref 26.0–34.0)
MCHC: 33.7 g/dL (ref 30.0–36.0)

## 2011-05-09 LAB — BASIC METABOLIC PANEL
BUN: 63 mg/dL — ABNORMAL HIGH (ref 6–23)
Chloride: 98 mEq/L (ref 96–112)
GFR calc Af Amer: 19 mL/min — ABNORMAL LOW (ref >90)
Glucose, Bld: 113 mg/dL — ABNORMAL HIGH (ref 70–99)
Potassium: 2.8 mEq/L — ABNORMAL LOW (ref 3.5–5.1)

## 2011-05-09 LAB — GLUCOSE, CAPILLARY

## 2011-05-09 MED ORDER — CHLORDIAZEPOXIDE HCL 5 MG PO CAPS
5.0000 mg | ORAL_CAPSULE | Freq: Every evening | ORAL | Status: DC | PRN
  Administered 2011-05-09: 5 mg via ORAL
  Filled 2011-05-09: qty 1

## 2011-05-09 MED ORDER — POTASSIUM CHLORIDE CRYS ER 20 MEQ PO TBCR
40.0000 meq | EXTENDED_RELEASE_TABLET | ORAL | Status: AC
  Administered 2011-05-09 (×3): 40 meq via ORAL
  Filled 2011-05-09: qty 2
  Filled 2011-05-09: qty 1
  Filled 2011-05-09: qty 2
  Filled 2011-05-09: qty 1

## 2011-05-09 MED ORDER — SODIUM CHLORIDE 0.9 % IV SOLN: INTRAVENOUS | Status: DC

## 2011-05-09 MED ORDER — AMITRIPTYLINE HCL 25 MG PO TABS
12.5000 mg | ORAL_TABLET | Freq: Every day | ORAL | Status: DC
  Administered 2011-05-10 – 2011-05-12 (×3): 12.5 mg via ORAL
  Filled 2011-05-09 (×5): qty 0.5

## 2011-05-09 MED ORDER — ACIDOPHILUS PO CAPS: 1.0000 | ORAL_CAPSULE | Freq: Every day | ORAL | Status: DC

## 2011-05-09 MED ORDER — CHLORDIAZEPOXIDE-AMITRIPTYLINE 5-12.5 MG PO TABS
1.0000 | ORAL_TABLET | Freq: Every day | ORAL | Status: DC
  Administered 2011-05-09: 1 via ORAL
  Filled 2011-05-09: qty 1

## 2011-05-09 MED ORDER — HALOPERIDOL 1 MG PO TABS
1.0000 mg | ORAL_TABLET | Freq: Once | ORAL | Status: AC
  Administered 2011-05-09: 1 mg via ORAL
  Filled 2011-05-09 (×2): qty 1

## 2011-05-09 MED ORDER — RISAQUAD PO CAPS
1.0000 | ORAL_CAPSULE | Freq: Every day | ORAL | Status: DC
  Administered 2011-05-09 – 2011-05-13 (×5): 1 via ORAL
  Filled 2011-05-09 (×5): qty 1

## 2011-05-09 NOTE — Progress Notes (Signed)
*  PRELIMINARY RESULTS* Echocardiogram 2D Echocardiogram has been performed.  Glean Salen [facility] 05/09/2011, 10:39 AM

## 2011-05-09 NOTE — Progress Notes (Signed)
Urology Progress Note  Subjective:     No acute urologic events overnight. No complaints.  ROS: Had some nausea controlled with meds.  Objective:  Patient Vitals for the past 24 hrs:  BP Temp Temp src Pulse Resp SpO2  05/09/11 1950 152/88 mmHg 97.5 F (36.4 C) Oral 97  22  95 %  05/09/11 1600 139/86 mmHg 98.4 F (36.9 C) Oral 94  20  96 %  05/09/11 1400 - - - 105  13  96 %  05/09/11 1300 - - - 92  22  95 %  05/09/11 1205 149/77 mmHg 98.2 F (36.8 C) Oral 87  21  95 %  05/09/11 1100 - - - 90  19  97 %  05/09/11 1005 121/65 mmHg - - 97  - -  05/09/11 1000 - - - 76  20  95 %  05/09/11 0953 121/65 mmHg - - 72  19  96 %  05/09/11 0900 - - - 101  21  96 %  05/09/11 0800 - - - 106  23  93 %  05/09/11 0742 145/75 mmHg 97.8 F (36.6 C) Oral 96  21  96 %  05/09/11 0700 - - - 108  19  93 %  05/09/11 0400 152/62 mmHg - - 88  - 96 %  05/09/11 0357 - 97.6 F (36.4 C) Oral - - -  05/09/11 0000 136/69 mmHg - - 80  18  92 %  05/08/11 2352 - 97.5 F (36.4 C) Oral - - -  05/08/11 2113 - - - 84  - -    Physical Exam: General:  No acute distress, awake Abdomen:               []  Soft, appropriately TTP  [x]  Soft, NTTP  []  Soft, appropriately TTP, incision(s) clean/dry/intact  Genitourinary: Foley in place Foley:  Draining clear yellow urine in tubing    I/O last 3 completed shifts: In: 3668 [P.O.:1340; I.V.:478; Other:1850] Out: 2125 [Urine:2125]  Recent Labs  Basename 05/09/11 0555 05/08/11 0552   HGB 13.1 13.0   WBC 9.0 10.8*   PLT 242 286    Recent Labs  Basename 05/09/11 0555 05/08/11 0552   NA 143 141   K 2.8* 4.4   CL 98 98   CO2 30 23   BUN 63* 86*   CREATININE 2.57* 5.40*   CALCIUM 7.5* 7.9*   GFRNONAA 16* 7*   GFRAA 19* 8*     No results found for this basename: PT:2,INR:2,APTT:2 in the last 72 hours   No components found with this basename: ABG:2    Length of stay: 2 days.  Assessment: Left ureter stone. POD#2 left ureter stent placement. Solitary  left kidney.  Plan: Renal function continues to improve. Good urine output. No new recommendations at this time. Patient to f/u as an outpatient with Dr. Patsi Sears for definitive stone management once she has recovered adequately.   Natalia Leatherwood, MD [phone]

## 2011-05-09 NOTE — Progress Notes (Signed)
[facility]  Subjective: She did not sleep well last night as she has not received her sleeping pill in the hospital. She is quite anxious today and her son suspects it is due to the lack of sleep. She is otherwise doing well and continues to do well.   Objective: Blood pressure 149/77, pulse 87, temperature 98.2 F (36.8 C), temperature source Oral, resp. rate 21, height 5\' 4"  (1.626 m), weight 72 kg (158 lb 11.7 oz), SpO2 95.00%. Weight change:   Intake/Output Summary (Last 24 hours) at 05/09/11 1319 Last data filed at 05/09/11 1200  Gross per 24 hour  Intake   4103 ml  Output    400 ml  Net   3703 ml    Physical Exam: General appearance: alert and cooperative Lungs: coarse basilar crackles bilaterally Heart: regular rate and rhythm, S1, S2 normal, no murmur, click, rub or gallop Abdomen: soft, non-tender; bowel sounds normal; no masses,  no organomegaly Extremities: extremities normal, atraumatic, no cyanosis or edema  Lab Results:  [facility] 05/09/11 0555 05/08/11 0552  NA 143 141  K 2.8* 4.4  CL 98 98  CO2 30 23  GLUCOSE 113* 195*  BUN 63* 86*  CREATININE 2.57* 5.40*  CALCIUM 7.5* 7.9*  MG -- --  PHOS -- --    Basename 05/07/11 1452 05/07/11 1055  AST 27 25  ALT 13 14  ALKPHOS 56 59  BILITOT 0.2* 0.2*  PROT 6.3 6.7  ALBUMIN 3.1* 3.2*    Basename 05/07/11 1055  LIPASE 51  AMYLASE --    Basename 05/09/11 0555 05/08/11 0552 05/07/11 1055  WBC 9.0 10.8* --  NEUTROABS -- -- 12.6*  HGB 13.1 13.0 --  HCT 38.9 37.2 --  MCV 90.0 89.2 --  PLT 242 286 --    Basename 05/07/11 2224  CKTOTAL --  CKMB --  CKMBINDEX --  TROPONINI <0.30   No components found with this basename: POCBNP:3 No results found for this basename: DDIMER:2 in the last 72 hours No results found for this basename: HGBA1C:2 in the last 72 hours No results found for this basename: CHOL:2,HDL:2,LDLCALC:2,TRIG:2,CHOLHDL:2,LDLDIRECT:2 in the last 72 hours No results found for this  basename: TSH,T4TOTAL,FREET3,T3FREE,THYROIDAB in the last 72 hours No results found for this basename: VITAMINB12:2,FOLATE:2,FERRITIN:2,TIBC:2,IRON:2,RETICCTPCT:2 in the last 72 hours  Micro Results: Recent Results (from the past 240 hour(s))  URINE CULTURE     Status: Normal   Collection Time   05/07/11 12:04 PM      Component Value Range Status Comment   Specimen Description URINE, CATHETERIZED   Final    Special Requests NONE   Final    Culture  Setup Time [id number]   Final    Colony Count NO GROWTH   Final    Culture NO GROWTH   Final    Report Status 05/08/2011 FINAL   Final   MRSA PCR SCREENING     Status: Normal   Collection Time   05/07/11  8:43 PM      Component Value Range Status Comment   MRSA by PCR NEGATIVE  NEGATIVE  Final     Studies/Results: Ct Abdomen Pelvis Wo Contrast  05/07/2011  *RADIOLOGY REPORT*  Clinical Data: Acute renal failure.  Abdominal pain  CT ABDOMEN AND PELVIS WITHOUT CONTRAST  Technique:  Multidetector CT imaging of the abdomen and pelvis was performed following the standard protocol without intravenous contrast.  Comparison: CT 10/26/2008  Findings: Obstruction of the left kidney with hydronephrosis and perinephric stranding.  7 mm  stone in the proximal left ureter causing high grade obstruction of the left kidney.  Right kidney has been removed.  Lung bases are clear.  15 mm hypodensity in the left lobe of the liver is unchanged from the  prior study and is compatible with a cyst.  No new liver lesions are seen.  Gallstones are present layering in the gallbladder.  Bile ducts are not dilated.  Pancreas and spleen are normal.  Negative for bowel obstruction.  Sigmoid diverticulosis is present. Pessary is present in the vagina.  Chronic fracture of L1. Degenerative changes in the lumbar spine. Atherosclerotic aorta without aneurysm.  IMPRESSION: High-grade obstruction of the left kidney due to a 7 mm stone in the proximal left ureter.  Right kidney is  surgically absent.  Original Report Authenticated By: Camelia Phenes, M.D.   Dg Chest 2 View  05/07/2011  *RADIOLOGY REPORT*  Clinical Data: Confusion, shortness of breath, history hypertension, diabetes, coronary artery disease, hypertrophic cardiomyopathy  CHEST - 2 VIEW  Comparison: 12/29/2008  Findings: Left subclavian sequential transvenous pacemaker leads project at right atrium and right ventricle. Normal heart size post CABG. Minimally prominent left superior mediastinal soft tissues stable, question related to tortuous innominate artery. Atherosclerotic calcification aorta. Pulmonary vascularity normal. Chronic elevation right diaphragm. Minimal bibasilar atelectasis. Aeration at left base improved since previous exam. Upper lungs clear. Bones diffusely demineralized.  IMPRESSION: Post CABG and pacemaker. Minimal bibasilar atelectasis and chronic elevation right diaphragm.  Original Report Authenticated By: Lollie Marrow, M.D.   Dg Pelvis 1-2 Views  05/07/2011  *RADIOLOGY REPORT*  Clinical Data: Hip discomfort post recent fall  PELVIS - 1-2 VIEW  Comparison: None  Findings: Osseous demineralization. Hip and SI joint spaces preserved. No acute fracture, dislocation, or bone destruction. Minimal increased attenuation of the femoral heads bilaterally likely degenerative in origin, corresponding to minimal subchondral sclerosis identified at the posterior aspects of the femoral heads on a prior CT. Scattered pelvic phleboliths. Degenerative disc and facet disease changes lower lumbar spine.  IMPRESSION: Osseous demineralization with minimal degenerative changes of the hip joints bilaterally. No definite acute osseous abnormalities. If patient has persistent symptoms or an inability to bear weight, consider MR imaging of the hip without contrast to exclude occult fracture.  Original Report Authenticated By: Lollie Marrow, M.D.  Ct Head Wo Contrast  05/07/2011  *RADIOLOGY REPORT*  Clinical Data:  Hypoglycemia.  Unresponsive.  Improved with administration of glucose.  CT HEAD WITHOUT CONTRAST  Technique:  Contiguous axial images were obtained from the base of the skull through the vertex without contrast.  Comparison: 12/31/2008.  Findings: No intracranial hemorrhage.  Interval development of hypodensity within the right paracentral pontine region.  This may represent a small acute infarct.  Result of artifact not excluded.  Small vessel disease type changes.  Global atrophy without hydrocephalus.  Vascular calcifications.  No intracranial mass lesion detected on this unenhanced exam.  Opacification in the left sphenoid sinus.  IMPRESSION: Interval development of hypodensity within the right paracentral pontine region.  This may represent a small acute infarct.  Result of artifact not excluded.  Opacification left sphenoid sinus.  Original Report Authenticated By: Fuller Canada, M.D.   Dg Cystogram  05/07/2011  *RADIOLOGY REPORT*  Clinical Data: Ureteral obstruction.  Ureteral stent placement.  Comparison:  CT scan, same date.  Findings: Single fluoroscopic spot image demonstrates a left-sided ureteral stent with the tip in the region of the renal pelvis.  IMPRESSION: Left ureteral stent placement.  Original Report Authenticated By: P. Loralie Champagne, M.D.    Medications: Scheduled Meds:    . Acidophilus  1 capsule Oral Daily  . amitriptyline-chlordiazePOXIDE  1 tablet Oral QHS  . cefTRIAXone (ROCEPHIN)  IV  1 g Intravenous Q24H  . furosemide  40 mg Intravenous Q12H  . hydrocortisone sod succinate (SOLU-CORTEF) injection  50 mg Intravenous Daily  . insulin aspart  0-15 Units Subcutaneous TID WC  . insulin aspart  0-5 Units Subcutaneous QHS  . latanoprost  1 drop Left Eye QHS  . metoprolol  25 mg Oral BID  . potassium chloride  40 mEq Oral Q4H  . sodium chloride  3 mL Intravenous Q12H  . DISCONTD: hydrocortisone sod succinate (SOLU-CORTEF) injection  50 mg Intravenous Q12H  . DISCONTD:  insulin aspart  0-9 Units Subcutaneous TID WC   Continuous Infusions:    . dextrose    . DISCONTD: dextrose    . DISCONTD: dextrose     PRN Meds:.acetaminophen, acetaminophen, albuterol, dextrose, dextrose, dextrose, dextrose, dextrose, dextrose, dextrose, glucagon, glucagon, hydrALAZINE, morphine, ondansetron (ZOFRAN) IV, ondansetron  Assessment/Plan: Principal Problem:  *Acute renal failure (ARF)/ ATN Due to Nephrolithiasis s/p Left J stent being followed by Urology Cr improving.    Hyperkalemia/ hypokalmeia   Improving as Cr improves.  K is low today due to kayexalate that was given. Will now replace.   Metabolic acidosis Resolved with improvement in renal function   DM type 2 (diabetes mellitus, type 2) Sugars increased as appetite increases. Also due to solu-cortef.  Will need to hold PO hypoglycemics due to ARF Increased sliding scale to moderate- sugars well controlled today  Pulmonary edema and mild hypoxia Doing better after 2 doses of Lasix given. Will stop now. ECHO done today. Will follow. Hold off on IVF as she is drinking well.    Pacemaker   CAD (coronary artery disease) Plavix and ASA on hold for possible surgery.    HTN (hypertension), benign  Lopressor resumed     LOS: 2 days   [facility] [phone] 05/09/2011, 1:19 PM

## 2011-05-10 LAB — GLUCOSE, CAPILLARY: Glucose-Capillary: 134 mg/dL — ABNORMAL HIGH (ref 70–99)

## 2011-05-10 LAB — BASIC METABOLIC PANEL
BUN: 40 mg/dL — ABNORMAL HIGH (ref 6–23)
BUN: 37 mg/dL — ABNORMAL HIGH (ref 6–23)
Calcium: 7.8 mg/dL — ABNORMAL LOW (ref 8.4–10.5)
Chloride: 99 mEq/L (ref 96–112)
GFR calc Af Amer: 38 mL/min — ABNORMAL LOW (ref >90)
GFR calc non Af Amer: 31 mL/min — ABNORMAL LOW (ref >90)
Glucose, Bld: 122 mg/dL — ABNORMAL HIGH (ref 70–99)
Glucose, Bld: 308 mg/dL — ABNORMAL HIGH (ref 70–99)
Potassium: 4.1 mEq/L (ref 3.5–5.1)
Sodium: 139 mEq/L (ref 135–145)

## 2011-05-10 MED ORDER — INSULIN ASPART 100 UNIT/ML ~~LOC~~ SOLN
3.0000 [IU] | Freq: Three times a day (TID) | SUBCUTANEOUS | Status: DC
  Administered 2011-05-10 – 2011-05-13 (×7): 3 [IU] via SUBCUTANEOUS

## 2011-05-10 NOTE — Progress Notes (Signed)
RN assessed pt around 0730--when reentered room at 0810 to check CBG--pt had finally fallen to sleep. Pt has not slept well for the past 48 hours and has had increasing confusion and agitation. RN spoke with pt's son ( who has not been able to leave his mother's side because of her reaction of distress when he is not present) and decided to forgo am CBG stick and let the pt rest.

## 2011-05-10 NOTE — Progress Notes (Signed)
Urology Progress Note  Subjective:     No acute urologic events overnight. Patient asleep.   Objective:  Patient Vitals for the past 24 hrs:  BP Temp Temp src Pulse Resp SpO2  05/10/11 0407 143/71 mmHg 98.1 F (36.7 C) Oral 89  20  95 %  05/09/11 2333 - 98.3 F (36.8 C) Oral - - -  05/09/11 2309 157/90 mmHg - - 96  20  98 %  05/09/11 1950 152/88 mmHg 97.5 F (36.4 C) Oral 97  22  95 %  05/09/11 1600 139/86 mmHg 98.4 F (36.9 C) Oral 94  20  96 %  05/09/11 1400 - - - 105  13  96 %  05/09/11 1300 - - - 92  22  95 %  05/09/11 1205 149/77 mmHg 98.2 F (36.8 C) Oral 87  21  95 %  05/09/11 1100 - - - 90  19  97 %  05/09/11 1005 121/65 mmHg - - 97  - -  05/09/11 1000 - - - 76  20  95 %  05/09/11 0953 121/65 mmHg - - 72  19  96 %  05/09/11 0900 - - - 101  21  96 %  05/09/11 0800 - - - 106  23  93 %  05/09/11 0742 145/75 mmHg 97.8 F (36.6 C) Oral 96  21  96 %  05/09/11 0700 - - - 108  19  93 %    Physical Exam: General:  No acute distress, sleeping restfully Abdomen:               []  Soft, appropriately TTP  [x]  Soft, NTTP  []  Soft, appropriately TTP, incision(s) clean/dry/intact  Genitourinary: Foley in place Foley:  Draining clear yellow urine in tubing    I/O last 3 completed shifts: In: 3668 [P.O.:1340; I.V.:478; Other:1850] Out: 2125 [Urine:2125]  Recent Labs  Basename 05/09/11 0555 05/08/11 0552   HGB 13.1 13.0   WBC 9.0 10.8*   PLT 242 286    Recent Labs  Basename 05/09/11 0555 05/08/11 0552   NA 143 141   K 2.8* 4.4   CL 98 98   CO2 30 23   BUN 63* 86*   CREATININE 2.57* 5.40*   CALCIUM 7.5* 7.9*   GFRNONAA 16* 7*   GFRAA 19* 8*     No results found for this basename: PT:2,INR:2,APTT:2 in the last 72 hours   No components found with this basename: ABG:2    Length of stay: 3 days.  Assessment: Left ureter stone. POD#3 left ureter stent placement. Solitary left kidney.  Plan: Recheck renal function today. Once renal function normalizes it  is OK from the GU point of view to attempt voiding trial, but primary team may want catheter for now to keep an eye on fluid status.  Natalia Leatherwood, MD [phone]

## 2011-05-10 NOTE — Progress Notes (Signed)
Pt was sitting up in chair--accordding to son--she wanted to go back to bed--he told his mom to let him go to the bathroom first--as he was in the restroom--heard his mother moving--observed her "squatting" to the floor-- pt asst back to bed--denies any injury-- no injuries noted--VSS- MD to be paged--call bell in place--safety sitter requested--pt appears to be sl more disoriented about her situation since the sun has gone down some.

## 2011-05-10 NOTE — Progress Notes (Addendum)
[facility]  Subjective: Again had a restless night despite receiving bedtime sleep aid (equivelent) last night. She eventually fell asleep at 4 am and awoke later much less confused. She was aided to a chair by the nurse later today but decided to get back on her own and fell. Per RN, no injury noted.    Objective: Blood pressure 157/84, pulse 89, temperature 98.3 F (36.8 C), temperature source Oral, resp. rate 34, height 5\' 4"  (1.626 m), weight 72 kg (158 lb 11.7 oz), SpO2 97.00%. Weight change:   Intake/Output Summary (Last 24 hours) at 05/10/11 1645 Last data filed at 05/10/11 1600  Gross per 24 hour  Intake   1055 ml  Output    750 ml  Net    305 ml    Physical Exam: General appearance: alert and cooperative Lungs: CTA b/l Heart: regular rate and rhythm, S1, S2 normal, no murmur, click, rub or gallop Abdomen: soft, non-tender; bowel sounds normal; no masses,  no organomegaly Extremities: extremities normal, atraumatic, no cyanosis or edema  Lab Results:  Basename 05/10/11 1303 05/10/11 0500  NA 139 142  K 4.1 3.7  CL 99 102  CO2 27 28  GLUCOSE 308* 122*  BUN 37* 40*  CREATININE 1.43* 1.48*  CALCIUM 8.0* 7.8*  MG -- --  PHOS -- --   No results found for this basename: AST:2,ALT:2,ALKPHOS:2,BILITOT:2,PROT:2,ALBUMIN:2 in the last 72 hours No results found for this basename: LIPASE:2,AMYLASE:2 in the last 72 hours  Basename 05/09/11 0555 05/08/11 0552  WBC 9.0 10.8*  NEUTROABS -- --  HGB 13.1 13.0  HCT 38.9 37.2  MCV 90.0 89.2  PLT 242 286    Basename 05/07/11 2224  CKTOTAL --  CKMB --  CKMBINDEX --  TROPONINI <0.30   No components found with this basename: POCBNP:3 No results found for this basename: DDIMER:2 in the last 72 hours No results found for this basename: HGBA1C:2 in the last 72 hours No results found for this basename: CHOL:2,HDL:2,LDLCALC:2,TRIG:2,CHOLHDL:2,LDLDIRECT:2 in the last 72 hours No results found for this basename:  TSH,T4TOTAL,FREET3,T3FREE,THYROIDAB in the last 72 hours No results found for this basename: VITAMINB12:2,FOLATE:2,FERRITIN:2,TIBC:2,IRON:2,RETICCTPCT:2 in the last 72 hours  Micro Results: Recent Results (from the past 240 hour(s))  URINE CULTURE     Status: Normal   Collection Time   05/07/11 12:04 PM      Component Value Range Status Comment   Specimen Description URINE, CATHETERIZED   Final    Special Requests NONE   Final    Culture  Setup Time [id number]   Final    Colony Count NO GROWTH   Final    Culture NO GROWTH   Final    Report Status 05/08/2011 FINAL   Final   MRSA PCR SCREENING     Status: Normal   Collection Time   05/07/11  8:43 PM      Component Value Range Status Comment   MRSA by PCR NEGATIVE  NEGATIVE  Final     Studies/Results: Ct Abdomen Pelvis Wo Contrast  05/07/2011  *RADIOLOGY REPORT*  Clinical Data: Acute renal failure.  Abdominal pain  CT ABDOMEN AND PELVIS WITHOUT CONTRAST  Technique:  Multidetector CT imaging of the abdomen and pelvis was performed following the standard protocol without intravenous contrast.  Comparison: CT 10/26/2008  Findings: Obstruction of the left kidney with hydronephrosis and perinephric stranding.  7 mm stone in the proximal left ureter causing high grade obstruction of the left kidney.  Right kidney has been removed.  Lung bases are clear.  15 mm hypodensity in the left lobe of the liver is unchanged from the  prior study and is compatible with a cyst.  No new liver lesions are seen.  Gallstones are present layering in the gallbladder.  Bile ducts are not dilated.  Pancreas and spleen are normal.  Negative for bowel obstruction.  Sigmoid diverticulosis is present. Pessary is present in the vagina.  Chronic fracture of L1. Degenerative changes in the lumbar spine. Atherosclerotic aorta without aneurysm.  IMPRESSION: High-grade obstruction of the left kidney due to a 7 mm stone in the proximal left ureter.  Right kidney is surgically  absent.  Original Report Authenticated By: Camelia Phenes, M.D.   Dg Chest 2 View  05/07/2011  *RADIOLOGY REPORT*  Clinical Data: Confusion, shortness of breath, history hypertension, diabetes, coronary artery disease, hypertrophic cardiomyopathy  CHEST - 2 VIEW  Comparison: 12/29/2008  Findings: Left subclavian sequential transvenous pacemaker leads project at right atrium and right ventricle. Normal heart size post CABG. Minimally prominent left superior mediastinal soft tissues stable, question related to tortuous innominate artery. Atherosclerotic calcification aorta. Pulmonary vascularity normal. Chronic elevation right diaphragm. Minimal bibasilar atelectasis. Aeration at left base improved since previous exam. Upper lungs clear. Bones diffusely demineralized.  IMPRESSION: Post CABG and pacemaker. Minimal bibasilar atelectasis and chronic elevation right diaphragm.  Original Report Authenticated By: Lollie Marrow, M.D.   Dg Pelvis 1-2 Views  05/07/2011  *RADIOLOGY REPORT*  Clinical Data: Hip discomfort post recent fall  PELVIS - 1-2 VIEW  Comparison: None  Findings: Osseous demineralization. Hip and SI joint spaces preserved. No acute fracture, dislocation, or bone destruction. Minimal increased attenuation of the femoral heads bilaterally likely degenerative in origin, corresponding to minimal subchondral sclerosis identified at the posterior aspects of the femoral heads on a prior CT. Scattered pelvic phleboliths. Degenerative disc and facet disease changes lower lumbar spine.  IMPRESSION: Osseous demineralization with minimal degenerative changes of the hip joints bilaterally. No definite acute osseous abnormalities. If patient has persistent symptoms or an inability to bear weight, consider MR imaging of the hip without contrast to exclude occult fracture.  Original Report Authenticated By: Lollie Marrow, M.D.  Ct Head Wo Contrast  05/07/2011  *RADIOLOGY REPORT*  Clinical Data: Hypoglycemia.   Unresponsive.  Improved with administration of glucose.  CT HEAD WITHOUT CONTRAST  Technique:  Contiguous axial images were obtained from the base of the skull through the vertex without contrast.  Comparison: 12/31/2008.  Findings: No intracranial hemorrhage.  Interval development of hypodensity within the right paracentral pontine region.  This may represent a small acute infarct.  Result of artifact not excluded.  Small vessel disease type changes.  Global atrophy without hydrocephalus.  Vascular calcifications.  No intracranial mass lesion detected on this unenhanced exam.  Opacification in the left sphenoid sinus.  IMPRESSION: Interval development of hypodensity within the right paracentral pontine region.  This may represent a small acute infarct.  Result of artifact not excluded.  Opacification left sphenoid sinus.  Original Report Authenticated By: Fuller Canada, M.D.   Dg Cystogram  05/07/2011  *RADIOLOGY REPORT*  Clinical Data: Ureteral obstruction.  Ureteral stent placement.  Comparison:  CT scan, same date.  Findings: Single fluoroscopic spot image demonstrates a left-sided ureteral stent with the tip in the region of the renal pelvis.  IMPRESSION: Left ureteral stent placement.  Original Report Authenticated By: P. Loralie Champagne, M.D.    Medications: Scheduled Meds:    . acidophilus  1  capsule Oral Daily  . amitriptyline  12.5 mg Oral QHS  . cefTRIAXone (ROCEPHIN)  IV  1 g Intravenous Q24H  . hydrocortisone sod succinate (SOLU-CORTEF) injection  50 mg Intravenous Daily  . insulin aspart  0-15 Units Subcutaneous TID WC  . insulin aspart  0-5 Units Subcutaneous QHS  . latanoprost  1 drop Left Eye QHS  . metoprolol  25 mg Oral BID  . sodium chloride  3 mL Intravenous Q12H  . DISCONTD: amitriptyline-chlordiazePOXIDE  1 tablet Oral QHS   Continuous Infusions:    . sodium chloride Stopped (05/09/11 1845)  . dextrose     PRN Meds:.acetaminophen, acetaminophen, albuterol,  chlordiazePOXIDE, dextrose, hydrALAZINE, morphine, ondansetron (ZOFRAN) IV, ondansetron  Assessment/Plan: Principal Problem:  *Acute renal failure (ARF)/ ATN Due to Nephrolithiasis s/p Left J stent being followed by Urology Cr improving. Per Urology we can give a voiding trial when creatinine returns to baseline.    Hyperkalemia/ hypokalmeia   Improving as Cr improves.  K dropped after kayexalate but now replaced and stable.    Metabolic acidosis Resolved with improvement in renal function  Dementia with altered mental status during this hospital stay (delirium)- PRN Haldol.    DM type 2 (diabetes mellitus, type 2) Sugars increased as appetite increases. Also due to solu-cortef.   holding PO hypoglycemics due to ARF Increased sliding scale to moderate- Will be stopping Solu- cortef today. Will start a small dose of Novolog with meals.   Pulmonary edema and mild hypoxia Doing better after 2 doses of Lasix given on 3/28. Holding off on IVF now as she is drinking well.  ECHO: Reveals severe HOCM - continuing Lopressor She is already under the care of [facility] for this.    Pacemaker for complete heart block   CAD (coronary artery disease) Plavix and ASA on hold for possible urological procedure   HTN (hypertension), benign  Lopressor resumed     LOS: 3 days   [facility] [phone] 05/10/2011, 4:45 PM

## 2011-05-11 LAB — BASIC METABOLIC PANEL
BUN: 26 mg/dL — ABNORMAL HIGH (ref 6–23)
Chloride: 105 mEq/L (ref 96–112)
GFR calc Af Amer: 52 mL/min — ABNORMAL LOW (ref >90)
Potassium: 3.3 mEq/L — ABNORMAL LOW (ref 3.5–5.1)
Sodium: 144 mEq/L (ref 135–145)

## 2011-05-11 LAB — GLUCOSE, CAPILLARY
Glucose-Capillary: 111 mg/dL — ABNORMAL HIGH (ref 70–99)
Glucose-Capillary: 190 mg/dL — ABNORMAL HIGH (ref 70–99)

## 2011-05-11 NOTE — Progress Notes (Signed)
Pt has not void since d/c of foley earlier today--bladder scan reveal 313cc of urine--Dr Ralzin informed--state to repeat scan in a couple of hours. Rn will notify RN on 5500.

## 2011-05-11 NOTE — Progress Notes (Addendum)
[facility]  Subjective: She slept well last night and ate well this AM. She is not in any distress. Foley was just removed and we are waiting for her to void.   Objective: Blood pressure 167/87, pulse 98, temperature 98.6 F (37 C), temperature source Oral, resp. rate 20, height 5\' 4"  (1.626 m), weight 72 kg (158 lb 11.7 oz), SpO2 96.00%. Weight change:   Intake/Output Summary (Last 24 hours) at 05/11/11 1158 Last data filed at 05/11/11 1041  Gross per 24 hour  Intake    855 ml  Output   1900 ml  Net  -1045 ml    Physical Exam: General appearance: alert and cooperative Lungs: CTA b/l Heart: regular rate and rhythm, S1, S2 normal, no murmur, click, rub or gallop Abdomen: soft, non-tender; bowel sounds normal; no masses,  no organomegaly Extremities: extremities normal, atraumatic, no cyanosis or edema  Lab Results:  Basename 05/11/11 0545 05/10/11 1303  NA 144 139  K 3.3* 4.1  CL 105 99  CO2 28 27  GLUCOSE 92 308*  BUN 26* 37*  CREATININE 1.09 1.43*  CALCIUM 8.2* 8.0*  MG -- --  PHOS -- --   No results found for this basename: AST:2,ALT:2,ALKPHOS:2,BILITOT:2,PROT:2,ALBUMIN:2 in the last 72 hours No results found for this basename: LIPASE:2,AMYLASE:2 in the last 72 hours  Basename 05/09/11 0555  WBC 9.0  NEUTROABS --  HGB 13.1  HCT 38.9  MCV 90.0  PLT 242   No results found for this basename: CKTOTAL:3,CKMB:3,CKMBINDEX:3,TROPONINI:3 in the last 72 hours No components found with this basename: POCBNP:3 No results found for this basename: DDIMER:2 in the last 72 hours No results found for this basename: HGBA1C:2 in the last 72 hours No results found for this basename: CHOL:2,HDL:2,LDLCALC:2,TRIG:2,CHOLHDL:2,LDLDIRECT:2 in the last 72 hours No results found for this basename: TSH,T4TOTAL,FREET3,T3FREE,THYROIDAB in the last 72 hours No results found for this basename: VITAMINB12:2,FOLATE:2,FERRITIN:2,TIBC:2,IRON:2,RETICCTPCT:2 in the last 72 hours  Micro  Results: Recent Results (from the past 240 hour(s))  URINE CULTURE     Status: Normal   Collection Time   05/07/11 12:04 PM      Component Value Range Status Comment   Specimen Description URINE, CATHETERIZED   Final    Special Requests NONE   Final    Culture  Setup Time [id number]   Final    Colony Count NO GROWTH   Final    Culture NO GROWTH   Final    Report Status 05/08/2011 FINAL   Final   MRSA PCR SCREENING     Status: Normal   Collection Time   05/07/11  8:43 PM      Component Value Range Status Comment   MRSA by PCR NEGATIVE  NEGATIVE  Final     Medications: Scheduled Meds:    . acidophilus  1 capsule Oral Daily  . amitriptyline  12.5 mg Oral QHS  . cefTRIAXone (ROCEPHIN)  IV  1 g Intravenous Q24H  . insulin aspart  0-15 Units Subcutaneous TID WC  . insulin aspart  0-5 Units Subcutaneous QHS  . insulin aspart  3 Units Subcutaneous TID WC  . latanoprost  1 drop Left Eye QHS  . metoprolol  25 mg Oral BID  . sodium chloride  3 mL Intravenous Q12H  . DISCONTD: hydrocortisone sod succinate (SOLU-CORTEF) injection  50 mg Intravenous Daily   Continuous Infusions:    . DISCONTD: sodium chloride Stopped (05/09/11 1845)  . DISCONTD: dextrose     PRN Meds:.acetaminophen, acetaminophen, albuterol, chlordiazePOXIDE, dextrose,  hydrALAZINE, morphine, ondansetron (ZOFRAN) IV, ondansetron  Assessment/Plan: Principal Problem:  *Acute renal failure (ARF)/ ATN Due to Nephrolithiasis s/p Left J stent being followed by Urology Cr improved back to normal. Per Urology recommendations, we are giving her a voiding trial today.  Plavix and ASA on hold for possible procedure this week.  Will need to ask Dr Patsi Sears tomorrow when he plans a procedure   Hyperkalemia/ hypokalmeia   Improving as Cr improves.  K dropped after kayexalate but now replaced and stable.    Metabolic acidosis Resolved with improvement in renal function  Dementia with altered mental status during this  hospital stay (delirium)- much better.     DM type 2 (diabetes mellitus, type 2) Sugars increased as appetite increases. Also due to solu-cortef.   holding PO hypoglycemics due to ARF Increased sliding scale to moderate- Will be stopping Solu- cortef today. Will start a small dose of Novolog with meals.   Pulmonary edema and mild hypoxia Doing better after 2 doses of Lasix given on 3/28. Holding off on IVF now as she is drinking well.  ECHO: Reveals severe HOCM - continuing Lopressor She is already under the care of [facility] for this.    Pacemaker for complete heart block   CAD (coronary artery disease) Plavix and ASA on hold for possible urological procedure   HTN (hypertension), benign  Lopressor resumed  Disposition Transfer out of SDU. PT has been ordered.    LOS: 4 days   [facility] [phone] 05/11/2011, 11:58 AM

## 2011-05-11 NOTE — Progress Notes (Signed)
Report called to RN on 5500--waiting for a stat clean on a camera room for better pt safety. Pt and family updated.

## 2011-05-11 NOTE — Progress Notes (Signed)
4 Days Post-Op Subjective: Patient reports no urologic complaints specifically no flank pain or irritative voiding symptoms.  Objective: Vital signs in last 24 hours: Temp:  [98.2 F (36.8 C)-99.3 F (37.4 C)] 99.1 F (37.3 C) (03/31 0805) Pulse Rate:  [77-96] 96  (03/31 0500) Resp:  [14-23] 19  (03/31 0500) BP: (100-157)/(44-89) 157/89 mmHg (03/31 0322) SpO2:  [90 %-99 %] 93 % (03/31 0500) Weight:  [72 kg (158 lb 11.7 oz)] 72 kg (158 lb 11.7 oz) (03/31 0500)  Intake/Output from previous day: 03/30 0701 - 03/31 0700 In: 780 [P.O.:780] Out: 1100 [Urine:1100] Intake/Output this shift:    Physical Exam:  She is awake and alert. Abdomen is soft and nontender. No flank tenderness on palpation. No suprapubic mass or tenderness.  Lab Results:  [facility] 05/09/11 0555  HGB 13.1  HCT 38.9   BMET  Basename 05/11/11 0545 05/10/11 1303  NA 144 139  K 3.3* 4.1  CL 105 99  CO2 28 27  GLUCOSE 92 308*  BUN 26* 37*  CREATININE 1.09 1.43*  CALCIUM 8.2* 8.0*   No results found for this basename: LABPT:3,INR:3 in the last 72 hours No results found for this basename: LABURIN:1 in the last 72 hours Results for orders placed during the hospital encounter of 05/07/11  URINE CULTURE     Status: Normal   Collection Time   05/07/11 12:04 PM      Component Value Range Status Comment   Specimen Description URINE, CATHETERIZED   Final    Special Requests NONE   Final    Culture  Setup Time [id number]   Final    Colony Count NO GROWTH   Final    Culture NO GROWTH   Final    Report Status 05/08/2011 FINAL   Final   MRSA PCR SCREENING     Status: Normal   Collection Time   05/07/11  8:43 PM      Component Value Range Status Comment   MRSA by PCR NEGATIVE  NEGATIVE  Final     Studies/Results: No results found.  Assessment/Plan: She appears to be tolerating her stent well. Her creatinine has now returned to completely normal and she has a negative urine culture from last  week.  She will followup with Dr. Patsi Sears as an outpatient for definitive treatment of her ureteral stone.  Her stent will remain indwelling upon discharge.   LOS: 4 days   Gia Lusher C 05/11/2011, 8:09 AM

## 2011-05-12 LAB — GLUCOSE, CAPILLARY
Glucose-Capillary: 159 mg/dL — ABNORMAL HIGH (ref 70–99)
Glucose-Capillary: 115 mg/dL — ABNORMAL HIGH (ref 70–99)
Glucose-Capillary: 219 mg/dL — ABNORMAL HIGH (ref 70–99)

## 2011-05-12 MED ORDER — NIACIN 500 MG PO TABS
500.0000 mg | ORAL_TABLET | Freq: Two times a day (BID) | ORAL | Status: DC
  Administered 2011-05-12 – 2011-05-13 (×3): 500 mg via ORAL
  Filled 2011-05-12 (×4): qty 1

## 2011-05-12 MED ORDER — PREDNISONE 10 MG PO TABS
10.0000 mg | ORAL_TABLET | Freq: Every day | ORAL | Status: DC
  Administered 2011-05-12 – 2011-05-13 (×2): 10 mg via ORAL
  Filled 2011-05-12 (×3): qty 1

## 2011-05-12 MED ORDER — ADULT MULTIVITAMIN W/MINERALS CH
1.0000 | ORAL_TABLET | Freq: Every day | ORAL | Status: DC
  Administered 2011-05-12 – 2011-05-13 (×2): 1 via ORAL
  Filled 2011-05-12 (×2): qty 1

## 2011-05-12 MED ORDER — ASPIRIN EC 81 MG PO TBEC
81.0000 mg | DELAYED_RELEASE_TABLET | Freq: Every day | ORAL | Status: DC
  Administered 2011-05-12 – 2011-05-13 (×2): 81 mg via ORAL
  Filled 2011-05-12 (×2): qty 1

## 2011-05-12 MED ORDER — SIMVASTATIN 10 MG PO TABS
10.0000 mg | ORAL_TABLET | Freq: Every day | ORAL | Status: DC
  Administered 2011-05-12: 10 mg via ORAL
  Filled 2011-05-12 (×2): qty 1

## 2011-05-12 NOTE — Progress Notes (Signed)
[facility]  Subjective: Did void-in her bed.Awake and alert. Son at bedside.  Objective: Blood pressure 146/87, pulse 103, temperature 98.5 F (36.9 C), temperature source Oral, resp. rate 20, height 5\' 4"  (1.626 m), weight 72.8 kg (160 lb 7.9 oz), SpO2 97.00%. Weight change: 0.8 kg (1 lb 12.2 oz)  Intake/Output Summary (Last 24 hours) at 05/12/11 1220 Last data filed at 05/12/11 1100  Gross per 24 hour  Intake    480 ml  Output    650 ml  Net   -170 ml    Physical Exam: General appearance: alert and cooperative Lungs: CTA b/l Heart: regular rate and rhythm, S1, S2 normal, no murmur, click, rub or gallop Abdomen: soft, non-tender; bowel sounds normal; no masses,  no organomegaly Extremities: extremities normal, atraumatic, no cyanosis or edema  Lab Results:  Basename 05/11/11 0545 05/10/11 1303  NA 144 139  K 3.3* 4.1  CL 105 99  CO2 28 27  GLUCOSE 92 308*  BUN 26* 37*  CREATININE 1.09 1.43*  CALCIUM 8.2* 8.0*  MG -- --  PHOS -- --   No results found for this basename: AST:2,ALT:2,ALKPHOS:2,BILITOT:2,PROT:2,ALBUMIN:2 in the last 72 hours No results found for this basename: LIPASE:2,AMYLASE:2 in the last 72 hours No results found for this basename: WBC:2,NEUTROABS:2,HGB:2,HCT:2,MCV:2,PLT:2 in the last 72 hours No results found for this basename: CKTOTAL:3,CKMB:3,CKMBINDEX:3,TROPONINI:3 in the last 72 hours No components found with this basename: POCBNP:3 No results found for this basename: DDIMER:2 in the last 72 hours No results found for this basename: HGBA1C:2 in the last 72 hours No results found for this basename: CHOL:2,HDL:2,LDLCALC:2,TRIG:2,CHOLHDL:2,LDLDIRECT:2 in the last 72 hours No results found for this basename: TSH,T4TOTAL,FREET3,T3FREE,THYROIDAB in the last 72 hours No results found for this basename: VITAMINB12:2,FOLATE:2,FERRITIN:2,TIBC:2,IRON:2,RETICCTPCT:2 in the last 72 hours  Micro Results: Recent Results (from the past 240 hour(s))    URINE CULTURE     Status: Normal   Collection Time   05/07/11 12:04 PM      Component Value Range Status Comment   Specimen Description URINE, CATHETERIZED   Final    Special Requests NONE   Final    Culture  Setup Time [id number]   Final    Colony Count NO GROWTH   Final    Culture NO GROWTH   Final    Report Status 05/08/2011 FINAL   Final   MRSA PCR SCREENING     Status: Normal   Collection Time   05/07/11  8:43 PM      Component Value Range Status Comment   MRSA by PCR NEGATIVE  NEGATIVE  Final     Medications: Scheduled Meds:    . acidophilus  1 capsule Oral Daily  . amitriptyline  12.5 mg Oral QHS  . cefTRIAXone (ROCEPHIN)  IV  1 g Intravenous Q24H  . insulin aspart  0-15 Units Subcutaneous TID WC  . insulin aspart  0-5 Units Subcutaneous QHS  . insulin aspart  3 Units Subcutaneous TID WC  . latanoprost  1 drop Left Eye QHS  . metoprolol  25 mg Oral BID  . sodium chloride  3 mL Intravenous Q12H   Continuous Infusions:   PRN Meds:.acetaminophen, acetaminophen, albuterol, chlordiazePOXIDE, dextrose, hydrALAZINE, morphine, ondansetron (ZOFRAN) IV, ondansetron  Assessment/Plan: Principal Problem:  *Acute renal failure (ARF)/ ATN Due to Nephrolithiasis s/p Left J stent being followed by Urology Cr improved back to normal.  Per Urology last note-no further treatment of her ureteral stone to be done as a outpatient..  Resume Plavix as  no further procedures planned this admit   Hyperkalemia/ hypokalmeia   Improving as Cr improves.  K dropped after kayexalate but now replaced and stable-recheck in am  Metabolic acidosis Resolved with improvement in renal function  Dementia with altered mental status during this hospital stay (delirium)- much better-and now at baseline   DM type 2 (diabetes mellitus, type 2) Sugars stable  Increased sliding scale to moderate- Will be stopping Solu- cortef today. Will start a small dose of Novolog with meals.  -resume oral  hypoglycemics on discharge  Pulmonary edema and mild hypoxia -clinically compensated Doing better after 2 doses of Lasix given on 3/28.  ECHO:Reveals severe HOCM - continuing Lopressor-avoid preload reducing agents She is already under the care of [facility] for this.    Pacemaker for complete heart block   CAD (coronary artery disease) Resume antiplatelet agents   HTN (hypertension), benign  Lopressor resumed-controlled  Dyslipidemia -resume statins  RA -previously on chronic steroids, resume  Disposition HHPT vs SNF-await PT eval.    LOS: 5 days   [facility]  05/12/2011, 12:20 PM

## 2011-05-12 NOTE — Progress Notes (Signed)
Patient ambulated to bathroom with one assist. Steady on her feet. [name]

## 2011-05-12 NOTE — Evaluation (Addendum)
Physical Therapy Evaluation and Discharge Patient Details Name: [name] MRN: [id number] DOB: [birth year] Today's Date: 05/12/2011  Problem List:  Patient Active Problem List  Diagnoses  . Pacemaker  . CAD (coronary artery disease)  . CHB (complete heart block)  . HTN (hypertension), benign  . DM (diabetes mellitus)  . Hypertrophic cardiomyopathy  . Hyperlipidemia  . Acute renal failure (ARF)  . Ureterolithiasis  . Hydronephrosis  . Hyperkalemia  . Hypoglycemia  . Metabolic acidemia  . DM type 2 (diabetes mellitus, type 2)    Past Medical History:  Past Medical History  Diagnosis Date  . Pacemaker   . CAD (coronary artery disease)   . CHB (complete heart block)   . HTN (hypertension), benign   . DM (diabetes mellitus)   . Hypertrophic cardiomyopathy   . Hyperlipidemia   . OP (osteoporosis)   . Hx: UTI (urinary tract infection)   . Fatigue   . Rheumatoid arthritis   . Chronic steroid use   . Confusion   . Pancreatic pseudocyst   . Diverticulosis    Past Surgical History:  Past Surgical History  Procedure Date  . Insert / replace / remove pacemaker   . Cardiac catheterization 04/17/2005    EF 65%  . Septal myectomy   . Nephrectomy     RIGHT  . Arm fracture surgery     RIGHT ARM  . Coronary artery bypass graft 04/2005    LIMA GRAFT TO THE DISTAL LAD, SAPHENOUS VEIN GRAFT TO THE DIAGONAL, SAPHENOUS VEIN GRAFT TO THE INTERMEDIATE BRANCH, AND SAPHENOUS VEIN GRAFT TO THE DISTAL RIGHT CORONARY  . Myomectomy   . Cataract extraction, bilateral   . Cystoscopy 05/07/2011    Procedure: CYSTOSCOPY;  Surgeon: Crecencio Mc, MD;  Location: [facility] OR;  Service: Urology;  Laterality: Left;    PT Assessment/Plan/Recommendation PT Assessment Clinical Impression Statement: Pt is an 78 y/o female admitted for ARF.  Pt demonstrates independence to modified independence with all mobility.  No acute PT needs identifiied.  No follow-up Pt needs.  Acute PT signing off.  PT  Recommendation/Assessment: Patent does not need any further PT services No Skilled PT: Patient at baseline level of functioning PT Recommendation Follow Up Recommendations: No PT follow up Equipment Recommended: None recommended by PT PT Goals  Acute Rehab PT Goals PT Goal Formulation: With patient/family  PT Evaluation Precautions/Restrictions  Precautions Precautions: Fall Restrictions Weight Bearing Restrictions: No Prior Functioning  Home Living Lives With: Sheran Spine Help From: Family Type of Home: House Home Layout: One level Home Access: Level entry Bathroom Shower/Tub: Tub/shower unit;Curtain Firefighter: Standard Bathroom Accessibility: Yes How Accessible: Accessible via wheelchair;Accessible via walker Home Adaptive Equipment: Bedside commode/3-in-1;Built-in shower seat;Walker - rolling;Raised toilet seat with rails Prior Function Level of Independence: Independent with basic ADLs;Independent with gait;Independent with transfers;Independent with homemaking with ambulation Able to Take Stairs?: No Driving: No Vocation: Retired Leisure: Hobbies-no Cognition Cognition Arousal/Alertness: Lethargic Overall Cognitive Status: Appears within functional limits for tasks assessed Orientation Level: Oriented to person;Oriented to time;Oriented to situation;Oriented to place Sensation/Coordination Sensation Light Touch: Appears Intact Stereognosis: Not tested Hot/Cold: Not tested Proprioception: Not tested Coordination Gross Motor Movements are Fluid and Coordinated: Yes Fine Motor Movements are Fluid and Coordinated: Yes Extremity Assessment RUE Assessment RUE Assessment: Within Functional Limits LUE Assessment LUE Assessment: Within Functional Limits RLE Assessment RLE Assessment: Within Functional Limits LLE Assessment LLE Assessment: Within Functional Limits Mobility (including Balance) Bed Mobility Bed Mobility: Yes Supine to Sit: 7: Independent;HOB  flat Sit to Supine: 7: Independent;HOB flat Transfers Transfers: Yes Sit to Stand: 5: Supervision;From bed Sit to Stand Details (indicate cue type and reason): for safety, verbal cues for hand placement Stand to Sit: 7: Independent;With upper extremity assist;To bed Ambulation/Gait Ambulation/Gait: Yes Ambulation/Gait Assistance: 6: Modified independent (Device/Increase time) Ambulation Distance (Feet): 150 Feet Assistive device: Rolling walker Gait Pattern: Within Functional Limits Stairs: No Wheelchair Mobility Wheelchair Mobility: No  Posture/Postural Control Posture/Postural Control: No significant limitations Balance Balance Assessed: Yes Static Sitting Balance Static Sitting - Balance Support: Feet supported;No upper extremity supported Static Sitting - Level of Assistance: 7: Independent Static Sitting - Comment/# of Minutes: 2+ min on EOB with no lob Exercise    End of Session PT - End of Session Equipment Utilized During Treatment: Gait belt Activity Tolerance: Patient tolerated treatment well Patient left: in bed;with call bell in reach;with family/visitor present;with bed alarm set Nurse Communication: Mobility status for transfers;Mobility status for ambulation General Behavior During Session: [facility] for tasks performed Cognition: [facility] for tasks performed  Kimie Pidcock 05/12/2011, 5:46 PM Fedrick Cefalu L. Tobyn Osgood DPT [phone]

## 2011-05-13 LAB — GLUCOSE, CAPILLARY: Glucose-Capillary: 136 mg/dL — ABNORMAL HIGH (ref 70–99)

## 2011-05-13 NOTE — Discharge Summary (Signed)
PATIENT DETAILS Name: [name] Age: 78 y.o. Sex: female Date of Birth: 10-23-1924 MRN: [id number]. Admit Date: 05/07/2011 Admitting Physician: Osvaldo Shipper, MD [id number] Joelene Millin, MD, MD  PRIMARY DISCHARGE DIAGNOSIS:  Principal Problem:  *Acute renal failure (ARF) Active Problems:  Pacemaker  CAD (coronary artery disease)  HTN (hypertension), benign  Ureterolithiasis  Hydronephrosis  Hyperkalemia  Hypoglycemia  Metabolic acidemia  DM type 2 (diabetes mellitus, type 2)      PAST MEDICAL HISTORY: Past Medical History  Diagnosis Date  . Pacemaker   . CAD (coronary artery disease)   . CHB (complete heart block)   . HTN (hypertension), benign   . DM (diabetes mellitus)   . Hypertrophic cardiomyopathy   . Hyperlipidemia   . OP (osteoporosis)   . Hx: UTI (urinary tract infection)   . Fatigue   . Rheumatoid arthritis   . Chronic steroid use   . Confusion   . Pancreatic pseudocyst   . Diverticulosis     DISCHARGE MEDICATIONS: Medication List  As of 05/13/2011 10:04 AM   TAKE these medications         ACIDOPHILUS PO   Take 460 mg by mouth daily.      amitriptyline-chlordiazePOXIDE 12.5-5 MG per tablet   Commonly known as: LIMBITROL   Take 1 tablet by mouth at bedtime.      aspirin EC 81 MG tablet   Take 81 mg by mouth daily.      CALCIUM 500 PO   Take 1 tablet by mouth daily.      clopidogrel 75 MG tablet   Commonly known as: PLAVIX   Take 75 mg by mouth daily.      estradiol 2 MG vaginal ring   Commonly known as: ESTRING   Place 2 mg vaginally every 3 (three) months. follow package directions      glimepiride 4 MG tablet   Commonly known as: AMARYL   Take 2 mg by mouth daily before breakfast.      HYDROcodone-acetaminophen 5-500 MG per tablet   Commonly known as: VICODIN   Take 1 tablet by mouth every 6 (six) hours as needed. For pain.      ICAPS PO   Take 1 capsule by mouth daily.      latanoprost 0.005 % ophthalmic solution   Commonly  known as: XALATAN   Place 1 drop into the left eye daily.      lovastatin 20 MG tablet   Commonly known as: MEVACOR   Take 20 mg by mouth every evening.      metFORMIN 1000 MG tablet   Commonly known as: GLUCOPHAGE   Take 1,000 mg by mouth 2 (two) times daily.      metoprolol 50 MG tablet   Commonly known as: LOPRESSOR   Take 25 mg by mouth 2 (two) times daily.      mulitivitamin with minerals Tabs   Take 1 tablet by mouth daily.      niacin 500 MG tablet   Take 500 mg by mouth 2 (two) times daily.      nitroGLYCERIN 0.4 MG SL tablet   Commonly known as: NITROSTAT   Place 0.4 mg under the tongue every 5 (five) minutes as needed. For chest pain.      predniSONE 5 MG tablet   Commonly known as: DELTASONE   Take 5 mg by mouth daily.      raloxifene 60 MG tablet   Commonly known as: EVISTA   Take 60  mg by mouth daily.      ranitidine 300 MG tablet   Commonly known as: ZANTAC   Take 150 mg by mouth 2 (two) times daily.      SYSTANE OP   Apply 1-2 drops to eye as needed. For eye irritation      trimethoprim 100 MG tablet   Commonly known as: TRIMPEX   Take 100 mg by mouth every evening.      Vitamin D (Ergocalciferol) 50000 UNITS Caps   Commonly known as: DRISDOL   Take 50,000 Units by mouth every 14 (fourteen) days.             BRIEF HPI:  See H&P, Labs, Consult and Test reports for all details in brief,yo female with hx HTN, DM, RA on chronic steroids. Presented to ER 3/27 after being found unresponsive by family. Hypoglycemic on EMS arrival with CBG of 25. Received d50 with improved mentation and cbg and was brought to ER. Further w/u revealed acute renal failure with SCr 8.02   CONSULTATIONS:   pulmonary/intensive care and urology  PERTINENT RADIOLOGIC STUDIES: Ct Abdomen Pelvis Wo Contrast  05/07/2011  *RADIOLOGY REPORT*  Clinical Data: Acute renal failure.  Abdominal pain  CT ABDOMEN AND PELVIS WITHOUT CONTRAST  Technique:  Multidetector CT imaging of the  abdomen and pelvis was performed following the standard protocol without intravenous contrast.  Comparison: CT 10/26/2008  Findings: Obstruction of the left kidney with hydronephrosis and perinephric stranding.  7 mm stone in the proximal left ureter causing high grade obstruction of the left kidney.  Right kidney has been removed.  Lung bases are clear.  15 mm hypodensity in the left lobe of the liver is unchanged from the  prior study and is compatible with a cyst.  No new liver lesions are seen.  Gallstones are present layering in the gallbladder.  Bile ducts are not dilated.  Pancreas and spleen are normal.  Negative for bowel obstruction.  Sigmoid diverticulosis is present. Pessary is present in the vagina.  Chronic fracture of L1. Degenerative changes in the lumbar spine. Atherosclerotic aorta without aneurysm.  IMPRESSION: High-grade obstruction of the left kidney due to a 7 mm stone in the proximal left ureter.  Right kidney is surgically absent.  Original Report Authenticated By: Camelia Phenes, M.D.   Dg Chest 2 View  05/07/2011  *RADIOLOGY REPORT*  Clinical Data: Confusion, shortness of breath, history hypertension, diabetes, coronary artery disease, hypertrophic cardiomyopathy  CHEST - 2 VIEW  Comparison: 12/29/2008  Findings: Left subclavian sequential transvenous pacemaker leads project at right atrium and right ventricle. Normal heart size post CABG. Minimally prominent left superior mediastinal soft tissues stable, question related to tortuous innominate artery. Atherosclerotic calcification aorta. Pulmonary vascularity normal. Chronic elevation right diaphragm. Minimal bibasilar atelectasis. Aeration at left base improved since previous exam. Upper lungs clear. Bones diffusely demineralized.  IMPRESSION: Post CABG and pacemaker. Minimal bibasilar atelectasis and chronic elevation right diaphragm.  Original Report Authenticated By: Lollie Marrow, M.D.   Dg Pelvis 1-2 Views  05/07/2011   *RADIOLOGY REPORT*  Clinical Data: Hip discomfort post recent fall  PELVIS - 1-2 VIEW  Comparison: None  Findings: Osseous demineralization. Hip and SI joint spaces preserved. No acute fracture, dislocation, or bone destruction. Minimal increased attenuation of the femoral heads bilaterally likely degenerative in origin, corresponding to minimal subchondral sclerosis identified at the posterior aspects of the femoral heads on a prior CT. Scattered pelvic phleboliths. Degenerative disc and facet disease changes lower lumbar  spine.  IMPRESSION: Osseous demineralization with minimal degenerative changes of the hip joints bilaterally. No definite acute osseous abnormalities. If patient has persistent symptoms or an inability to bear weight, consider MR imaging of the hip without contrast to exclude occult fracture.  Original Report Authenticated By: Lollie Marrow, M.D.   Dg Hip Complete Left  05/04/2011  *RADIOLOGY REPORT*  Clinical Data: Left hip pain beginning yesterday  LEFT HIP - COMPLETE 2+ VIEW  Comparison: CT abdomen - 10/26/2008  Findings: No fracture or dislocation.  Mild degenerative changes of the left hip with joint space loss and osteophytosis.  No definite evidence of avascular necrosis.  Limited evaluation of the right hip suggests grossly symmetric degenerative change.  Limited visualization of bilateral SI joints and pubic symphysis is normal. Limited visualization of the lumbar spine suggests degenerative change.  Surgical clip overlies the right mid hemiabdomen. Extensive vascular calcifications.  The  IMPRESSION: Mild degenerative change of the left hip.  Original Report Authenticated By: Waynard Reeds, M.D.   Ct Head Wo Contrast  05/07/2011  *RADIOLOGY REPORT*  Clinical Data: Hypoglycemia.  Unresponsive.  Improved with administration of glucose.  CT HEAD WITHOUT CONTRAST  Technique:  Contiguous axial images were obtained from the base of the skull through the vertex without contrast.   Comparison: 12/31/2008.  Findings: No intracranial hemorrhage.  Interval development of hypodensity within the right paracentral pontine region.  This may represent a small acute infarct.  Result of artifact not excluded.  Small vessel disease type changes.  Global atrophy without hydrocephalus.  Vascular calcifications.  No intracranial mass lesion detected on this unenhanced exam.  Opacification in the left sphenoid sinus.  IMPRESSION: Interval development of hypodensity within the right paracentral pontine region.  This may represent a small acute infarct.  Result of artifact not excluded.  Opacification left sphenoid sinus.  Original Report Authenticated By: Fuller Canada, M.D.   Dg Cystogram  05/07/2011  *RADIOLOGY REPORT*  Clinical Data: Ureteral obstruction.  Ureteral stent placement.  Comparison:  CT scan, same date.  Findings: Single fluoroscopic spot image demonstrates a left-sided ureteral stent with the tip in the region of the renal pelvis.  IMPRESSION: Left ureteral stent placement.  Original Report Authenticated By: P. Loralie Champagne, M.D.     PERTINENT LAB RESULTS: CBC: No results found for this basename: WBC:2,HGB:2,HCT:2,PLT:2 in the last 72 hours CMET CMP     Component Value Date/Time   NA 144 05/11/2011 0545   K 3.3* 05/11/2011 0545   CL 105 05/11/2011 0545   CO2 28 05/11/2011 0545   GLUCOSE 92 05/11/2011 0545   BUN 26* 05/11/2011 0545   CREATININE 1.09 05/11/2011 0545   CALCIUM 8.2* 05/11/2011 0545   PROT 6.3 05/07/2011 1452   ALBUMIN 3.1* 05/07/2011 1452   AST 27 05/07/2011 1452   ALT 13 05/07/2011 1452   ALKPHOS 56 05/07/2011 1452   BILITOT 0.2* 05/07/2011 1452   GFRNONAA 45* 05/11/2011 0545   GFRAA 52* 05/11/2011 0545    GFR Estimated Creatinine Clearance: 37.5 ml/min (by C-G formula based on Cr of 1.09). No results found for this basename: LIPASE:2,AMYLASE:2 in the last 72 hours No results found for this basename: CKTOTAL:3,CKMB:3,CKMBINDEX:3,TROPONINI:3 in the last 72  hours No components found with this basename: POCBNP:3 No results found for this basename: DDIMER:2 in the last 72 hours No results found for this basename: HGBA1C:2 in the last 72 hours No results found for this basename: CHOL:2,HDL:2,LDLCALC:2,TRIG:2,CHOLHDL:2,LDLDIRECT:2 in the last 72 hours No results  found for this basename: TSH,T4TOTAL,FREET3,T3FREE,THYROIDAB in the last 72 hours No results found for this basename: VITAMINB12:2,FOLATE:2,FERRITIN:2,TIBC:2,IRON:2,RETICCTPCT:2 in the last 72 hours Coags: No results found for this basename: PT:2,INR:2 in the last 72 hours Microbiology: Recent Results (from the past 240 hour(s))  URINE CULTURE     Status: Normal   Collection Time   05/07/11 12:04 PM      Component Value Range Status Comment   Specimen Description URINE, CATHETERIZED   Final    Special Requests NONE   Final    Culture  Setup Time [id number]   Final    Colony Count NO GROWTH   Final    Culture NO GROWTH   Final    Report Status 05/08/2011 FINAL   Final   MRSA PCR SCREENING     Status: Normal   Collection Time   05/07/11  8:43 PM      Component Value Range Status Comment   MRSA by PCR NEGATIVE  NEGATIVE  Final      BRIEF HOSPITAL COURSE:  Principal Problem:  *Acute renal failure (ARF) -Due to Nephrolithiasis s/p Left J stent being followed by Urology  Cr improved back to normal.  Per Urology last note-no further treatment of her ureteral stone to be done as a outpatient..  Resume antiplatelet agents-as no further procedures planned this admit-patient will follow up with Urology, and then antiplatelet agents can be discontinued in a planned manner-whenever she is going to undergo definite therapy for the kidney stones. Follow up appointment with urology has been arranged  Metabolic acidosis  Resolved with improvement in renal function -likely secondary to ARF with metformin use contributing  Dementia with delirium -resolved, has underlying dementia  DM type 2  (diabetes mellitus, type 2) -required SSI during hospitalization -since all electrolytes are all back to normal, will resume usual hypoglycemic agents on discharge -follow up with PCP for further management  CAD (coronary artery disease)  Resume antiplatelet agents -will need to stop these when definite date of urologic procedures are planned  HTN (hypertension), benign  Lopressor resumed-continue on discharge  Dyslipidemia  -resume statins on discharge   RA  -previously on chronic steroids, resume prior dosing of prednisone on discharge  TODAY-DAY OF DISCHARGE:  Subjective:   Aeralyn Barna today has no headache,no chest abdominal pain,no new weakness tingling or numbness, feels much better wants to go home today.   Objective:   Blood pressure 149/81, pulse 97, temperature 98.1 F (36.7 C), temperature source Oral, resp. rate 18, height 5\' 4"  (1.626 m), weight 72.8 kg (160 lb 7.9 oz), SpO2 92.00%.  Intake/Output Summary (Last 24 hours) at 05/13/11 1004 Last data filed at 05/12/11 1100  Gross per 24 hour  Intake    240 ml  Output      0 ml  Net    240 ml    Exam Awake Alert, Oriented *3, No new F.N deficits, Normal affect [place].AT,PERRAL Supple Neck,No JVD, No cervical lymphadenopathy appriciated.  Symmetrical Chest wall movement, Good air movement bilaterally, CTAB RRR,No Gallops,Rubs or new Murmurs, No Parasternal Heave +ve B.Sounds, Abd Soft, Non tender, No organomegaly appriciated, No rebound -guarding or rigidity. No Cyanosis, Clubbing or edema, No new Rash or bruise  DISPOSITION: Home  DISCHARGE INSTRUCTIONS:    Follow-up Information    Follow up with Jethro Bolus I, MD on 05/21/2011. (appt at 9:30 am)    Contact information:   [address] [place] [facility] [place] [address] [phone]  Follow up with Kaleen Mask, MD. Schedule an appointment as soon as possible for a visit in 1 week.    Contact information:   [address] [place] [address] [phone]         Total Time spent on discharge equals 45 minutes.  SignedJeoffrey Massed 05/13/2011 10:04 AM

## 2011-05-13 NOTE — Progress Notes (Signed)
   CARE MANAGEMENT NOTE 05/13/2011  Patient:  SHEVY, YANEY   Account Number:  [id number]  Date Initiated:  05/13/2011  Documentation initiated by:  Donn Pierini  Subjective/Objective Assessment:   Pt admitted with acute renal failure     Action/Plan:   PTA pt lived at home with son, PT eval ordered   Anticipated DC Date:  05/13/2011   Anticipated DC Plan:  HOME/SELF CARE      DC Planning Services  CM consult      Choice offered to / List presented to:             Status of service:  Completed, signed off Medicare Important Message given?   (If response is "NO", the following Medicare IM given date fields will be blank) Date Medicare IM given:   Date Additional Medicare IM given:    Discharge Disposition:  HOME/SELF CARE  Per UR Regulation:  Reviewed for med. necessity/level of care/duration of stay  If discussed at Long Length of Stay Meetings, dates discussed:    Comments:  PCP-  Windle Guard  05/13/11- 1020- Donn Pierini RN, BSN [phone] Pt for discharge today, per PT notes no f/u needed. Plan to return home with son.

## 2011-05-13 NOTE — Progress Notes (Signed)
Reviewed d/c instructions with pt and son.  No new Rxs given.  All questions answered.  Pt has equipment needed at home.  Pt d/c via wheelchair in stable condition.

## 2011-05-16 ENCOUNTER — Encounter: Payer: Self-pay | Admitting: Cardiology

## 2011-05-16 ENCOUNTER — Ambulatory Visit (INDEPENDENT_AMBULATORY_CARE_PROVIDER_SITE_OTHER): Payer: Medicare Other | Admitting: Cardiology

## 2011-05-16 VITALS — BP 122/80 | HR 74 | Ht 63.0 in | Wt 145.4 lb

## 2011-05-16 DIAGNOSIS — I2510 Atherosclerotic heart disease of native coronary artery without angina pectoris: Secondary | ICD-10-CM

## 2011-05-16 DIAGNOSIS — N179 Acute kidney failure, unspecified: Secondary | ICD-10-CM

## 2011-05-16 DIAGNOSIS — I442 Atrioventricular block, complete: Secondary | ICD-10-CM

## 2011-05-16 DIAGNOSIS — Z951 Presence of aortocoronary bypass graft: Secondary | ICD-10-CM

## 2011-05-16 DIAGNOSIS — I422 Other hypertrophic cardiomyopathy: Secondary | ICD-10-CM

## 2011-05-16 DIAGNOSIS — I10 Essential (primary) hypertension: Secondary | ICD-10-CM

## 2011-05-16 NOTE — Patient Instructions (Signed)
Continue your current medication  Once urologic treatment is complete resume ASA and Plavix.  I will see you again in 6 months.

## 2011-05-16 NOTE — Progress Notes (Signed)
[name] Date of Birth: Mar 15, 1924   History of Present Illness: [name] is seen for followup visit today. She is seen with her son. She has a history of coronary disease and is status post CABG in March of 2007. This included an LIMA graft to the distal LAD, saphenous vein graft to the diagonal, saphenous vein graft to the intermediate branch, and saphenous vein graft to the distal right coronary. She also had a septal myectomy at that time for hypertrophic cardiomyopathy. She is status post pacemaker implant for complete heart block. She recently was hospitalized with acute renal failure and hypoglycemia. Creatinine increased 8. She had obstructive uropathy with a stone in her left ureter. She has an absent right kidney. She had a stent placed with alleviation of the obstruction and return of her renal functions are normal. She still feels tired. She denies any chest pain or shortness of breath. Her last pacemaker check in February was satisfactory.  Current Outpatient Prescriptions on File Prior to Visit  Medication Sig Dispense Refill  . aspirin EC 81 MG tablet Take 81 mg by mouth daily.      . Calcium Carbonate (CALCIUM 500 PO) Take 1 tablet by mouth daily.      Marland Kitchen estradiol (ESTRING) 2 MG vaginal ring Place 2 mg vaginally every 3 (three) months. follow package directions      . glimepiride (AMARYL) 4 MG tablet Take 2 mg by mouth daily before breakfast.        . HYDROcodone-acetaminophen (VICODIN) 5-500 MG per tablet Take 1 tablet by mouth every 6 (six) hours as needed. For pain.      . Lactobacillus (ACIDOPHILUS PO) Take 460 mg by mouth daily.      Marland Kitchen latanoprost (XALATAN) 0.005 % ophthalmic solution Place 1 drop into the left eye daily.      Marland Kitchen lovastatin (MEVACOR) 20 MG tablet Take 20 mg by mouth every evening.      . metFORMIN (GLUCOPHAGE) 1000 MG tablet Take 1,000 mg by mouth 2 (two) times daily.       . metoprolol (LOPRESSOR) 50 MG tablet Take 25 mg by mouth 2 (two) times daily.        . Multiple Vitamin (MULITIVITAMIN WITH MINERALS) TABS Take 1 tablet by mouth daily.      . Multiple Vitamins-Minerals (ICAPS PO) Take 1 capsule by mouth daily.      . niacin 500 MG tablet Take 500 mg by mouth 2 (two) times daily.      . nitroGLYCERIN (NITROSTAT) 0.4 MG SL tablet Place 0.4 mg under the tongue every 5 (five) minutes as needed. For chest pain.      Bertram Gala Glycol-Propyl Glycol (SYSTANE OP) Apply 1-2 drops to eye as needed. For eye irritation      . predniSONE (DELTASONE) 5 MG tablet Take 5 mg by mouth daily.       . raloxifene (EVISTA) 60 MG tablet Take 60 mg by mouth daily.        . ranitidine (ZANTAC) 300 MG tablet Take 150 mg by mouth 2 (two) times daily.      Marland Kitchen trimethoprim (TRIMPEX) 100 MG tablet Take 100 mg by mouth every evening.       Marland Kitchen amitriptyline-chlordiazePOXIDE (LIMBITROL) 12.5-5 MG per tablet Take 1 tablet by mouth at bedtime.      . clopidogrel (PLAVIX) 75 MG tablet Take 75 mg by mouth daily.      . Vitamin D, Ergocalciferol, (DRISDOL) 50000 UNITS  CAPS Take 50,000 Units by mouth every 14 (fourteen) days.      Marland Kitchen DISCONTD: CALCIUM PO Take by mouth.        . DISCONTD: nitroGLYCERIN (NITROSTAT) 0.4 MG SL tablet Place 1 tablet (0.4 mg total) under the tongue every 5 (five) minutes as needed for chest pain.  25 tablet  11    Allergies  Allergen Reactions  . Codeine Other (See Comments)    Blacks out    Past Medical History  Diagnosis Date  . Pacemaker   . CAD (coronary artery disease)   . CHB (complete heart block)   . HTN (hypertension), benign   . DM (diabetes mellitus)   . Hypertrophic cardiomyopathy   . Hyperlipidemia   . OP (osteoporosis)   . Hx: UTI (urinary tract infection)   . Fatigue   . Rheumatoid arthritis   . Chronic steroid use   . Confusion   . Pancreatic pseudocyst   . Diverticulosis   . Renal calculus     Past Surgical History  Procedure Date  . Insert / replace / remove pacemaker   . Cardiac catheterization 04/17/2005     EF 65%  . Septal myectomy   . Nephrectomy     RIGHT  . Arm fracture surgery     RIGHT ARM  . Coronary artery bypass graft 04/2005    LIMA GRAFT TO THE DISTAL LAD, SAPHENOUS VEIN GRAFT TO THE DIAGONAL, SAPHENOUS VEIN GRAFT TO THE INTERMEDIATE BRANCH, AND SAPHENOUS VEIN GRAFT TO THE DISTAL RIGHT CORONARY  . Myomectomy   . Cataract extraction, bilateral   . Cystoscopy 05/07/2011    Procedure: CYSTOSCOPY;  Surgeon: Crecencio Mc, MD;  Location: [facility];  Service: Urology;  Laterality: Left;    History  Smoking status  . Former Smoker -- 1.0 packs/day  . Types: Cigarettes  . Quit date: 05/07/1990  Smokeless tobacco  . Never Used    History  Alcohol Use No    Family History  Problem Relation Age of Onset  . Lung cancer Mother   . Cancer Father   . Heart attack Brother     Review of Systems: As noted in history of present illness.  All other systems were reviewed and are negative.  Physical Exam: BP 122/80  Pulse 74  Ht 5\' 3"  (1.6 m)  Wt 145 lb 6.4 oz (65.953 kg)  BMI 25.76 kg/m2 She is an elderly white female in no acute distress. She is normocephalic, atraumatic. Pupils are equal round and reactive to light and accommodation. Extraocular movements are full. Oropharynx is clear. Neck is supple without JVD, adenopathy, thyromegaly, or bruits. Lungs are clear. Cardiac exam reveals a grade 1/6 systolic ejection murmur at the right upper sternal border. Abdomen is soft and nontender. Her knees are without edema. She has good pedal pulses. She has extensive arthritic changes in her hands. Her neurologic exam is nonfocal. LABORATORY DATA: Reviewed from the hospital. ECG showed normal sinus rhythm with a interventricular conduction delay.  Assessment / Plan:

## 2011-05-16 NOTE — Assessment & Plan Note (Signed)
History complete heart block following septal myectomy. She has stable pacemaker function.

## 2011-05-16 NOTE — Assessment & Plan Note (Signed)
She is status post septal myectomy in 2007. She remains asymptomatic. No recent congestive heart failure.

## 2011-05-16 NOTE — Assessment & Plan Note (Signed)
She is status post CABG in 2007. She has no significant anginal symptoms. We will continue with Plavix, metoprolol, and statin therapy.

## 2011-05-16 NOTE — Assessment & Plan Note (Signed)
Recent obstructive uropathy secondary to renal calculus with acute renal failure. Renal function had returned to normal at discharge. She will follow up with urology.

## 2011-05-17 ENCOUNTER — Inpatient Hospital Stay (HOSPITAL_COMMUNITY)
Admission: EM | Admit: 2011-05-17 | Discharge: 2011-05-20 | DRG: 690 | Disposition: A | Discharge status: Home / Self Care | Payer: Medicare Other | Attending: Internal Medicine | Admitting: Internal Medicine

## 2011-05-17 ENCOUNTER — Encounter (HOSPITAL_COMMUNITY): Payer: Self-pay

## 2011-05-17 DIAGNOSIS — Z905 Acquired absence of kidney: Secondary | ICD-10-CM

## 2011-05-17 DIAGNOSIS — I2510 Atherosclerotic heart disease of native coronary artery without angina pectoris: Secondary | ICD-10-CM

## 2011-05-17 DIAGNOSIS — N189 Chronic kidney disease, unspecified: Secondary | ICD-10-CM | POA: Diagnosis present

## 2011-05-17 DIAGNOSIS — I10 Essential (primary) hypertension: Secondary | ICD-10-CM

## 2011-05-17 DIAGNOSIS — Z951 Presence of aortocoronary bypass graft: Secondary | ICD-10-CM

## 2011-05-17 DIAGNOSIS — N390 Urinary tract infection, site not specified: Principal | ICD-10-CM

## 2011-05-17 DIAGNOSIS — I442 Atrioventricular block, complete: Secondary | ICD-10-CM

## 2011-05-17 DIAGNOSIS — E119 Type 2 diabetes mellitus without complications: Secondary | ICD-10-CM

## 2011-05-17 DIAGNOSIS — E872 Acidosis, unspecified: Secondary | ICD-10-CM

## 2011-05-17 DIAGNOSIS — E785 Hyperlipidemia, unspecified: Secondary | ICD-10-CM

## 2011-05-17 DIAGNOSIS — E876 Hypokalemia: Secondary | ICD-10-CM | POA: Diagnosis present

## 2011-05-17 DIAGNOSIS — N201 Calculus of ureter: Secondary | ICD-10-CM

## 2011-05-17 DIAGNOSIS — M810 Age-related osteoporosis without current pathological fracture: Secondary | ICD-10-CM | POA: Diagnosis present

## 2011-05-17 DIAGNOSIS — I422 Other hypertrophic cardiomyopathy: Secondary | ICD-10-CM

## 2011-05-17 DIAGNOSIS — E875 Hyperkalemia: Secondary | ICD-10-CM

## 2011-05-17 DIAGNOSIS — Z87440 Personal history of urinary (tract) infections: Secondary | ICD-10-CM

## 2011-05-17 DIAGNOSIS — I129 Hypertensive chronic kidney disease with stage 1 through stage 4 chronic kidney disease, or unspecified chronic kidney disease: Secondary | ICD-10-CM | POA: Diagnosis present

## 2011-05-17 DIAGNOSIS — Z87891 Personal history of nicotine dependence: Secondary | ICD-10-CM

## 2011-05-17 DIAGNOSIS — E162 Hypoglycemia, unspecified: Secondary | ICD-10-CM

## 2011-05-17 DIAGNOSIS — Z950 Presence of cardiac pacemaker: Secondary | ICD-10-CM

## 2011-05-17 DIAGNOSIS — N312 Flaccid neuropathic bladder, not elsewhere classified: Secondary | ICD-10-CM | POA: Diagnosis present

## 2011-05-17 DIAGNOSIS — N179 Acute kidney failure, unspecified: Secondary | ICD-10-CM

## 2011-05-17 DIAGNOSIS — R4182 Altered mental status, unspecified: Secondary | ICD-10-CM

## 2011-05-17 DIAGNOSIS — R627 Adult failure to thrive: Secondary | ICD-10-CM | POA: Diagnosis present

## 2011-05-17 DIAGNOSIS — N1330 Unspecified hydronephrosis: Secondary | ICD-10-CM

## 2011-05-17 DIAGNOSIS — F0390 Unspecified dementia without behavioral disturbance: Secondary | ICD-10-CM | POA: Diagnosis present

## 2011-05-17 DIAGNOSIS — N289 Disorder of kidney and ureter, unspecified: Secondary | ICD-10-CM

## 2011-05-17 NOTE — ED Notes (Signed)
Pt from home.  Pt with hx of alzheimers/dementia.  Pt having dinner tonight and more confused than normal per family.  CBG in route 91.  Vitals 116/74, 80, 18.

## 2011-05-17 NOTE — ED Notes (Signed)
ZOX:[id number]<AV> Expected date:05/17/11<BR> Expected time:<BR> Means of arrival:<BR> Comments:<BR> EMS 10 GC - aloc

## 2011-05-18 ENCOUNTER — Other Ambulatory Visit: Payer: Self-pay

## 2011-05-18 ENCOUNTER — Emergency Department (HOSPITAL_COMMUNITY): Payer: Medicare Other

## 2011-05-18 ENCOUNTER — Encounter (HOSPITAL_COMMUNITY): Payer: Self-pay | Admitting: *Deleted

## 2011-05-18 LAB — CBC
MCH: 30.2 pg (ref 26.0–34.0)
MCHC: 32.8 g/dL (ref 30.0–36.0)
MCV: 91.9 fL (ref 78.0–100.0)
Platelets: 253 10*3/uL (ref 150–400)
RBC: 3.71 MIL/uL — ABNORMAL LOW (ref 3.87–5.11)
RDW: 13.9 % (ref 11.5–15.5)

## 2011-05-18 LAB — GLUCOSE, CAPILLARY
Glucose-Capillary: 79 mg/dL (ref 70–99)
Glucose-Capillary: 101 mg/dL — ABNORMAL HIGH (ref 70–99)
Glucose-Capillary: 124 mg/dL — ABNORMAL HIGH (ref 70–99)
Glucose-Capillary: 189 mg/dL — ABNORMAL HIGH (ref 70–99)

## 2011-05-18 LAB — CARDIAC PANEL(CRET KIN+CKTOT+MB+TROPI)
CK, MB: 1.6 ng/mL (ref 0.3–4.0)
CK, MB: 1.7 ng/mL (ref 0.3–4.0)
Relative Index: INVALID (ref 0.0–2.5)
Relative Index: INVALID (ref 0.0–2.5)
Total CK: 25 U/L (ref 7–177)
Total CK: 20 U/L (ref 7–177)

## 2011-05-18 LAB — HEMOGLOBIN A1C
Hgb A1c MFr Bld: 6.8 % — ABNORMAL HIGH (ref <5.7)
Mean Plasma Glucose: 148 mg/dL — ABNORMAL HIGH (ref <117)

## 2011-05-18 LAB — URINALYSIS, MICROSCOPIC ONLY
Ketones, ur: NEGATIVE mg/dL
Protein, ur: 100 mg/dL — AB
Urobilinogen, UA: 0.2 mg/dL (ref 0.0–1.0)

## 2011-05-18 LAB — BASIC METABOLIC PANEL
Calcium: 8.9 mg/dL (ref 8.4–10.5)
Creatinine, Ser: 1.24 mg/dL — ABNORMAL HIGH (ref 0.50–1.10)
GFR calc Af Amer: 45 mL/min — ABNORMAL LOW (ref >90)
GFR calc non Af Amer: 39 mL/min — ABNORMAL LOW (ref >90)
Sodium: 139 mEq/L (ref 135–145)

## 2011-05-18 LAB — PROTIME-INR: Prothrombin Time: 13.5 seconds (ref 11.6–15.2)

## 2011-05-18 LAB — APTT: aPTT: 30 seconds (ref 24–37)

## 2011-05-18 MED ORDER — POLYVINYL ALCOHOL 1.4 % OP SOLN
1.0000 [drp] | Freq: Every day | OPHTHALMIC | Status: DC
  Administered 2011-05-18 – 2011-05-19 (×2): 1 [drp] via OPHTHALMIC
  Filled 2011-05-18: qty 15

## 2011-05-18 MED ORDER — RISAQUAD PO CAPS
1.0000 | ORAL_CAPSULE | Freq: Every day | ORAL | Status: DC
  Administered 2011-05-18 – 2011-05-20 (×3): 1 via ORAL
  Filled 2011-05-18 (×4): qty 1

## 2011-05-18 MED ORDER — CLOPIDOGREL BISULFATE 75 MG PO TABS
75.0000 mg | ORAL_TABLET | Freq: Every day | ORAL | Status: DC
  Administered 2011-05-18 – 2011-05-20 (×3): 75 mg via ORAL
  Filled 2011-05-18 (×4): qty 1

## 2011-05-18 MED ORDER — METOPROLOL TARTRATE 25 MG PO TABS
25.0000 mg | ORAL_TABLET | Freq: Two times a day (BID) | ORAL | Status: DC
  Administered 2011-05-18 – 2011-05-20 (×5): 25 mg via ORAL
  Filled 2011-05-18 (×6): qty 1

## 2011-05-18 MED ORDER — POLYETHYL GLYCOL-PROPYL GLYCOL 0.4-0.3 % OP SOLN: 1.0000 [drp] | Freq: Every day | OPHTHALMIC | Status: DC

## 2011-05-18 MED ORDER — FLUCONAZOLE 100 MG PO TABS
100.0000 mg | ORAL_TABLET | Freq: Every day | ORAL | Status: DC
  Administered 2011-05-19 – 2011-05-20 (×2): 100 mg via ORAL
  Filled 2011-05-18 (×2): qty 1

## 2011-05-18 MED ORDER — ENOXAPARIN SODIUM 30 MG/0.3ML ~~LOC~~ SOLN
30.0000 mg | SUBCUTANEOUS | Status: DC
  Administered 2011-05-18 – 2011-05-19 (×2): 30 mg via SUBCUTANEOUS
  Filled 2011-05-18 (×3): qty 0.3

## 2011-05-18 MED ORDER — FAMOTIDINE 40 MG PO TABS
40.0000 mg | ORAL_TABLET | Freq: Two times a day (BID) | ORAL | Status: DC
  Administered 2011-05-18 – 2011-05-20 (×5): 40 mg via ORAL
  Filled 2011-05-18 (×6): qty 1

## 2011-05-18 MED ORDER — DEXTROSE 5 % IV SOLN
1.0000 g | INTRAVENOUS | Status: DC
  Administered 2011-05-19 – 2011-05-20 (×2): 1 g via INTRAVENOUS
  Filled 2011-05-18 (×4): qty 10

## 2011-05-18 MED ORDER — ONDANSETRON HCL 4 MG/2ML IJ SOLN: 4.0000 mg | Freq: Four times a day (QID) | INTRAMUSCULAR | Status: DC | PRN

## 2011-05-18 MED ORDER — SODIUM CHLORIDE 0.9 % IV SOLN
INTRAVENOUS | Status: DC
  Administered 2011-05-18: 75 mL/h via INTRAVENOUS
  Administered 2011-05-19 – 2011-05-20 (×3): via INTRAVENOUS

## 2011-05-18 MED ORDER — SIMVASTATIN 10 MG PO TABS
10.0000 mg | ORAL_TABLET | Freq: Every day | ORAL | Status: DC
  Administered 2011-05-18 – 2011-05-19 (×2): 10 mg via ORAL
  Filled 2011-05-18 (×3): qty 1

## 2011-05-18 MED ORDER — SODIUM CHLORIDE 0.9 % IJ SOLN: 3.0000 mL | Freq: Two times a day (BID) | INTRAMUSCULAR | Status: DC

## 2011-05-18 MED ORDER — HYDROCODONE-ACETAMINOPHEN 5-325 MG PO TABS
1.0000 | ORAL_TABLET | ORAL | Status: DC | PRN
  Administered 2011-05-18 – 2011-05-20 (×3): 1 via ORAL
  Filled 2011-05-18 (×4): qty 1

## 2011-05-18 MED ORDER — ASPIRIN EC 81 MG PO TBEC
81.0000 mg | DELAYED_RELEASE_TABLET | Freq: Every day | ORAL | Status: DC
  Administered 2011-05-18 – 2011-05-20 (×3): 81 mg via ORAL
  Filled 2011-05-18 (×4): qty 1

## 2011-05-18 MED ORDER — SODIUM CHLORIDE 0.9 % IV BOLUS (SEPSIS)
500.0000 mL | Freq: Once | INTRAVENOUS | Status: AC
  Administered 2011-05-18: 500 mL via INTRAVENOUS

## 2011-05-18 MED ORDER — INSULIN ASPART 100 UNIT/ML ~~LOC~~ SOLN
0.0000 [IU] | Freq: Three times a day (TID) | SUBCUTANEOUS | Status: DC
  Administered 2011-05-19: 1 [IU] via SUBCUTANEOUS
  Administered 2011-05-19: 5 [IU] via SUBCUTANEOUS
  Administered 2011-05-20: 2 [IU] via SUBCUTANEOUS

## 2011-05-18 MED ORDER — SODIUM CHLORIDE 0.9 % IV SOLN: INTRAVENOUS | Status: DC

## 2011-05-18 MED ORDER — ONDANSETRON HCL 4 MG PO TABS: 4.0000 mg | ORAL_TABLET | Freq: Four times a day (QID) | ORAL | Status: DC | PRN

## 2011-05-18 MED ORDER — DEXTROSE 5 % IV SOLN
1.0000 g | Freq: Once | INTRAVENOUS | Status: AC
  Administered 2011-05-18: 1 g via INTRAVENOUS
  Filled 2011-05-18: qty 10

## 2011-05-18 MED ORDER — MAGNESIUM SULFATE 40 MG/ML IJ SOLN
2.0000 g | Freq: Once | INTRAMUSCULAR | Status: AC
  Administered 2011-05-18: 2 g via INTRAVENOUS
  Filled 2011-05-18: qty 50

## 2011-05-18 MED ORDER — RALOXIFENE HCL 60 MG PO TABS
60.0000 mg | ORAL_TABLET | Freq: Every day | ORAL | Status: DC
  Administered 2011-05-18 – 2011-05-20 (×3): 60 mg via ORAL
  Filled 2011-05-18 (×4): qty 1

## 2011-05-18 MED ORDER — PREDNISONE 5 MG PO TABS
5.0000 mg | ORAL_TABLET | Freq: Every day | ORAL | Status: DC
  Administered 2011-05-18 – 2011-05-20 (×3): 5 mg via ORAL
  Filled 2011-05-18 (×4): qty 1

## 2011-05-18 MED ORDER — INSULIN ASPART 100 UNIT/ML ~~LOC~~ SOLN
0.0000 [IU] | SUBCUTANEOUS | Status: DC
  Administered 2011-05-18: 1 [IU] via SUBCUTANEOUS
  Administered 2011-05-18: 2 [IU] via SUBCUTANEOUS

## 2011-05-18 MED ORDER — ACIDOPHILUS PO CAPS: 1.0000 | ORAL_CAPSULE | Freq: Every day | ORAL | Status: DC

## 2011-05-18 MED ORDER — LATANOPROST 0.005 % OP SOLN
1.0000 [drp] | Freq: Every day | OPHTHALMIC | Status: DC
  Administered 2011-05-18 – 2011-05-19 (×2): 1 [drp] via OPHTHALMIC
  Filled 2011-05-18: qty 2.5

## 2011-05-18 NOTE — ED Notes (Signed)
CBG registered 70 on ED Glucometer. Applesauce and milk provided until breakfast tray arrives.

## 2011-05-18 NOTE — H&P (Signed)
PCP:  Kaleen Mask, MD, MD   DOA:  05/17/2011 10:49 PM  Chief Complaint:  Altered Mental status  HPI: Pt presents with c/o brief episode tonight of changes in her speech. Her son states that for approx 5 minutes she was talking but her words were mixed up and not making sense. He states there was some slurring of the words. She states she remembers feeling that she couldn't get her words out correctly. She has a hx of dementia- son states that usually she is able to carry on a normal conversation. She denies any changes in vision, no extremity weakness, no headache. Denies any specific aggravating or alleviating factors.    Allergies: Allergies  Allergen Reactions  . Codeine Other (See Comments)    Blacks out    Prior to Admission medications   Medication Sig Start Date End Date Taking? Authorizing Provider  amitriptyline-chlordiazePOXIDE (LIMBITROL) 12.5-5 MG per tablet Take 1 tablet by mouth at bedtime.   Yes Historical Provider, MD  aspirin EC 81 MG tablet Take 81 mg by mouth daily.   Yes Historical Provider, MD  Calcium Carbonate (CALCIUM 500 PO) Take 1 tablet by mouth daily.   Yes Historical Provider, MD  clopidogrel (PLAVIX) 75 MG tablet Take 75 mg by mouth daily.   Yes Historical Provider, MD  estradiol (ESTRING) 2 MG vaginal ring Place 2 mg vaginally every 3 (three) months. follow package directions   Yes Historical Provider, MD  glimepiride (AMARYL) 4 MG tablet Take 2 mg by mouth daily before breakfast.     Yes Historical Provider, MD  HYDROcodone-acetaminophen (VICODIN) 5-500 MG per tablet Take 1 tablet by mouth every 6 (six) hours as needed. For pain.   Yes Historical Provider, MD  Lactobacillus (ACIDOPHILUS PO) Take 460 mg by mouth daily.   Yes Historical Provider, MD  latanoprost (XALATAN) 0.005 % ophthalmic solution Place 1 drop into the left eye daily.   Yes Historical Provider, MD  lovastatin (MEVACOR) 20 MG tablet Take 20 mg by mouth every evening.   Yes  Historical Provider, MD  metFORMIN (GLUCOPHAGE) 1000 MG tablet Take 1,000 mg by mouth 2 (two) times daily.    Yes Historical Provider, MD  metoprolol (LOPRESSOR) 50 MG tablet Take 25 mg by mouth 2 (two) times daily.    Yes Historical Provider, MD  Multiple Vitamin (MULITIVITAMIN WITH MINERALS) TABS Take 1 tablet by mouth daily.   Yes Historical Provider, MD  Multiple Vitamins-Minerals (ICAPS PO) Take 1 capsule by mouth daily.   Yes Historical Provider, MD  niacin 500 MG tablet Take 500 mg by mouth 2 (two) times daily.   Yes Historical Provider, MD  Polyethyl Glycol-Propyl Glycol (SYSTANE OP) Apply 1-2 drops to eye as needed. For eye irritation   Yes Historical Provider, MD  predniSONE (DELTASONE) 5 MG tablet Take 5 mg by mouth daily.    Yes Historical Provider, MD  PRESCRIPTION MEDICATION See admin instructions. Antibiotic possibly ciprofloxacin will call pharmacy when open to clarify 05/14/11  Yes Historical Provider, MD  raloxifene (EVISTA) 60 MG tablet Take 60 mg by mouth daily.     Yes Historical Provider, MD  ranitidine (ZANTAC) 300 MG tablet Take 150 mg by mouth 2 (two) times daily.   Yes Historical Provider, MD  Vitamin D, Ergocalciferol, (DRISDOL) 50000 UNITS CAPS Take 50,000 Units by mouth every 14 (fourteen) days.   Yes Historical Provider, MD  nitroGLYCERIN (NITROSTAT) 0.4 MG SL tablet Place 0.4 mg under the tongue every 5 (five) minutes as needed.  For chest pain. 10/02/10 10/02/11  Peter M [place], MD    Past Medical History  Diagnosis Date  . Pacemaker   . CAD (coronary artery disease)   . CHB (complete heart block)   . HTN (hypertension), benign   . DM (diabetes mellitus)   . Hypertrophic cardiomyopathy   . Hyperlipidemia   . OP (osteoporosis)   . Hx: UTI (urinary tract infection)   . Fatigue   . Rheumatoid arthritis   . Chronic steroid use   . Confusion   . Pancreatic pseudocyst   . Diverticulosis   . Renal calculus     Past Surgical History  Procedure Date  . Insert /  replace / remove pacemaker   . Cardiac catheterization 04/17/2005    EF 65%  . Septal myectomy   . Nephrectomy     RIGHT  . Arm fracture surgery     RIGHT ARM  . Coronary artery bypass graft 04/2005    LIMA GRAFT TO THE DISTAL LAD, SAPHENOUS VEIN GRAFT TO THE DIAGONAL, SAPHENOUS VEIN GRAFT TO THE INTERMEDIATE BRANCH, AND SAPHENOUS VEIN GRAFT TO THE DISTAL RIGHT CORONARY  . Myomectomy   . Cataract extraction, bilateral   . Cystoscopy 05/07/2011    Procedure: CYSTOSCOPY;  Surgeon: Crecencio Mc, MD;  Location: [facility];  Service: Urology;  Laterality: Left;    Social History:  reports that she quit smoking about 21 years ago. Her smoking use included Cigarettes. She smoked 1 pack per day. She has never used smokeless tobacco. She reports that she does not drink alcohol or use illicit drugs.  Family History  Problem Relation Age of Onset  . Lung cancer Mother   . Cancer Father   . Heart attack Brother     Review of Systems:  Per HPI   Physical Exam:  Filed Vitals:   05/17/11 2310 05/18/11 0549 05/18/11 0722 05/18/11 0729  BP: 132/61 145/70 138/63   Pulse: 87 89 99   Temp: 98.4 F (36.9 C) 97.8 F (36.6 C) 98.5 F (36.9 C)   TempSrc: Oral Oral Oral   Resp: 20 20 18    SpO2: 94% 94% 93% 98%    Constitutional: Vital signs reviewed.  Patient is in no acute distress and cooperative with exam.  Head: Normocephalic and atraumatic Ear: TM normal bilaterally Mouth: no erythema or exudates, dry MM Eyes: PERRL, EOMI, conjunctivae normal, No scleral icterus.  Neck: Supple, Trachea midline normal ROM, No JVD, mass, thyromegaly, or carotid bruit present.  Cardiovascular: RRR, S1 normal, S2 normal, no MRG, pulses symmetric and intact bilaterally Pulmonary/Chest: CTAB with bibasilar crackles, no wheezes, rales, or rhonchi Abdominal: Soft. Non-tender, non-distended, bowel sounds are normal, no masses, organomegaly, or guarding present.  GU: no CVA tenderness Musculoskeletal: No joint  deformities, erythema, or stiffness, ROM full and no nontender Ext: no edema and no cyanosis, pulses palpable bilaterally (DP and PT) Hematology: no cervical, inginal, or axillary adenopathy.  Neurological: pt refuses to cooperate with exam at this time and says she is too tired, will attempt in the AM Skin: Warm, dry and intact. No rash, cyanosis, or clubbing.   Labs on Admission:  Results for orders placed during the hospital encounter of 05/17/11 (from the past 48 hour(s))  URINALYSIS, WITH MICROSCOPIC     Status: Abnormal   Collection Time   05/18/11  1:50 AM      Component Value Range Comment   Color, Urine YELLOW  YELLOW     APPearance TURBID (*) CLEAR  Specific Gravity, Urine 1.012  1.005 - 1.030     pH 6.0  5.0 - 8.0     Glucose, UA NEGATIVE  NEGATIVE (mg/dL)    Hgb urine dipstick LARGE (*) NEGATIVE     Bilirubin Urine NEGATIVE  NEGATIVE     Ketones, ur NEGATIVE  NEGATIVE (mg/dL)    Protein, ur 161 (*) NEGATIVE (mg/dL)    Urobilinogen, UA 0.2  0.0 - 1.0 (mg/dL)    Nitrite NEGATIVE  NEGATIVE     Leukocytes, UA LARGE (*) NEGATIVE     WBC, UA TOO NUMEROUS TO COUNT  <3 (WBC/hpf)    RBC / HPF FIELD OBSCURED BY WBC'S  <3 (RBC/hpf)    Squamous Epithelial / LPF MANY (*) RARE     Urine-Other MANY YEAST     CBC     Status: Abnormal   Collection Time   05/18/11  2:25 AM      Component Value Range Comment   WBC 10.4  4.0 - 10.5 (K/uL)    RBC 3.71 (*) 3.87 - 5.11 (MIL/uL)    Hemoglobin 11.2 (*) 12.0 - 15.0 (g/dL)    HCT 09.6 (*) 04.5 - 46.0 (%)    MCV 91.9  78.0 - 100.0 (fL)    MCH 30.2  26.0 - 34.0 (pg)    MCHC 32.8  30.0 - 36.0 (g/dL)    RDW 40.9  81.1 - 91.4 (%)    Platelets 253  150 - 400 (K/uL)   BASIC METABOLIC PANEL     Status: Abnormal   Collection Time   05/18/11  2:25 AM      Component Value Range Comment   Sodium 139  135 - 145 (mEq/L)    Potassium 3.4 (*) 3.5 - 5.1 (mEq/L)    Chloride 103  96 - 112 (mEq/L)    CO2 25  19 - 32 (mEq/L)    Glucose, Bld 76  70 - 99  (mg/dL)    BUN 13  6 - 23 (mg/dL)    Creatinine, Ser 7.82 (*) 0.50 - 1.10 (mg/dL)    Calcium 8.9  8.4 - 10.5 (mg/dL)    GFR calc non Af Amer 39 (*) >90 (mL/min)    GFR calc Af Amer 45 (*) >90 (mL/min)   PROTIME-INR     Status: Normal   Collection Time   05/18/11  2:25 AM      Component Value Range Comment   Prothrombin Time 13.5  11.6 - 15.2 (seconds)    INR 1.01  0.00 - 1.49    APTT     Status: Normal   Collection Time   05/18/11  2:25 AM      Component Value Range Comment   aPTT 30  24 - 37 (seconds)     Radiological Exams on Admission: No results found.  Assessment/Plan  Altered mental status - unclear etiology but likely secondary to progressive failure to thrive secondary to dementia, UTI - will admit to regular floor and provide supportive care - follow upon admission labs - supplement electrolytes as indicated - provide IVF - PT evaluation in the AM - provide abx for UTI  Hypokalemia - mild - will supplement  Anemia - Hg/Hct remain stable and at pt's baseline  Acute on Chronic renal failure - this is most likely pre renal in etiology - will provide IVF - BMP in AM  DVT Prophylaxis - Lovenox  Code Status - pending  Education  - test results and diagnostic studies were  discussed with patient and pt's family who was present at the bedside - patient and family have verbalized the understanding - questions were answered at the bedside and contact information was provided for additional questions or concerns  Time Spent on Admission: Over 30 minutes  MAGICK-Shekela Goodridge 05/18/2011, 7:38 AM  [facility] Pager # [phone] Main Office # [phone]

## 2011-05-18 NOTE — ED Notes (Signed)
Neuro strength equal bil.  Smile even.  Pt able to ambulate without assist to bathroom.

## 2011-05-18 NOTE — ED Provider Notes (Signed)
History     CSN: [id number]  Arrival date & time 05/17/11  2248   First MD Initiated Contact with Patient 05/18/11 0114      Chief Complaint  Patient presents with  . Altered Mental Status    (Consider location/radiation/quality/duration/timing/severity/associated sxs/prior treatment) HPI Pt presents with c/o brief episode tonight of changes in her speech.  Her son states that for approx 5 minutes she was talking but her words were mixed up and not making sense.  He states there was some slurring of the words.  She states she remembers feeling that she couldn't get her words out correctly.  She has a hx of dementia- son states that usually she is able to carry on a normal conversation.  She denies any changes in vision, no extremity weakness, no headache.  Per chart review, pt was recently admitted due to renal failure, had left ureteral stent placed.  Denies fever/chills.  No vomiting.  Has otherwise been doing well since discharge.  There are no other associated systemic symtpoms.  There are no alleviating or modifying factors.    Past Medical History  Diagnosis Date  . Pacemaker   . CAD (coronary artery disease)   . CHB (complete heart block)   . HTN (hypertension), benign   . DM (diabetes mellitus)   . Hypertrophic cardiomyopathy   . Hyperlipidemia   . OP (osteoporosis)   . Hx: UTI (urinary tract infection)   . Fatigue   . Rheumatoid arthritis   . Chronic steroid use   . Confusion   . Pancreatic pseudocyst   . Diverticulosis   . Renal calculus     Past Surgical History  Procedure Date  . Insert / replace / remove pacemaker   . Cardiac catheterization 04/17/2005    EF 65%  . Septal myectomy   . Nephrectomy     RIGHT  . Arm fracture surgery     RIGHT ARM  . Coronary artery bypass graft 04/2005    LIMA GRAFT TO THE DISTAL LAD, SAPHENOUS VEIN GRAFT TO THE DIAGONAL, SAPHENOUS VEIN GRAFT TO THE INTERMEDIATE BRANCH, AND SAPHENOUS VEIN GRAFT TO THE DISTAL RIGHT CORONARY  .  Myomectomy   . Cataract extraction, bilateral   . Cystoscopy 05/07/2011    Procedure: CYSTOSCOPY;  Surgeon: Crecencio Mc, MD;  Location: [facility] OR;  Service: Urology;  Laterality: Left;    Family History  Problem Relation Age of Onset  . Lung cancer Mother   . Cancer Father   . Heart attack Brother     History  Substance Use Topics  . Smoking status: Former Smoker -- 1.0 packs/day    Types: Cigarettes    Quit date: 05/07/1990  . Smokeless tobacco: Never Used  . Alcohol Use: No    OB History    Grav Para Term Preterm Abortions TAB SAB Ect Mult Living                  Review of Systems ROS reviewed and all otherwise negative except for mentioned in HPI  Allergies  Codeine  Home Medications   Current Outpatient Rx  Name Route Sig Dispense Refill  . CHLORDIAZEPOXIDE-AMITRIPTYLINE 5-12.5 MG PO TABS Oral Take 1 tablet by mouth at bedtime.    . ASPIRIN EC 81 MG PO TBEC Oral Take 81 mg by mouth daily.    Marland Kitchen CALCIUM 500 PO Oral Take 1 tablet by mouth daily.    Marland Kitchen CLOPIDOGREL BISULFATE 75 MG PO TABS Oral Take 75 mg by  mouth daily.    Marland Kitchen ESTRADIOL 2 MG VA RING Vaginal Place 2 mg vaginally every 3 (three) months. follow package directions    . GLIMEPIRIDE 4 MG PO TABS Oral Take 2 mg by mouth daily before breakfast.      . HYDROCODONE-ACETAMINOPHEN 5-500 MG PO TABS Oral Take 1 tablet by mouth every 6 (six) hours as needed. For pain.    Marland Kitchen ACIDOPHILUS PO Oral Take 460 mg by mouth daily.    Marland Kitchen LATANOPROST 0.005 % OP SOLN Left Eye Place 1 drop into the left eye daily.    Marland Kitchen LOVASTATIN 20 MG PO TABS Oral Take 20 mg by mouth every evening.    Marland Kitchen METFORMIN HCL 1000 MG PO TABS Oral Take 1,000 mg by mouth 2 (two) times daily.     Marland Kitchen METOPROLOL TARTRATE 50 MG PO TABS Oral Take 25 mg by mouth 2 (two) times daily.     . ADULT MULTIVITAMIN W/MINERALS CH Oral Take 1 tablet by mouth daily.    . ICAPS PO Oral Take 1 capsule by mouth daily.    Marland Kitchen NIACIN 500 MG PO TABS Oral Take 500 mg by mouth 2 (two) times  daily.    Frazier Butt OP Ophthalmic Apply 1-2 drops to eye as needed. For eye irritation    . PREDNISONE 5 MG PO TABS Oral Take 5 mg by mouth daily.     Marland Kitchen PRESCRIPTION MEDICATION  See admin instructions. Antibiotic possibly ciprofloxacin will call pharmacy when open to clarify    . RALOXIFENE HCL 60 MG PO TABS Oral Take 60 mg by mouth daily.      Marland Kitchen RANITIDINE HCL 300 MG PO TABS Oral Take 150 mg by mouth 2 (two) times daily.    Marland Kitchen VITAMIN D (ERGOCALCIFEROL) 50000 UNITS PO CAPS Oral Take 50,000 Units by mouth every 14 (fourteen) days.    Marland Kitchen NITROGLYCERIN 0.4 MG SL SUBL Sublingual Place 0.4 mg under the tongue every 5 (five) minutes as needed. For chest pain.      BP 138/63  Pulse 99  Temp(Src) 98.5 F (36.9 C) (Oral)  Resp 18  SpO2 98% Vitals reviewed Physical Exam Physical Examination: General appearance - alert, well appearing, and in no distress Mental status - alert, oriented to person, place, and time Eyes - pupils equal and reactive, extraocular eye movements intact Mouth - mucous membranes moist, pharynx normal without lesions Chest - clear to auscultation, no wheezes, rales or rhonchi, symmetric air entry Heart - normal rate, regular rhythm, normal S1, S2, no murmurs, rubs, clicks or gallops Abdomen - soft, nontender, nondistended, no masses or organomegaly Neurological - alert, oriented, normal speech, cranial nerves 2-12 tested and intact, strength 5/5 in extremities x 4, sensation intact- pt is somewhat slow to follow commands but is able to do so Musculoskeletal - no joint tenderness, deformity or swelling Extremities - peripheral pulses normal, no pedal edema, no clubbing or cyanosis Skin - normal coloration and turgor, no rashes  ED Course  Procedures (including critical care time)   Date: 05/18/2011  Rate: 96  Rhythm: paced rhythm  QRS Axis: indeterminate  Intervals: paced rhythm  ST/T Wave abnormalities: indeterminate  Conduction Disutrbances:paced rhythm  Narrative  Interpretation:   Old EKG Reviewed: unchanged    Labs Reviewed  CBC - Abnormal; Notable for the following:    RBC 3.71 (*)    Hemoglobin 11.2 (*)    HCT 34.1 (*)    All other components within normal limits  BASIC METABOLIC  PANEL - Abnormal; Notable for the following:    Potassium 3.4 (*)    Creatinine, Ser 1.24 (*)    GFR calc non Af Amer 39 (*)    GFR calc Af Amer 45 (*)    All other components within normal limits  URINALYSIS, WITH MICROSCOPIC - Abnormal; Notable for the following:    APPearance TURBID (*)    Hgb urine dipstick LARGE (*)    Protein, ur 100 (*)    Leukocytes, UA LARGE (*)    Squamous Epithelial / LPF MANY (*)    All other components within normal limits  MAGNESIUM - Abnormal; Notable for the following:    Magnesium 1.4 (*)    All other components within normal limits  PRO B NATRIURETIC PEPTIDE - Abnormal; Notable for the following:    Pro B Natriuretic peptide (BNP) 1453.0 (*)    All other components within normal limits  PROTIME-INR  APTT  PHOSPHORUS  CARDIAC PANEL(CRET KIN+CKTOT+MB+TROPI)  GLUCOSE, CAPILLARY  URINE CULTURE  TSH  CARDIAC PANEL(CRET KIN+CKTOT+MB+TROPI)  CARDIAC PANEL(CRET KIN+CKTOT+MB+TROPI)  HEMOGLOBIN A1C   X-ray Chest Pa And Lateral   05/18/2011  *RADIOLOGY REPORT*  Clinical Data: Confusion and altered mental status.  CHEST - 2 VIEW  Comparison: 05/07/2011  Findings: Low bilateral lung volumes.  Stable appearance of pacemaker.  No edema or infiltrates.  Stable prominence in the right upper paratracheal mediastinum, likely related to vasculature.  IMPRESSION: Low bilateral lung volumes.  No active disease.  Original Report Authenticated By: Reola Calkins, M.D.   Ct Head Wo Contrast  05/18/2011  *RADIOLOGY REPORT*  Clinical Data: Altered mental status, prior TIAs  CT HEAD WITHOUT CONTRAST  Technique:  Contiguous axial images were obtained from the base of the skull through the vertex without contrast.  Comparison: [place] CT head  dated 05/07/2011  Findings: Motion degraded images.  No evidence of parenchymal hemorrhage or extra-axial fluid collection. No mass lesion, mass effect, or midline shift.  No CT evidence of acute infarction.  Prior hypodensity in the right pons is not apparent on the current study, and was likely artifactual.  Extensive subcortical white matter and periventricular small vessel ischemic changes.  Intracranial atherosclerosis.  Global cortical atrophy out of proportion for age.  Secondary ventriculomegaly.  Mucosal thickening in the left sphenoid sinus.  Visualized paranasal sinuses and mastoid air cells are otherwise clear.  No evidence of calvarial fracture.  IMPRESSION: Motion degraded images.  No evidence of acute intracranial abnormality.  Atrophy and small vessel ischemic changes with intracranial atherosclerosis.  Original Report Authenticated By: Charline Bills, M.D.   5:15 AM d/w [facility], Dr. Lovell Sheehan, she requests temporary admission orders to be written for patient- telemetry bed, [facility] Team 6.    1. Altered mental status   2. Urinary tract infection   3. Renal insufficiency       MDM  Pt presents with c/o brief episode of confusion in speech. This resolved spontaneously and son agrees she is at her baseline now.  Pt has signs of UTI on UA however does have ureteral stent in place- will send a urine culture and cover with rocephin for now due to altered mental status- pt has had normal head CT in ED.  Admitted to [facility] for further managemenet        Ethelda Chick, MD 05/18/11 [phone]

## 2011-05-19 LAB — URINE CULTURE: Colony Count: 75000

## 2011-05-19 LAB — GLUCOSE, CAPILLARY
Glucose-Capillary: 112 mg/dL — ABNORMAL HIGH (ref 70–99)
Glucose-Capillary: 251 mg/dL — ABNORMAL HIGH (ref 70–99)
Glucose-Capillary: 148 mg/dL — ABNORMAL HIGH (ref 70–99)

## 2011-05-19 LAB — CBC
MCV: 91.2 fL (ref 78.0–100.0)
Platelets: 249 10*3/uL (ref 150–400)
RDW: 13.7 % (ref 11.5–15.5)
WBC: 6.5 10*3/uL (ref 4.0–10.5)

## 2011-05-19 LAB — BASIC METABOLIC PANEL WITH GFR
BUN: 12 mg/dL (ref 6–23)
CO2: 22 meq/L (ref 19–32)
Calcium: 8 mg/dL — ABNORMAL LOW (ref 8.4–10.5)
Chloride: 107 meq/L (ref 96–112)
Creatinine, Ser: 1.11 mg/dL — ABNORMAL HIGH (ref 0.50–1.10)
GFR calc Af Amer: 51 mL/min — ABNORMAL LOW
GFR calc non Af Amer: 44 mL/min — ABNORMAL LOW
Glucose, Bld: 207 mg/dL — ABNORMAL HIGH (ref 70–99)
Potassium: 3.6 meq/L (ref 3.5–5.1)
Sodium: 137 meq/L (ref 135–145)

## 2011-05-19 MED ORDER — POTASSIUM CHLORIDE CRYS ER 20 MEQ PO TBCR
40.0000 meq | EXTENDED_RELEASE_TABLET | Freq: Once | ORAL | Status: DC
  Filled 2011-05-19: qty 2

## 2011-05-19 NOTE — Consult Note (Signed)
Urology Consult  Referring physician: [facility] Reason for referral:UTI, stone  History of Present Illness: 78 yo diabetic female, with chronic pan resistant UTI and asymptomatic gross painless hematuria in March 2013. She has a hx of hypotonic bladder, treated with bethanachol in the past. She has had urodynamics showing hypotonic bladder, and a pipestem urethra with very low VLPP. She has used pads and is stable from that standpoint. She has recently had episode of urosepsis from obstructing L upper ureteral stone in a solitary kidney, requiring a JJ stent.    Last urine c/s showed Klebsiella, Resistant to pcn, and Sulfa, but SENSITIVE to Imipenim, Fortax, Cefepime, Gent, Cipro, Levaquin Macrodantin ( 05/01/11).   Now admitted with neurologic changes.   Past Medical History  Diagnosis Date  . Pacemaker   . CAD (coronary artery disease)   . CHB (complete heart block)   . HTN (hypertension), benign   . DM (diabetes mellitus)   . Hypertrophic cardiomyopathy   . Hyperlipidemia   . OP (osteoporosis)   . Hx: UTI (urinary tract infection)   . Fatigue   . Rheumatoid arthritis   . Chronic steroid use   . Confusion   . Pancreatic pseudocyst   . Diverticulosis   . Renal calculus    Past Surgical History  Procedure Date  . Insert / replace / remove pacemaker   . Cardiac catheterization 04/17/2005    EF 65%  . Septal myectomy   . Nephrectomy     RIGHT  . Arm fracture surgery     RIGHT ARM  . Coronary artery bypass graft 04/2005    LIMA GRAFT TO THE DISTAL LAD, SAPHENOUS VEIN GRAFT TO THE DIAGONAL, SAPHENOUS VEIN GRAFT TO THE INTERMEDIATE BRANCH, AND SAPHENOUS VEIN GRAFT TO THE DISTAL RIGHT CORONARY  . Myomectomy   . Cataract extraction, bilateral   . Cystoscopy 05/07/2011    Procedure: CYSTOSCOPY;  Surgeon: Crecencio Mc, MD;  Location: [facility] OR;  Service: Urology;  Laterality: Left;    Medications: I have reviewed the patient's current medications.  Allergies:  Allergies  Allergen Reactions    . Codeine Other (See Comments)    Blacks out    Family History  Problem Relation Age of Onset  . Lung cancer Mother   . Cancer Father   . Heart attack Brother     Social History:  reports that she quit smoking about 21 years ago. Her smoking use included Cigarettes. She smoked 1 pack per day. She has never used smokeless tobacco. She reports that she does not drink alcohol or use illicit drugs.  @ROS @  Physical Exam:  Vital signs in last 24 hours: Temp:  [98.2 F (36.8 C)-98.7 F (37.1 C)] 98.5 F (36.9 C) (04/08 1438) Pulse Rate:  [69-87] 69  (04/08 1438) Resp:  [16-24] 20  (04/08 1438) BP: (146-163)/(74-76) 150/74 mmHg (04/08 1438) SpO2:  [92 %-96 %] 96 % (04/08 1438) @PHYSEXAMBYAGE2 @  Laboratory Data:  Results for orders placed during the hospital encounter of 05/17/11 (from the past 72 hour(s))  URINALYSIS, WITH MICROSCOPIC     Status: Abnormal   Collection Time   05/18/11  1:50 AM      Component Value Range Comment   Color, Urine YELLOW  YELLOW     APPearance TURBID (*) CLEAR     Specific Gravity, Urine 1.012  1.005 - 1.030     pH 6.0  5.0 - 8.0     Glucose, UA NEGATIVE  NEGATIVE (mg/dL)    Hgb urine  dipstick LARGE (*) NEGATIVE     Bilirubin Urine NEGATIVE  NEGATIVE     Ketones, ur NEGATIVE  NEGATIVE (mg/dL)    Protein, ur 161 (*) NEGATIVE (mg/dL)    Urobilinogen, UA 0.2  0.0 - 1.0 (mg/dL)    Nitrite NEGATIVE  NEGATIVE     Leukocytes, UA LARGE (*) NEGATIVE     WBC, UA TOO NUMEROUS TO COUNT  <3 (WBC/hpf)    RBC / HPF FIELD OBSCURED BY WBC'S  <3 (RBC/hpf)    Squamous Epithelial / LPF MANY (*) RARE     Urine-Other MANY YEAST     URINE CULTURE     Status: Normal   Collection Time   05/18/11  1:50 AM      Component Value Range Comment   Specimen Description URINE, CLEAN CATCH      Special Requests NONE      Culture  Setup Time [id number]      Colony Count 75,000 COLONIES/ML      Culture YEAST      Report Status 05/19/2011 FINAL     CBC     Status: Abnormal    Collection Time   05/18/11  2:25 AM      Component Value Range Comment   WBC 10.4  4.0 - 10.5 (K/uL)    RBC 3.71 (*) 3.87 - 5.11 (MIL/uL)    Hemoglobin 11.2 (*) 12.0 - 15.0 (g/dL)    HCT 91.4 (*) 78.2 - 46.0 (%)    MCV 91.9  78.0 - 100.0 (fL)    MCH 30.2  26.0 - 34.0 (pg)    MCHC 32.8  30.0 - 36.0 (g/dL)    RDW 95.6  21.3 - 08.6 (%)    Platelets 253  150 - 400 (K/uL)   BASIC METABOLIC PANEL     Status: Abnormal   Collection Time   05/18/11  2:25 AM      Component Value Range Comment   Sodium 139  135 - 145 (mEq/L)    Potassium 3.4 (*) 3.5 - 5.1 (mEq/L)    Chloride 103  96 - 112 (mEq/L)    CO2 25  19 - 32 (mEq/L)    Glucose, Bld 76  70 - 99 (mg/dL)    BUN 13  6 - 23 (mg/dL)    Creatinine, Ser 5.78 (*) 0.50 - 1.10 (mg/dL)    Calcium 8.9  8.4 - 10.5 (mg/dL)    GFR calc non Af Amer 39 (*) >90 (mL/min)    GFR calc Af Amer 45 (*) >90 (mL/min)   PROTIME-INR     Status: Normal   Collection Time   05/18/11  2:25 AM      Component Value Range Comment   Prothrombin Time 13.5  11.6 - 15.2 (seconds)    INR 1.01  0.00 - 1.49    APTT     Status: Normal   Collection Time   05/18/11  2:25 AM      Component Value Range Comment   aPTT 30  24 - 37 (seconds)   GLUCOSE, CAPILLARY     Status: Normal   Collection Time   05/18/11  8:14 AM      Component Value Range Comment   Glucose-Capillary 79  70 - 99 (mg/dL)   MAGNESIUM     Status: Abnormal   Collection Time   05/18/11  8:18 AM      Component Value Range Comment   Magnesium 1.4 (*) 1.5 - 2.5 (mg/dL)   PHOSPHORUS  Status: Normal   Collection Time   05/18/11  8:18 AM      Component Value Range Comment   Phosphorus 4.0  2.3 - 4.6 (mg/dL)   TSH     Status: Normal   Collection Time   05/18/11  8:18 AM      Component Value Range Comment   TSH 0.478  0.350 - 4.500 (uIU/mL)   PRO B NATRIURETIC PEPTIDE     Status: Abnormal   Collection Time   05/18/11  8:18 AM      Component Value Range Comment   Pro B Natriuretic peptide (BNP) 1453.0 (*) 0 - 450  (pg/mL)   CARDIAC PANEL(CRET KIN+CKTOT+MB+TROPI)     Status: Normal   Collection Time   05/18/11  8:18 AM      Component Value Range Comment   Total CK 25  7 - 177 (U/L)    CK, MB 1.6  0.3 - 4.0 (ng/mL)    Troponin I <0.30  <0.30 (ng/mL)    Relative Index RELATIVE INDEX IS INVALID  0.0 - 2.5    HEMOGLOBIN A1C     Status: Abnormal   Collection Time   05/18/11  8:18 AM      Component Value Range Comment   Hemoglobin A1C 6.8 (*) <5.7 (%)    Mean Plasma Glucose 148 (*) <117 (mg/dL)   GLUCOSE, CAPILLARY     Status: Abnormal   Collection Time   05/18/11 11:53 AM      Component Value Range Comment   Glucose-Capillary 101 (*) 70 - 99 (mg/dL)   CARDIAC PANEL(CRET KIN+CKTOT+MB+TROPI)     Status: Normal   Collection Time   05/18/11  3:08 PM      Component Value Range Comment   Total CK 20  7 - 177 (U/L)    CK, MB 1.7  0.3 - 4.0 (ng/mL)    Troponin I <0.30  <0.30 (ng/mL)    Relative Index RELATIVE INDEX IS INVALID  0.0 - 2.5    GLUCOSE, CAPILLARY     Status: Abnormal   Collection Time   05/18/11  4:18 PM      Component Value Range Comment   Glucose-Capillary 124 (*) 70 - 99 (mg/dL)   GLUCOSE, CAPILLARY     Status: Abnormal   Collection Time   05/18/11  8:23 PM      Component Value Range Comment   Glucose-Capillary 189 (*) 70 - 99 (mg/dL)    Comment 1 Notify RN     CARDIAC PANEL(CRET KIN+CKTOT+MB+TROPI)     Status: Normal   Collection Time   05/18/11 11:20 PM      Component Value Range Comment   Total CK 25  7 - 177 (U/L)    CK, MB 1.5  0.3 - 4.0 (ng/mL)    Troponin I <0.30  <0.30 (ng/mL)    Relative Index RELATIVE INDEX IS INVALID  0.0 - 2.5    BASIC METABOLIC PANEL     Status: Abnormal   Collection Time   05/19/11  4:55 AM      Component Value Range Comment   Sodium 137  135 - 145 (mEq/L)    Potassium 3.6  3.5 - 5.1 (mEq/L)    Chloride 107  96 - 112 (mEq/L)    CO2 22  19 - 32 (mEq/L)    Glucose, Bld 207 (*) 70 - 99 (mg/dL)    BUN 12  6 - 23 (mg/dL)    Creatinine, Ser 4.54 (*) 0.50 -  1.10 (mg/dL)    Calcium 8.0 (*) 8.4 - 10.5 (mg/dL)    GFR calc non Af Amer 44 (*) >90 (mL/min)    GFR calc Af Amer 51 (*) >90 (mL/min)   CBC     Status: Abnormal   Collection Time   05/19/11  4:55 AM      Component Value Range Comment   WBC 6.5  4.0 - 10.5 (K/uL)    RBC 3.30 (*) 3.87 - 5.11 (MIL/uL)    Hemoglobin 10.0 (*) 12.0 - 15.0 (g/dL)    HCT 16.1 (*) 09.6 - 46.0 (%)    MCV 91.2  78.0 - 100.0 (fL)    MCH 30.3  26.0 - 34.0 (pg)    MCHC 33.2  30.0 - 36.0 (g/dL)    RDW 04.5  40.9 - 81.1 (%)    Platelets 249  150 - 400 (K/uL)   GLUCOSE, CAPILLARY     Status: Abnormal   Collection Time   05/19/11  7:54 AM      Component Value Range Comment   Glucose-Capillary 141 (*) 70 - 99 (mg/dL)    Comment 1 Notify RN     GLUCOSE, CAPILLARY     Status: Abnormal   Collection Time   05/19/11 11:49 AM      Component Value Range Comment   Glucose-Capillary 112 (*) 70 - 99 (mg/dL)   GLUCOSE, CAPILLARY     Status: Abnormal   Collection Time   05/19/11  4:59 PM      Component Value Range Comment   Glucose-Capillary 251 (*) 70 - 99 (mg/dL)    Recent Results (from the past 240 hour(s))  URINE CULTURE     Status: Normal   Collection Time   05/18/11  1:50 AM      Component Value Range Status Comment   Specimen Description URINE, CLEAN CATCH   Final    Special Requests NONE   Final    Culture  Setup Time [id number]   Final    Colony Count 75,000 COLONIES/ML   Final    Culture YEAST   Final    Report Status 05/19/2011 FINAL   Final    Creatinine:  Basename 05/19/11 0455 05/18/11 0225  CREATININE 1.11* 1.24*     Impression/Assessment:  1. Solitary kidney with recent hx of Acute Renal Failure 2. Current JJ stent in L ureter for 7mm L upper ureteral stone. We are awaiting stabilization before cysto ureteroscopy and laser of stone. Needs KUB to see if stone can be seen (might then be a lithotripsy candidate). 3, diabetic hypotonic bladder 4. Pipestem urethra 5.Recurrent UTIs  Plan:  Will  follow with you.  Jovanni Rash I 05/19/2011, 5:56 PM

## 2011-05-19 NOTE — Progress Notes (Signed)
Patient ID: [name], female   DOB: Mar 11, 1924, 78 y.o.   MRN: [id number]  Subjective: No events overnight. Patient denies chest pain, shortness of breath, abdominal pain.   Objective:  Vital signs in last 24 hours:  Filed Vitals:   05/18/11 1339 05/18/11 2055 05/19/11 0423 05/19/11 1438  BP: 124/72 146/74 163/76 150/74  Pulse: 82 87 71 69  Temp: 98.7 F (37.1 C) 98.7 F (37.1 C) 98.2 F (36.8 C) 98.5 F (36.9 C)  TempSrc: Oral Oral Oral Oral  Resp: 18 16 24 20   Height:      Weight:      SpO2: 93% 92% 95% 96%   Intake/Output from previous day:  Intake/Output Summary (Last 24 hours) at 05/19/11 1739 Last data filed at 05/19/11 1300  Gross per 24 hour  Intake   2800 ml  Output    454 ml  Net   2346 ml    Physical Exam: General: Alert, awake, in no acute distress. HEENT: No bruits, no goiter. Moist mucous membranes, no scleral icterus, no conjunctival pallor. Heart: Regular rate and rhythm, S1/S2 +, SEM 4/6, rubs, gallops. Lungs: Clear to auscultation bilaterally. No wheezing, no rhonchi, no rales.  Abdomen: Soft, nontender, nondistended, positive bowel sounds. Extremities: No clubbing or cyanosis, no pitting edema,  positive pedal pulses. Neuro: Grossly nonfocal.  Lab Results:  Lab 05/19/11 0455 05/18/11 0225  WBC 6.5 10.4  HGB 10.0* 11.2*  HCT 30.1* 34.1*  PLT 249 253    Lab 05/19/11 0455 05/18/11 0818 05/18/11 0225  NA 137 -- 139  K 3.6 -- 3.4*  CL 107 -- 103  CO2 22 -- 25  GLUCOSE 207* -- 76  BUN 12 -- 13  CREATININE 1.11* -- 1.24*  CALCIUM 8.0* -- 8.9  MG -- 1.4* --    Lab 05/18/11 0225  INR 1.01  PROTIME --   Cardiac markers:  Lab 05/18/11 2320 05/18/11 1508 05/18/11 0818  CKMB 1.5 1.7 1.6  TROPONINI <0.30 <0.30 <0.30  MYOGLOBIN -- -- --   No components found with this basename: POCBNP:3 Recent Results (from the past 240 hour(s))  URINE CULTURE     Status: Normal   Collection Time   05/18/11  1:50 AM      Component Value Range Status  Comment   Specimen Description URINE, CLEAN CATCH   Final    Special Requests NONE   Final    Culture  Setup Time [id number]   Final    Colony Count 75,000 COLONIES/ML   Final    Culture YEAST   Final    Report Status 05/19/2011 FINAL   Final     Studies/Results: X-ray Chest Pa And Lateral   05/18/2011  *RADIOLOGY REPORT*  Clinical Data: Confusion and altered mental status.  CHEST - 2 VIEW  Comparison: 05/07/2011  Findings: Low bilateral lung volumes.  Stable appearance of pacemaker.  No edema or infiltrates.  Stable prominence in the right upper paratracheal mediastinum, likely related to vasculature.  IMPRESSION: Low bilateral lung volumes.  No active disease.  Original Report Authenticated By: Reola Calkins, M.D.   Ct Head Wo Contrast  05/18/2011  *RADIOLOGY REPORT*  Clinical Data: Altered mental status, prior TIAs  CT HEAD WITHOUT CONTRAST  Technique:  Contiguous axial images were obtained from the base of the skull through the vertex without contrast.  Comparison: [place] CT head dated 05/07/2011  Findings: Motion degraded images.  No evidence of parenchymal hemorrhage or extra-axial fluid collection. No mass  lesion, mass effect, or midline shift.  No CT evidence of acute infarction.  Prior hypodensity in the right pons is not apparent on the current study, and was likely artifactual.  Extensive subcortical white matter and periventricular small vessel ischemic changes.  Intracranial atherosclerosis.  Global cortical atrophy out of proportion for age.  Secondary ventriculomegaly.  Mucosal thickening in the left sphenoid sinus.  Visualized paranasal sinuses and mastoid air cells are otherwise clear.  No evidence of calvarial fracture.  IMPRESSION: Motion degraded images.  No evidence of acute intracranial abnormality.  Atrophy and small vessel ischemic changes with intracranial atherosclerosis.  Original Report Authenticated By: Charline Bills, M.D.    Medications: Scheduled Meds:    . acidophilus  1 capsule Oral Daily  . aspirin EC  81 mg Oral Daily  . cefTRIAXone (ROCEPHIN)  IV  1 g Intravenous Q24H  . clopidogrel  75 mg Oral Q breakfast  . enoxaparin  30 mg Subcutaneous Q24H  . famotidine  40 mg Oral BID  . fluconazole  100 mg Oral Daily  . insulin aspart  0-9 Units Subcutaneous TID WC  . latanoprost  1 drop Left Eye QHS  . magnesium sulfate 1 - 4 g bolus IVPB  2 g Intravenous Once  . metoprolol  25 mg Oral BID  . polyvinyl alcohol  1 drop Both Eyes Daily  . predniSONE  5 mg Oral Q breakfast  . raloxifene  60 mg Oral Daily  . simvastatin  10 mg Oral q1800  . sodium chloride  3 mL Intravenous Q12H  . DISCONTD: insulin aspart  0-9 Units Subcutaneous Q4H   Continuous Infusions:   . sodium chloride 75 mL/hr at 05/19/11 1556   PRN Meds:.HYDROcodone-acetaminophen, ondansetron (ZOFRAN) IV, ondansetron  Assessment/Plan:  Altered mental status  - unclear etiology but likely secondary to progressive failure to thrive secondary to dementia, UTI  - continue antibiotics for now as pt responding well to medical therapy - continue to provide supportive care  - supplement electrolytes as indicated  - provide IVF  - PT evaluation in the AM   Hypokalemia  - mild  - within normal limits today  Anemia  - Hg/Hct remain stable and at pt's baseline   Acute on Chronic renal failure  - this is most likely pre renal in etiology  - will provide IVF  - BMP in AM  - urology consult for ? Renal stone and need for surgical intervention  DVT Prophylaxis - Lovenox   Education  - test results and diagnostic studies were discussed with patient and pt's family who was present at the bedside  - patient and family have verbalized the understanding  - questions were answered at the bedside and contact information was provided for additional questions or concerns     LOS: 2 days   MAGICK-Hazelene Doten 05/19/2011, 5:39 PM  [facility] Pager: [phone]

## 2011-05-19 NOTE — Evaluation (Signed)
Physical Therapy Evaluation Patient Details Name: MARIN WISNER MRN: [id number] DOB: Jun 29, 1924 Today's Date: 05/19/2011  Problem List:  Patient Active Problem List  Diagnoses  . Pacemaker  . CAD (coronary artery disease)  . CHB (complete heart block)  . HTN (hypertension), benign  . DM (diabetes mellitus)  . Hypertrophic cardiomyopathy  . Hyperlipidemia  . Acute renal failure (ARF)  . Ureterolithiasis  . Hydronephrosis  . Hyperkalemia  . Hypoglycemia  . Metabolic acidemia  . DM type 2 (diabetes mellitus, type 2)    Past Medical History:  Past Medical History  Diagnosis Date  . Pacemaker   . CAD (coronary artery disease)   . CHB (complete heart block)   . HTN (hypertension), benign   . DM (diabetes mellitus)   . Hypertrophic cardiomyopathy   . Hyperlipidemia   . OP (osteoporosis)   . Hx: UTI (urinary tract infection)   . Fatigue   . Rheumatoid arthritis   . Chronic steroid use   . Confusion   . Pancreatic pseudocyst   . Diverticulosis   . Renal calculus    Past Surgical History:  Past Surgical History  Procedure Date  . Insert / replace / remove pacemaker   . Cardiac catheterization 04/17/2005    EF 65%  . Septal myectomy   . Nephrectomy     RIGHT  . Arm fracture surgery     RIGHT ARM  . Coronary artery bypass graft 04/2005    LIMA GRAFT TO THE DISTAL LAD, SAPHENOUS VEIN GRAFT TO THE DIAGONAL, SAPHENOUS VEIN GRAFT TO THE INTERMEDIATE BRANCH, AND SAPHENOUS VEIN GRAFT TO THE DISTAL RIGHT CORONARY  . Myomectomy   . Cataract extraction, bilateral   . Cystoscopy 05/07/2011    Procedure: CYSTOSCOPY;  Surgeon: Crecencio Mc, MD;  Location: [facility] OR;  Service: Urology;  Laterality: Left;    PT Assessment/Plan/Recommendation PT Assessment Clinical Impression Statement: Pt presents with altered mental status, UTI and renal insufficiency with decreased mobility and strength.  Pt tolerated ambulation well, however did c/o some fatigue when returning to room.  Noted some  instability with gait when using RW.  Pt will benefit from skilled PT in acute venue to address deficits.  PT recommends HHPT for follow up at D/C due to pt's son will be able to provide mostly 24/7 assist initially.  PT Recommendation/Assessment: Patient will need skilled PT in the acute care venue PT Problem List: Decreased strength;Decreased range of motion;Decreased activity tolerance;Decreased balance;Decreased mobility;Decreased knowledge of use of DME;Decreased safety awareness Barriers to Discharge: None PT Therapy Diagnosis : Difficulty walking;Abnormality of gait;Generalized weakness PT Plan PT Frequency: Min 3X/week PT Treatment/Interventions: DME instruction;Gait training;Functional mobility training;Therapeutic activities;Therapeutic exercise;Balance training;Patient/family education PT Recommendation Recommendations for Other Services: Other (comment) (OT already on board) Follow Up Recommendations: Home health PT Equipment Recommended: None recommended by PT PT Goals  Acute Rehab PT Goals PT Goal Formulation: With patient/family Time For Goal Achievement: 2 weeks Pt will go Supine/Side to Sit: with modified independence PT Goal: Supine/Side to Sit - Progress: Goal set today Pt will go Sit to Supine/Side: with modified independence PT Goal: Sit to Supine/Side - Progress: Goal set today Pt will go Sit to Stand: with modified independence PT Goal: Sit to Stand - Progress: Goal set today Pt will go Stand to Sit: with modified independence PT Goal: Stand to Sit - Progress: Goal set today Pt will Ambulate: >150 feet;with modified independence;with least restrictive assistive device PT Goal: Ambulate - Progress: Goal set today Pt will Perform  Home Exercise Program: with supervision, verbal cues required/provided PT Goal: Perform Home Exercise Program - Progress: Goal set today  PT Evaluation Precautions/Restrictions  Precautions Precautions: Fall Required Braces or Orthoses:  No Restrictions Weight Bearing Restrictions: No Prior Functioning  Home Living Lives With: Sheran Spine Help From: Family Type of Home: House Home Layout: One level Home Access: Level entry Bathroom Shower/Tub: Engineer, manufacturing systems: Standard Bathroom Accessibility: Yes How Accessible: Accessible via walker;Accessible via wheelchair Home Adaptive Equipment: Bedside commode/3-in-1;Walker - rolling;Walker - four wheeled Additional Comments: raised toilet seat with rails, built in shower seat.  Prior Function Comments: Pt is supervision for bathing, intermittent assist with dressing (bra).  Cognition Cognition Arousal/Alertness: Awake/alert Overall Cognitive Status: History of cognitive impairments History of Cognitive Impairment: Appears at baseline functioning Orientation Level: Oriented to person;Oriented to time;Oriented to situation Sensation/Coordination Sensation Light Touch: Appears Intact Coordination Gross Motor Movements are Fluid and Coordinated: Yes Extremity Assessment RLE Assessment RLE Assessment: Exceptions to [facility] RLE Strength RLE Overall Strength Comments: ankle motions 4/5, knee ext 3+/5, hip flex 3/5 LLE Assessment LLE Assessment: Exceptions to [facility] LLE Strength LLE Overall Strength Comments: ankle motions 4/5, knee flex 3/5, hip flex 3/5 Mobility (including Balance) Bed Mobility Bed Mobility: Yes Supine to Sit: 4: Min assist;HOB elevated (Comment degrees) Supine to Sit Details (indicate cue type and reason): Requires some assist for LE initiation off of bed with some steadying assist for trunk into sitting on EOB.  cues for technique.  Transfers Transfers: Yes Sit to Stand: 4: Min assist Sit to Stand Details (indicate cue type and reason): Cues for hand placement on bed when standing.  some steadying assist due to imbalance. Performed x 2 from bed and 3in1.   Ambulation/Gait Ambulation/Gait Assistance: 4: Min assist Ambulation/Gait Assistance  Details (indicate cue type and reason): Cues for hand placement on arm rests when standing.  Performed x 2 to 3in1 and to recliner.  Ambulation Distance (Feet): 200 Feet Assistive device: Rolling walker Gait Pattern: Step-through pattern;Decreased stride length;Trunk flexed Stairs: No Wheelchair Mobility Wheelchair Mobility: No    Exercise    End of Session PT - End of Session Equipment Utilized During Treatment: Gait belt Activity Tolerance: Patient tolerated treatment well Patient left: in chair;with call bell in reach;with family/visitor present Nurse Communication: Mobility status for transfers;Mobility status for ambulation General Behavior During Session: [facility] for tasks performed Cognition: [facility] for tasks performed  Page, Meribeth Mattes 05/19/2011, 10:05 AM

## 2011-05-19 NOTE — Evaluation (Signed)
Occupational Therapy Evaluation Patient Details Name: [name] MRN: [id number] DOB: 1925-01-27 Today's Date: 05/19/2011  Problem List:  Patient Active Problem List  Diagnoses  . Pacemaker  . CAD (coronary artery disease)  . CHB (complete heart block)  . HTN (hypertension), benign  . DM (diabetes mellitus)  . Hypertrophic cardiomyopathy  . Hyperlipidemia  . Acute renal failure (ARF)  . Ureterolithiasis  . Hydronephrosis  . Hyperkalemia  . Hypoglycemia  . Metabolic acidemia  . DM type 2 (diabetes mellitus, type 2)    Past Medical History:  Past Medical History  Diagnosis Date  . Pacemaker   . CAD (coronary artery disease)   . CHB (complete heart block)   . HTN (hypertension), benign   . DM (diabetes mellitus)   . Hypertrophic cardiomyopathy   . Hyperlipidemia   . OP (osteoporosis)   . Hx: UTI (urinary tract infection)   . Fatigue   . Rheumatoid arthritis   . Chronic steroid use   . Confusion   . Pancreatic pseudocyst   . Diverticulosis   . Renal calculus    Past Surgical History:  Past Surgical History  Procedure Date  . Insert / replace / remove pacemaker   . Cardiac catheterization 04/17/2005    EF 65%  . Septal myectomy   . Nephrectomy     RIGHT  . Arm fracture surgery     RIGHT ARM  . Coronary artery bypass graft 04/2005    LIMA GRAFT TO THE DISTAL LAD, SAPHENOUS VEIN GRAFT TO THE DIAGONAL, SAPHENOUS VEIN GRAFT TO THE INTERMEDIATE BRANCH, AND SAPHENOUS VEIN GRAFT TO THE DISTAL RIGHT CORONARY  . Myomectomy   . Cataract extraction, bilateral   . Cystoscopy 05/07/2011    Procedure: CYSTOSCOPY;  Surgeon: Crecencio Mc, MD;  Location: [facility] OR;  Service: Urology;  Laterality: Left;    OT Assessment/Plan/Recommendation OT Assessment Clinical Impression Statement: Pt presents with c/o brief episode tonight of changes in her speech. Her son states that for approx 5 minutes she was talking but her words were mixed up and not making sense. He states there was some  slurring of the words. Pt displays decreased strength, decreased balance and safety and would benefit from skilled OT services to increase her independence with these self care tasks.  OT Recommendation/Assessment: Patient will need skilled OT in the acute care venue OT Problem List: Decreased strength;Decreased knowledge of use of DME or AE OT Therapy Diagnosis : Generalized weakness OT Plan OT Frequency: Min 2X/week OT Treatment/Interventions: Self-care/ADL training;Therapeutic activities;DME and/or AE instruction;Patient/family education OT Recommendation Follow Up Recommendations: Home health OT;Supervision/Assistance - 24 hour; HH aide if possible.  Equipment Recommended: None recommended by OT Individuals Consulted Consulted and Agree with Results and Recommendations: Patient;Family member/caregiver Family Member Consulted: son OT Goals Acute Rehab OT Goals OT Goal Formulation: With patient/family ADL Goals Pt Will Perform Grooming: with supervision;Standing at sink ADL Goal: Grooming - Progress: Goal set today Pt Will Perform Lower Body Bathing: with supervision;Sit to stand from chair;Sit to stand from bed ADL Goal: Lower Body Bathing - Progress: Goal set today Pt Will Perform Lower Body Dressing: with supervision;Sit to stand from chair;Sit to stand from bed ADL Goal: Lower Body Dressing - Progress: Goal set today Pt Will Transfer to Toilet: with supervision;Comfort height toilet;Grab bars;Ambulation ADL Goal: Toilet Transfer - Progress: Goal set today Pt Will Perform Toileting - Clothing Manipulation: with supervision;Standing ADL Goal: Toileting - Clothing Manipulation - Progress: Goal set today Pt Will Perform Toileting - Hygiene:  with supervision;Sit to stand from 3-in-1/toilet ADL Goal: Toileting - Hygiene - Progress: Goal set today  OT Evaluation Precautions/Restrictions  Precautions Precautions: Fall Required Braces or Orthoses: No Restrictions Weight Bearing  Restrictions: No Prior Functioning Home Living Lives With: Sheran Spine Help From: Family Type of Home: House Home Layout: One level Home Access: Level entry Bathroom Shower/Tub: Engineer, manufacturing systems: Handicapped height (grab bar) Bathroom Accessibility: Yes How Accessible: Accessible via walker;Accessible via wheelchair Home Adaptive Equipment: Bedside commode/3-in-1;Walker - rolling;Walker - four wheeled Additional Comments: raised toilet seat with rails, built in shower seat.  Prior Function Level of Independence: Needs assistance with homemaking;Independent with gait Comments: Pt is supervision for bathing, intermittent assist with dressing (bra).  ADL ADL Eating/Feeding: Simulated;Independent Where Assessed - Eating/Feeding: Bed level Grooming: Simulated;Wash/dry hands;Set up;Supervision/safety Where Assessed - Grooming: Unsupported;Sitting, bed Upper Body Bathing: Simulated;Chest;Right arm;Left arm;Abdomen;Supervision/safety;Set up Where Assessed - Upper Body Bathing: Sitting, bed;Unsupported Lower Body Bathing: Simulated;Minimal assistance Where Assessed - Lower Body Bathing: Sit to stand from bed Upper Body Dressing: Simulated;Minimal assistance Where Assessed - Upper Body Dressing: Sitting, bed;Unsupported Lower Body Dressing: Simulated;Minimal assistance Where Assessed - Lower Body Dressing: Sit to stand from bed Toilet Transfer: Performed;Minimal assistance Toilet Transfer Details (indicate cue type and reason): with RW with mod verbal cues for hand placement and safety with RW Toilet Transfer Method: Ambulating Toilet Transfer Equipment: Comfort height toilet;Grab bars Toileting - Clothing Manipulation: Simulated;Minimal assistance Where Assessed - Glass blower/designer Manipulation: Standing Toileting - Hygiene: Performed;Supervision/safety Where Assessed - Toileting Hygiene: Sit on 3-in-1 or toilet Tub/Shower Transfer: Not assessed Tub/Shower Transfer  Method: Not assessed Equipment Used: Rolling walker ADL Comments: Son present for session. Recommended to him to have 24/7 initial assist for patient at discharge. Pt needs min assist for most all of her ADL and verbal cues throughout session for safety with hand placement, backing up to chair with RW completely.  Vision/Perception    Cognition Cognition Arousal/Alertness: Awake/alert Overall Cognitive Status: History of cognitive impairments History of Cognitive Impairment: Appears at baseline functioning Orientation Level: Oriented to person;Oriented to time;Oriented to situation;Oriented to place Cognition - Other Comments: states hospital but not able to name which one Sensation/Coordination Sensation Light Touch: Appears Intact Coordination Gross Motor Movements are Fluid and Coordinated: Yes Extremity Assessment RUE Assessment RUE Assessment: Within Functional Limits LUE Assessment LUE Assessment: Within Functional Limits Mobility  Bed Mobility Bed Mobility: Yes Supine to Sit: 4: Min assist;HOB elevated (Comment degrees) Supine to Sit Details (indicate cue type and reason): Requires some assist for LE initiation off of bed with some steadying assist for trunk into sitting on EOB.  cues for technique.  Transfers Sit to Stand: 4: Min assist Sit to Stand Details (indicate cue type and reason): Cues for hand placement on bed when standing.  some steadying assist due to imbalance. Performed x 2 from bed and 3in1.   Exercises   End of Session OT - End of Session Equipment Utilized During Treatment: Gait belt Activity Tolerance: Patient tolerated treatment well Patient left: in chair;with call bell in reach;with family/visitor present General Behavior During Session: [facility] for tasks performed Cognition: Impaired, at baseline   Lennox Laity [phone] 05/19/2011, 11:09 AM

## 2011-05-20 ENCOUNTER — Inpatient Hospital Stay (HOSPITAL_COMMUNITY): Payer: Medicare Other

## 2011-05-20 LAB — CBC
MCH: 29.7 pg (ref 26.0–34.0)
MCHC: 32.5 g/dL (ref 30.0–36.0)
Platelets: 273 10*3/uL (ref 150–400)
RDW: 13.8 % (ref 11.5–15.5)

## 2011-05-20 LAB — BASIC METABOLIC PANEL
Calcium: 8.3 mg/dL — ABNORMAL LOW (ref 8.4–10.5)
GFR calc Af Amer: 54 mL/min — ABNORMAL LOW (ref >90)
GFR calc non Af Amer: 46 mL/min — ABNORMAL LOW (ref >90)
Potassium: 3.3 mEq/L — ABNORMAL LOW (ref 3.5–5.1)
Sodium: 140 mEq/L (ref 135–145)

## 2011-05-20 MED ORDER — POTASSIUM CHLORIDE CRYS ER 20 MEQ PO TBCR
40.0000 meq | EXTENDED_RELEASE_TABLET | Freq: Every day | ORAL | Status: DC
  Administered 2011-05-20: 40 meq via ORAL
  Filled 2011-05-20: qty 2

## 2011-05-20 MED ORDER — CIPROFLOXACIN HCL 500 MG PO TABS: 500.0000 mg | ORAL_TABLET | Freq: Two times a day (BID) | ORAL | Status: AC

## 2011-05-20 MED ORDER — FLUCONAZOLE 100 MG PO TABS: 100.0000 mg | ORAL_TABLET | Freq: Every day | ORAL | Status: AC

## 2011-05-20 MED ORDER — ENOXAPARIN SODIUM 40 MG/0.4ML ~~LOC~~ SOLN
40.0000 mg | SUBCUTANEOUS | Status: DC
  Administered 2011-05-20: 40 mg via SUBCUTANEOUS
  Filled 2011-05-20: qty 0.4

## 2011-05-20 NOTE — Progress Notes (Signed)
  Subjective: Patient reports 1. Confusion 2. L upper ureteral stone with hydronephrosis and post L JJ stent  Objective: Vital signs in last 24 hours: Temp:  [97.7 F (36.5 C)-98.5 F (36.9 C)] 97.7 F (36.5 C) (04/09 0431) Pulse Rate:  [69-85] 85  (04/09 0431) Resp:  [20] 20  (04/09 0431) BP: (150-182)/(74-79) 182/76 mmHg (04/09 0431) SpO2:  [95 %-98 %] 98 % (04/09 0431)A  Intake/Output from previous day: 04/08 0701 - 04/09 0700 In: 3170 [P.O.:1320; I.V.:1800; IV Piggyback:50] Out: 200 [Urine:200] Intake/Output this shift:    Past Medical History  Diagnosis Date  . Pacemaker   . CAD (coronary artery disease)   . CHB (complete heart block)   . HTN (hypertension), benign   . DM (diabetes mellitus)   . Hypertrophic cardiomyopathy   . Hyperlipidemia   . OP (osteoporosis)   . Hx: UTI (urinary tract infection)   . Fatigue   . Rheumatoid arthritis   . Chronic steroid use   . Confusion   . Pancreatic pseudocyst   . Diverticulosis   . Renal calculus     Physical Exam:  General: pleasantly confused.  Lungs - Normal respiratory effort, chest expands symmetrically.  Abdomen - Soft, non-tender & non-distended.  Lab Results:  Basename 05/20/11 0430 05/19/11 0455 05/18/11 0225  WBC 9.0 6.5 10.4  HGB 10.5* 10.0* 11.2*  HCT 32.3* 30.1* 34.1*   BMET  Basename 05/20/11 0430 05/19/11 0455  NA 140 137  K 3.3* 3.6  CL 108 107  CO2 22 22  GLUCOSE 119* 207*  BUN 12 12  CREATININE 1.07 1.11*  CALCIUM 8.3* 8.0*   No results found for this basename: LABURIN:1 in the last 72 hours Results for orders placed during the hospital encounter of 05/17/11  URINE CULTURE     Status: Normal   Collection Time   05/18/11  1:50 AM      Component Value Range Status Comment   Specimen Description URINE, CLEAN CATCH   Final    Special Requests NONE   Final    Culture  Setup Time [id number]   Final    Colony Count 75,000 COLONIES/ML   Final    Culture YEAST   Final    Report Status  05/19/2011 FINAL   Final     Studies/Results: @RISRSLT24 @  Assessment/Plan: JJ stent is in place. Awaiting KUB results to see stonbe and position of JJ stent. Will ck xray today.  Spoke with medical team. Plan will be for antibiotics and resolution of confusion and metabolic stability. She will RTC for surgical scheduling.   Prayan Ulin I 05/20/2011, 8:40 AM

## 2011-05-20 NOTE — Progress Notes (Signed)
Talked to patient with son present about DCP; Physical therapy is recommending [facility]- PT/ OT; patient does not want this at this time. Abelino Derrick RN, BSN, [employer]

## 2011-05-20 NOTE — Progress Notes (Signed)
PT/OT/ST Cancellation Note  ___Treatment cancelled today due to medical issues with patient which prohibited therapy  ___ Treatment cancelled today due to patient receiving procedure or test   _x__ Treatment cancelled today due to patient's refusal to participate. Son present and states plan is for d/c home this afternoon.   ___ Treatment cancelled today due to   Signature:    Rebeca Alert, PT [phone]

## 2011-05-20 NOTE — Discharge Instructions (Signed)
Altered Mental Status  Altered mental status most often refers to an abnormal change in your responsiveness and awareness. It can affect your speech, thought, mobility, memory, attention span, or alertness. It can range from slight confusion to complete unresponsiveness (coma). Altered mental status can be a sign of a serious underlying medical condition. Rapid evaluation and medical treatment is necessary for patients having an altered mental status.  CAUSES    Low blood sugar (hypoglycemia) or diabetes.   Severe loss of body fluids (dehydration) or a body salt (electrolyte) imbalance.   A stroke or other neurologic problem, such as dementia or delirium.   A head injury or tumor.   A drug or alcohol overdose.   Exposure to toxins or poisons.   Depression, anxiety, and stress.   A low oxygen level (hypoxia).   An infection.   Blood loss.   Twitching or shaking (seizure).   Heart problems, such as heart attack or heart rhythm problems (arrhythmias).   A body temperature that is too low or too high (hypothermia or hyperthermia).  DIAGNOSIS   A diagnosis is based on your history, symptoms, physical and neurologic examinations, and diagnostic tests. Diagnostic tests may include:   Measurement of your blood pressure, pulse, breathing, and oxygen levels (vital signs).   Blood tests.   Urine tests.   X-ray exams.   A computerized magnetic scan (magnetic resonance imaging, MRI).   A computerized X-ray scan (computed tomography, CT scan).  TREATMENT   Treatment will depend on the cause. Treatment may include:   Management of an underlying medical or mental health condition.   Critical care or support in the hospital.  HOME CARE INSTRUCTIONS    Only take over-the-counter or prescription medicines for pain, discomfort, or fever as directed by your caregiver.   Manage underlying conditions as directed by your caregiver.   Eat a healthy, well-balanced diet to maintain strength.   Join a support group or  prevention program to cope with the condition or trauma that caused the altered mental status. Ask your caregiver to help choose a program that works for you.   Follow up with your caregiver for further examination, therapy, or testing as directed.  SEEK MEDICAL CARE IF:    You feel unwell or have chills.   You or your family notice a change in your behavior or your alertness.   You have trouble following your caregiver's treatment plan.   You have questions or concerns.  SEEK IMMEDIATE MEDICAL CARE IF:    You have a rapid heartbeat or have chest pain.   You have difficulty breathing.   You have a fever.   You have a headache with a stiff neck.   You cough up blood.   You have blood in your urine or stool.   You have severe agitation or confusion.  MAKE SURE YOU:    Understand these instructions.   Will watch your condition.   Will get help right away if you are not doing well or get worse.  Document Released: 07/17/2009 Document Revised: 01/16/2011 Document Reviewed: 07/17/2009  ExitCare Patient Information 2012 [employer]  Urinary Tract Infection  Infections of the urinary tract can start in several places. A bladder infection (cystitis), a kidney infection (pyelonephritis), and a prostate infection (prostatitis) are different types of urinary tract infections (UTIs). They usually get better if treated with medicines (antibiotics) that kill germs. Take all the medicine until it is gone. You or your child may feel better in   a few days, but TAKE ALL MEDICINE or the infection may not respond and may become more difficult to treat.  HOME CARE INSTRUCTIONS    Drink enough water and fluids to keep the urine clear or pale yellow. Cranberry juice is especially recommended, in addition to large amounts of water.   Avoid caffeine, tea, and carbonated beverages. They tend to irritate the bladder.   Alcohol may irritate the prostate.   Only take over-the-counter or prescription medicines for pain,  discomfort, or fever as directed by your caregiver.  To prevent further infections:   Empty the bladder often. Avoid holding urine for long periods of time.   After a bowel movement, women should cleanse from front to back. Use each tissue only once.   Empty the bladder before and after sexual intercourse.  FINDING OUT THE RESULTS OF YOUR TEST  Not all test results are available during your visit. If your or your child's test results are not back during the visit, make an appointment with your caregiver to find out the results. Do not assume everything is normal if you have not heard from your caregiver or the medical facility. It is important for you to follow up on all test results.  SEEK MEDICAL CARE IF:    There is back pain.   Your baby is older than 3 months with a rectal temperature of 100.5 F (38.1 C) or higher for more than 1 day.   Your or your child's problems (symptoms) are no better in 3 days. Return sooner if you or your child is getting worse.  SEEK IMMEDIATE MEDICAL CARE IF:    There is severe back pain or lower abdominal pain.   You or your child develops chills.   You have a fever.   Your baby is older than 3 months with a rectal temperature of 102 F (38.9 C) or higher.   Your baby is 3 months old or younger with a rectal temperature of 100.4 F (38 C) or higher.   There is nausea or vomiting.   There is continued burning or discomfort with urination.  MAKE SURE YOU:    Understand these instructions.   Will watch your condition.   Will get help right away if you are not doing well or get worse.  Document Released: 11/06/2004 Document Revised: 01/16/2011 Document Reviewed: 06/11/2006  [employer] Patient Information 2012 [employer]

## 2011-05-20 NOTE — Discharge Summary (Signed)
Patient ID: [name] MRN: [id number] DOB/AGE: 1924/06/10 78 y.o.  Admit date: 05/17/2011 Discharge date: 05/20/2011  Primary Care Physician:  Kaleen Mask, MD, MD  Discharge Diagnoses:  Altered mental status secondary to UTI  Present on Admission:  Altered mental status UTI CAD Acute on chronic renal failure  Medication List  As of 05/20/2011 11:15 AM   STOP taking these medications         cephALEXin 250 MG capsule         TAKE these medications         ACIDOPHILUS PO   Take 460 mg by mouth daily.      amitriptyline-chlordiazePOXIDE 12.5-5 MG per tablet   Commonly known as: LIMBITROL   Take 1 tablet by mouth at bedtime.      aspirin EC 81 MG tablet   Take 81 mg by mouth daily.      CALCIUM 500 PO   Take 1 tablet by mouth daily.      ciprofloxacin 500 MG tablet   Commonly known as: CIPRO   Take 1 tablet (500 mg total) by mouth 2 (two) times daily.      clopidogrel 75 MG tablet   Commonly known as: PLAVIX   Take 75 mg by mouth daily.      estradiol 2 MG vaginal ring   Commonly known as: ESTRING   Place 2 mg vaginally every 3 (three) months. follow package directions      fluconazole 100 MG tablet   Commonly known as: DIFLUCAN   Take 1 tablet (100 mg total) by mouth daily.      glimepiride 4 MG tablet   Commonly known as: AMARYL   Take 2 mg by mouth daily before breakfast.      HYDROcodone-acetaminophen 5-500 MG per tablet   Commonly known as: VICODIN   Take 1 tablet by mouth every 6 (six) hours as needed. For pain.      ICAPS PO   Take 1 capsule by mouth daily.      latanoprost 0.005 % ophthalmic solution   Commonly known as: XALATAN   Place 1 drop into the left eye daily.      lovastatin 20 MG tablet   Commonly known as: MEVACOR   Take 20 mg by mouth every evening.      metFORMIN 1000 MG tablet   Commonly known as: GLUCOPHAGE   Take 1,000 mg by mouth 2 (two) times daily.      metoprolol 50 MG tablet   Commonly known as: LOPRESSOR     Take 25 mg by mouth 2 (two) times daily.      mulitivitamin with minerals Tabs   Take 1 tablet by mouth daily.      niacin 500 MG tablet   Take 500 mg by mouth 2 (two) times daily.      nitroGLYCERIN 0.4 MG SL tablet   Commonly known as: NITROSTAT   Place 0.4 mg under the tongue every 5 (five) minutes as needed. For chest pain.      predniSONE 5 MG tablet   Commonly known as: DELTASONE   Take 5 mg by mouth daily.      PRESCRIPTION MEDICATION   See admin instructions. Antibiotic possibly ciprofloxacin will call pharmacy when open to clarify      raloxifene 60 MG tablet   Commonly known as: EVISTA   Take 60 mg by mouth daily.      ranitidine 300 MG tablet   Commonly known  as: ZANTAC   Take 150 mg by mouth 2 (two) times daily.      SYSTANE OP   Apply 1-2 drops to eye as needed. For eye irritation      Vitamin D (Ergocalciferol) 50000 UNITS Caps   Commonly known as: DRISDOL   Take 50,000 Units by mouth every 14 (fourteen) days.            Disposition and Follow-up: Pt will need to see PCP in 4-5 weeks post discharge and will need to see her primary urologist in next two weeks for further evaluation and management of left kidney stone.  Consults: urology  Significant Diagnostic Studies:   X-ray Chest Pa And Lateral  05/18/2011  MPRESSION: Low bilateral lung volumes.  No active disease.    Ct Head Wo Contrast 05/18/2011   IMPRESSION: Motion degraded images.  No evidence of acute intracranial abnormality.  Atrophy and small vessel ischemic changes with intracranial atherosclerosis.    Abdominal XRAY:  05/20/2011 IMPRESSION: Previously seen left ureteral stone cannot be definitively visualized on plain film. Left ureteral stent in place with the proximal aspect likely in the proximal left ureter.   Brief H and P: Pt presents with c/o brief episode of changes in her speech. Her son states that for approx 5 minutes she was talking but her words were mixed up and not making  sense. He states there was some slurring of the words. She states she remembers feeling that she couldn't get her words out correctly. She has a hx of dementia- son states that usually she is able to carry on a normal conversation. She denies any changes in vision, no extremity weakness, no headache. Denies any specific aggravating or alleviating factors.   Physical Exam on Discharge:  Filed Vitals:   05/19/11 0423 05/19/11 1438 05/19/11 2012 05/20/11 0431  BP: 163/76 150/74 172/79 182/76  Pulse: 71 69 84 85  Temp: 98.2 F (36.8 C) 98.5 F (36.9 C) 98.2 F (36.8 C) 97.7 F (36.5 C)  TempSrc: Oral Oral Oral Oral  Resp: 24 20 20 20   Height:      Weight:      SpO2: 95% 96% 95% 98%    Intake/Output Summary (Last 24 hours) at 05/20/11 1115 Last data filed at 05/20/11 0700  Gross per 24 hour  Intake   3050 ml  Output    200 ml  Net   2850 ml    General: Alert, awake, oriented to name only, not in acute distress HEENT: No bruits, no goiter. Heart: Regular rate and rhythm, SEM 4/6, no rubs, no gallops Lungs: Clear to auscultation bilaterally with minimal bibasilar crackles Abdomen: Soft, nontender, nondistended, positive bowel sounds. Extremities: No clubbing cyanosis or edema with positive pedal pulses. Neuro: Grossly intact, nonfocal.Pt ambulating with no assistance. Able to get up and out of the chair alone with no assistance.   CBC:    Component Value Date/Time   WBC 9.0 05/20/2011 0430   HGB 10.5* 05/20/2011 0430   HCT 32.3* 05/20/2011 0430   PLT 273 05/20/2011 0430   MCV 91.5 05/20/2011 0430   NEUTROABS 12.6* 05/07/2011 1055   LYMPHSABS 0.6* 05/07/2011 1055   MONOABS 1.8* 05/07/2011 1055   EOSABS 0.0 05/07/2011 1055   BASOSABS 0.0 05/07/2011 1055    Basic Metabolic Panel:    Component Value Date/Time   NA 140 05/20/2011 0430   K 3.3* 05/20/2011 0430   CL 108 05/20/2011 0430   CO2 22 05/20/2011 0430  BUN 12 05/20/2011 0430   CREATININE 1.07 05/20/2011 0430   GLUCOSE 119* 05/20/2011 0430     CALCIUM 8.3* 05/20/2011 0430    Hospital Course:  Altered mental status  - multifactorial and likely secondary to progressive failure to thrive secondary to dementia, UTI, ? TIA - we have started the pt on antibiotics and she has responded well - she will continue to take these abx for 5 additional days - IVF were provided and pt has responded well - she is unable to tell where she is or what the date is, but is able to follow commands appropriately and answers questions appropriately - in addition, she has not had any focal neurologic findings to be suggestive of stroke  Hypokalemia  - mild  - supplemented  Anemia  - Hg/Hct remained stable and at pt's baseline during the hospitalization  Acute on Chronic renal failure  - this was most likely pre renal in etiology  - we provided IVF and pt has responded well - urology consult for ? Renal stone and need for surgical intervention  - pt will be evaluated further by her primary urologist for further evaluation and management of renal stone  DVT Prophylaxis - Lovenox   Education  - test results and diagnostic studies were discussed with patient and pt's family who was present at the bedside  - patient and family have verbalized the understanding  - questions were answered at the bedside and contact information was provided for additional questions or concerns   Time spent on Discharge: Over 30 minutes  Signed: Debbora Presto 05/20/2011, 11:15 AM  [facility], pager #: [phone] Main office number: [phone]

## 2011-05-20 NOTE — Progress Notes (Signed)
Occupational Therapy Treatment Patient Details Name: [name] MRN: [id number] DOB: 19-Dec-1924 Today's Date: 05/20/2011  OT Assessment/Plan OT Assessment/Plan Comments on Treatment Session: Pt improving with her balance with ADl but is still alittle unsteady. Did not use assistive device this session as son states pt getting up some last night and today with help but not using device. Reinforced multiple times need for pt to have initial 24/7 supervision for safety as she is confused and alittle unsteady. He verbalizes understanding.  OT Plan: Discharge plan remains appropriate OT Frequency: Min 2X/week Follow Up Recommendations: Home health OT and 24/7 initially until confusion hopefully resolves. Son aware.  Equipment Recommended: None recommended by OT OT Goals ADL Goals ADL Goal: Toilet Transfer - Progress: Progressing toward goals ADL Goal: Toileting - Clothing Manipulation - Progress: Progressing toward goals  Tub goal added today 05/20/11 Pt Will Perform Tub/Shower Transfer: Tub transfer;with supervision;Grab bars ADL Goal: Tub/Shower Transfer - Progress: Progressing toward goals  OT Treatment Precautions/Restrictions  Precautions Precautions: Fall Restrictions Weight Bearing Restrictions: No   ADL ADL Toilet Transfer: Performed;Minimal assistance Toilet Transfer Details (indicate cue type and reason): min to min guard without assistive device. Pt's son states she has been ambulating without device last night and today. She did not use an assistive devicer PTA. Pt at times min assist for safety especially around obstacles as she is slightly unsteady. Min verbal cues for hand placement.  Toilet Transfer Method: Proofreader: Comfort height toilet;Grab bars Toileting - Clothing Manipulation: Simulated;Minimal assistance Toileting - Clothing Manipulation Details (indicate cue type and reason): min guard assist.  Where Assessed - Toileting Clothing  Manipulation: Standing Tub/Shower Transfer: Simulated;Minimal assistance ADL Comments: MD in to see pt prior to OT session and spoke to her. She states that pt may discharge this afternoon. pt very confused asking if her neighbors are able to pay their rent, etc. Set goal for tub transfer as pt to discharge home soon and son states she will want to do a shower.  Mobility  Transfers Transfers: Yes Sit to Stand: 4: Min assist;From chair/3-in-1;With upper extremity assist;From toilet Sit to Stand Details (indicate cue type and reason): min guard assist and use of grab bar in bathroom.  Stand to Sit: 4: Min assist;To chair/3-in-1;With upper extremity assist Stand to Sit Details: min guard assist  Exercises    End of Session OT - End of Session Equipment Utilized During Treatment: Gait belt Activity Tolerance: Patient tolerated treatment well Patient left: in chair;with call bell in reach;Other (comment) (chair alarm. ) General Behavior During Session: [facility] for tasks performed Cognition: Impaired, at baseline  Lennox Laity  [phone] 05/20/2011, 9:56 AM

## 2011-05-28 ENCOUNTER — Encounter: Payer: PRIVATE HEALTH INSURANCE | Admitting: Internal Medicine

## 2011-06-25 ENCOUNTER — Ambulatory Visit (INDEPENDENT_AMBULATORY_CARE_PROVIDER_SITE_OTHER): Payer: Medicare Other | Admitting: Internal Medicine

## 2011-06-25 ENCOUNTER — Encounter: Payer: Self-pay | Admitting: Internal Medicine

## 2011-06-25 VITALS — BP 122/78 | HR 72 | Ht 65.0 in | Wt 145.0 lb

## 2011-06-25 DIAGNOSIS — Z950 Presence of cardiac pacemaker: Secondary | ICD-10-CM

## 2011-06-25 DIAGNOSIS — I2510 Atherosclerotic heart disease of native coronary artery without angina pectoris: Secondary | ICD-10-CM

## 2011-06-25 DIAGNOSIS — I442 Atrioventricular block, complete: Secondary | ICD-10-CM

## 2011-06-25 LAB — PACEMAKER DEVICE OBSERVATION
AL AMPLITUDE: 2.8 mv
AL IMPEDENCE PM: 474 Ohm
ATRIAL PACING PM: 46.2
BAMS-0001: 175 {beats}/min
BATTERY VOLTAGE: 2.74 V
RV LEAD IMPEDENCE PM: 781 Ohm
VENTRICULAR PACING PM: 99.9

## 2011-06-25 NOTE — Progress Notes (Signed)
HPI [name] returns today for followup. If she is a very pleasant 78 year old woman with a history of complete heart block status post pacemaker insertion, diastolic heart failure, coronary disease, and recently diagnosed with a kidney stone. Her stone has moved but has not yet dislodged. The patient denies fevers or chills. No abdominal or groin pain. No peripheral edema. No syncope. Allergies  Allergen Reactions  . Codeine Other (See Comments)    Blacks out     Current Outpatient Prescriptions  Medication Sig Dispense Refill  . amitriptyline-chlordiazePOXIDE (LIMBITROL) 12.5-5 MG per tablet Take 1 tablet by mouth at bedtime.      Marland Kitchen aspirin EC 81 MG tablet Take 81 mg by mouth daily.      . Calcium Carbonate (CALCIUM 500 PO) Take 1 tablet by mouth daily.      . clopidogrel (PLAVIX) 75 MG tablet Take 75 mg by mouth daily.      Marland Kitchen estradiol (ESTRING) 2 MG vaginal ring Place 2 mg vaginally every 3 (three) months. follow package directions      . glimepiride (AMARYL) 4 MG tablet Take 2 mg by mouth daily before breakfast.        . HYDROcodone-acetaminophen (VICODIN) 5-500 MG per tablet Take 1 tablet by mouth every 6 (six) hours as needed. For pain.      . Lactobacillus (ACIDOPHILUS PO) Take 460 mg by mouth daily.      Marland Kitchen latanoprost (XALATAN) 0.005 % ophthalmic solution Place 1 drop into the left eye daily.      Marland Kitchen lovastatin (MEVACOR) 20 MG tablet Take 20 mg by mouth every evening.      . metFORMIN (GLUCOPHAGE) 1000 MG tablet Take 1,000 mg by mouth 2 (two) times daily.       . metoprolol (LOPRESSOR) 50 MG tablet Take 25 mg by mouth 2 (two) times daily.       . Multiple Vitamin (MULITIVITAMIN WITH MINERALS) TABS Take 1 tablet by mouth daily.      . Multiple Vitamins-Minerals (ICAPS PO) Take 1 capsule by mouth daily.      . niacin 500 MG tablet Take 500 mg by mouth 2 (two) times daily.      . nitroGLYCERIN (NITROSTAT) 0.4 MG SL tablet Place 0.4 mg under the tongue every 5 (five) minutes as  needed. For chest pain.      Bertram Gala Glycol-Propyl Glycol (SYSTANE OP) Apply 1-2 drops to eye as needed. For eye irritation      . predniSONE (DELTASONE) 5 MG tablet Take 5 mg by mouth daily.       . raloxifene (EVISTA) 60 MG tablet Take 60 mg by mouth daily.        . ranitidine (ZANTAC) 300 MG tablet Take 150 mg by mouth 2 (two) times daily.      . Vitamin D, Ergocalciferol, (DRISDOL) 50000 UNITS CAPS Take 50,000 Units by mouth every 14 (fourteen) days.      Marland Kitchen PRESCRIPTION MEDICATION See admin instructions. Antibiotic possibly ciprofloxacin will call pharmacy when open to clarify      . DISCONTD: CALCIUM PO Take by mouth.        . DISCONTD: nitroGLYCERIN (NITROSTAT) 0.4 MG SL tablet Place 1 tablet (0.4 mg total) under the tongue every 5 (five) minutes as needed for chest pain.  25 tablet  11     Past Medical History  Diagnosis Date  . Pacemaker   . CAD (coronary artery disease)   . CHB (complete heart block)   .  HTN (hypertension), benign   . DM (diabetes mellitus)   . Hypertrophic cardiomyopathy   . Hyperlipidemia   . OP (osteoporosis)   . Hx: UTI (urinary tract infection)   . Fatigue   . Rheumatoid arthritis   . Chronic steroid use   . Confusion   . Pancreatic pseudocyst   . Diverticulosis   . Renal calculus     ROS:   All systems reviewed and negative except as noted in the HPI.   Past Surgical History  Procedure Date  . Insert / replace / remove pacemaker   . Cardiac catheterization 04/17/2005    EF 65%  . Septal myectomy   . Nephrectomy     RIGHT  . Arm fracture surgery     RIGHT ARM  . Coronary artery bypass graft 04/2005    LIMA GRAFT TO THE DISTAL LAD, SAPHENOUS VEIN GRAFT TO THE DIAGONAL, SAPHENOUS VEIN GRAFT TO THE INTERMEDIATE BRANCH, AND SAPHENOUS VEIN GRAFT TO THE DISTAL RIGHT CORONARY  . Myomectomy   . Cataract extraction, bilateral   . Cystoscopy 05/07/2011    Procedure: CYSTOSCOPY;  Surgeon: Crecencio Mc, MD;  Location: [facility] OR;  Service: Urology;   Laterality: Left;     Family History  Problem Relation Age of Onset  . Lung cancer Mother   . Cancer Father   . Heart attack Brother      History   Social History  . Marital Status: Widowed    Spouse Name: N/A    Number of Children: 1  . Years of Education: N/A   Occupational History  .     Social History Main Topics  . Smoking status: Former Smoker -- 1.0 packs/day    Types: Cigarettes    Quit date: 05/07/1990  . Smokeless tobacco: Never Used  . Alcohol Use: No  . Drug Use: No  . Sexually Active: Not on file   Other Topics Concern  . Not on file   Social History Narrative  . No narrative on file     BP 122/78  Pulse 72  Ht 5\' 5"  (1.651 m)  Wt 145 lb (65.772 kg)  BMI 24.13 kg/m2  Physical Exam:  Well appearing elderly woman, NAD HEENT: Unremarkable Neck:  No JVD, no thyromegally Lungs:  Clear with no wheezes, rales, or rhonchi. HEART:  Regular rate rhythm, 2/6 systolic murmur, no rubs, no clicks Abd:  soft, positive bowel sounds, no organomegally, no rebound, no guarding Ext:  2 plus pulses, no edema, no cyanosis, no clubbing Skin:  No rashes no nodules Neuro:  CN II through XII intact, motor grossly intact  DEVICE  Normal device function.  See PaceArt for details.   Assess/Plan:

## 2011-06-25 NOTE — Patient Instructions (Addendum)
Remote monitoring is used to monitor your Pacemaker of ICD from home. This monitoring reduces the number of office visits required to check your device to one time per year. It allows us to keep an eye on the functioning of your device to ensure it is working properly. You are scheduled for a device check from home on September 25, 2011. You may send your transmission at any time that day. If you have a wireless device, the transmission will be sent automatically. After your physician reviews your transmission, you will receive a postcard with your next transmission date.  Your physician wants you to follow-up in: 1 year with Dr Taylor.  You will receive a reminder letter in the mail two months in advance. If you don't receive a letter, please call our office to schedule the follow-up appointment.  Your physician recommends that you continue on your current medications as directed. Please refer to the Current Medication list given to you today.  

## 2011-06-25 NOTE — Assessment & Plan Note (Signed)
She denies anginal symptoms. Note prior bypass surgery. She will continue her current medical therapy.

## 2011-06-25 NOTE — Assessment & Plan Note (Signed)
Her device is working normally. She is 1-2 years out from elective replacement. We'll plan to recheck in several months.

## 2011-07-11 ENCOUNTER — Encounter (HOSPITAL_COMMUNITY): Payer: Self-pay | Admitting: Pharmacy Technician

## 2011-07-11 ENCOUNTER — Other Ambulatory Visit: Payer: Self-pay | Admitting: Urology

## 2011-07-11 ENCOUNTER — Telehealth: Payer: Self-pay | Admitting: Cardiology

## 2011-07-11 NOTE — Telephone Encounter (Signed)
Spoke to patient's son was told ok to hold plavix for surgery.Stated patient will start holding 07/12/11.Pam at Dr.Tannenbaum's office will be called Monday 07/14/11.

## 2011-07-11 NOTE — Telephone Encounter (Signed)
Pt having ureterofcopy for kidney stone and they need to stop plavix asap so surgery can be scheduled they want to do surgery next week

## 2011-07-14 ENCOUNTER — Telehealth: Payer: Self-pay | Admitting: Cardiology

## 2011-07-14 ENCOUNTER — Other Ambulatory Visit: Payer: Self-pay | Admitting: Urology

## 2011-07-14 NOTE — Telephone Encounter (Signed)
Pt having ureteroscopy 07-17-11, dr Imelda Pillow office requested  surgical clearance and checking on status

## 2011-07-14 NOTE — Telephone Encounter (Signed)
Left message on Pam's voice mail at Dr.Tannenbaum spoke to Dr.Jordan ok for patient to hold plavix for kidney stone surgery.

## 2011-07-14 NOTE — Telephone Encounter (Signed)
Pam from Dr.Tannenbaum's office called no answer.Left message on voice mail spoke to Dr.Jordan ok for patient to hold plavix for kidney stone surgery.

## 2011-07-15 ENCOUNTER — Encounter (HOSPITAL_COMMUNITY)
Admission: RE | Admit: 2011-07-15 | Discharge: 2011-07-15 | Disposition: A | Discharge status: Home / Self Care | Payer: Medicare Other | Source: Ambulatory Visit | Attending: Urology | Admitting: Urology

## 2011-07-15 ENCOUNTER — Encounter (HOSPITAL_COMMUNITY): Payer: Self-pay

## 2011-07-15 HISTORY — DX: Other complications of anesthesia, initial encounter: T88.59XA

## 2011-07-15 HISTORY — DX: Reserved for inherently not codable concepts without codable children: IMO0001

## 2011-07-15 HISTORY — DX: Personal history of other diseases of the digestive system: Z87.19

## 2011-07-15 HISTORY — DX: Adverse effect of unspecified anesthetic, initial encounter: T41.45XA

## 2011-07-15 HISTORY — DX: Shortness of breath: R06.02

## 2011-07-15 HISTORY — DX: Spontaneous ecchymoses: R23.3

## 2011-07-15 HISTORY — DX: Encounter for other specified aftercare: Z51.89

## 2011-07-15 HISTORY — DX: Acquired absence of kidney: Z90.5

## 2011-07-15 HISTORY — DX: Sleep disorder, unspecified: G47.9

## 2011-07-15 HISTORY — DX: Other skin changes: R23.8

## 2011-07-15 HISTORY — DX: Gastro-esophageal reflux disease without esophagitis: K21.9

## 2011-07-15 LAB — BASIC METABOLIC PANEL
BUN: 16 mg/dL (ref 6–23)
Chloride: 105 mEq/L (ref 96–112)
GFR calc Af Amer: 50 mL/min — ABNORMAL LOW (ref >90)
GFR calc non Af Amer: 43 mL/min — ABNORMAL LOW (ref >90)
Potassium: 4.2 mEq/L (ref 3.5–5.1)
Sodium: 138 mEq/L (ref 135–145)

## 2011-07-15 LAB — CBC
HCT: 36.2 % (ref 36.0–46.0)
MCHC: 32 g/dL (ref 30.0–36.0)
Platelets: 295 10*3/uL (ref 150–400)
RDW: 15.3 % (ref 11.5–15.5)
WBC: 9.1 10*3/uL (ref 4.0–10.5)

## 2011-07-15 LAB — SURGICAL PCR SCREEN
MRSA, PCR: NEGATIVE
Staphylococcus aureus: NEGATIVE

## 2011-07-15 NOTE — Patient Instructions (Signed)
20 [name]  07/15/2011   Your procedure is scheduled on:  07/17/11  Report to SHORT STAY DEPT  at 6:00 AM.  Call this number if you have problems the morning of surgery: [phone]   Remember:   Do not eat food or drink liquids AFTER MIDNIGHT  May have clear liquids UNTIL 6 HOURS BEFORE SURGERY  Clear liquids include soda, tea, black coffee, apple or grape juice, broth.  Take these medicines the morning of surgery with A SIP OF WATER: RANITIDINE / PREDISONE / METOPROLOL   Do not wear jewelry, make-up or nail polish.  Do not wear lotions, powders, or perfumes.   Do not shave legs or underarms 48 hrs. before surgery (men may shave face)  Do not bring valuables to the hospital.  Contacts, dentures or bridgework may not be worn into surgery.  Leave suitcase in the car. After surgery it may be brought to your room.  For patients admitted to the hospital, checkout time is 11:00 AM the day of discharge.   Patients discharged the day of surgery will not be allowed to drive home.    Special Instructions:   Please read over the following fact sheets that you were given: MRSA  Information               SHOWER WITH BETASEPT THE NIGHT BEFORE SURGERY AND THE MORNING OF SURGERY

## 2011-07-17 ENCOUNTER — Encounter (HOSPITAL_COMMUNITY): Payer: Self-pay | Admitting: *Deleted

## 2011-07-17 ENCOUNTER — Encounter (HOSPITAL_COMMUNITY)
Admission: RE | Disposition: A | Discharge status: Home / Self Care | Payer: Self-pay | Source: Ambulatory Visit | Attending: Urology

## 2011-07-17 ENCOUNTER — Ambulatory Visit (HOSPITAL_COMMUNITY)
Admission: RE | Admit: 2011-07-17 | Discharge: 2011-07-17 | Disposition: A | Discharge status: Home / Self Care | Payer: Medicare Other | Source: Ambulatory Visit | Attending: Urology | Admitting: Urology

## 2011-07-17 ENCOUNTER — Ambulatory Visit (HOSPITAL_COMMUNITY): Payer: Medicare Other | Admitting: *Deleted

## 2011-07-17 DIAGNOSIS — Z950 Presence of cardiac pacemaker: Secondary | ICD-10-CM

## 2011-07-17 DIAGNOSIS — E162 Hypoglycemia, unspecified: Secondary | ICD-10-CM

## 2011-07-17 DIAGNOSIS — Z951 Presence of aortocoronary bypass graft: Secondary | ICD-10-CM | POA: Insufficient documentation

## 2011-07-17 DIAGNOSIS — Z79899 Other long term (current) drug therapy: Secondary | ICD-10-CM | POA: Insufficient documentation

## 2011-07-17 DIAGNOSIS — I442 Atrioventricular block, complete: Secondary | ICD-10-CM

## 2011-07-17 DIAGNOSIS — E872 Acidosis, unspecified: Secondary | ICD-10-CM

## 2011-07-17 DIAGNOSIS — E875 Hyperkalemia: Secondary | ICD-10-CM

## 2011-07-17 DIAGNOSIS — I10 Essential (primary) hypertension: Secondary | ICD-10-CM

## 2011-07-17 DIAGNOSIS — I2510 Atherosclerotic heart disease of native coronary artery without angina pectoris: Secondary | ICD-10-CM

## 2011-07-17 DIAGNOSIS — E785 Hyperlipidemia, unspecified: Secondary | ICD-10-CM

## 2011-07-17 DIAGNOSIS — N179 Acute kidney failure, unspecified: Secondary | ICD-10-CM

## 2011-07-17 DIAGNOSIS — N132 Hydronephrosis with renal and ureteral calculous obstruction: Secondary | ICD-10-CM

## 2011-07-17 DIAGNOSIS — N201 Calculus of ureter: Secondary | ICD-10-CM

## 2011-07-17 DIAGNOSIS — Q602 Renal agenesis, unspecified: Secondary | ICD-10-CM | POA: Insufficient documentation

## 2011-07-17 DIAGNOSIS — E119 Type 2 diabetes mellitus without complications: Secondary | ICD-10-CM

## 2011-07-17 DIAGNOSIS — I422 Other hypertrophic cardiomyopathy: Secondary | ICD-10-CM

## 2011-07-17 DIAGNOSIS — Z7982 Long term (current) use of aspirin: Secondary | ICD-10-CM | POA: Insufficient documentation

## 2011-07-17 DIAGNOSIS — Q605 Renal hypoplasia, unspecified: Secondary | ICD-10-CM | POA: Insufficient documentation

## 2011-07-17 DIAGNOSIS — K219 Gastro-esophageal reflux disease without esophagitis: Secondary | ICD-10-CM | POA: Insufficient documentation

## 2011-07-17 DIAGNOSIS — N1330 Unspecified hydronephrosis: Secondary | ICD-10-CM

## 2011-07-17 DIAGNOSIS — Z01812 Encounter for preprocedural laboratory examination: Secondary | ICD-10-CM | POA: Insufficient documentation

## 2011-07-17 DIAGNOSIS — H409 Unspecified glaucoma: Secondary | ICD-10-CM | POA: Insufficient documentation

## 2011-07-17 DIAGNOSIS — Z87891 Personal history of nicotine dependence: Secondary | ICD-10-CM | POA: Insufficient documentation

## 2011-07-17 HISTORY — PX: CYSTOSCOPY W/ URETERAL STENT PLACEMENT: SHX1429

## 2011-07-17 HISTORY — PX: CYSTOSCOPY/RETROGRADE/URETEROSCOPY/STONE EXTRACTION WITH BASKET: SHX5317

## 2011-07-17 LAB — GLUCOSE, CAPILLARY: Glucose-Capillary: 88 mg/dL (ref 70–99)

## 2011-07-17 SURGERY — CYSTOSCOPY, WITH CALCULUS REMOVAL USING BASKET
Anesthesia: General | Laterality: Left | Wound class: Clean Contaminated

## 2011-07-17 MED ORDER — ACETAMINOPHEN 10 MG/ML IV SOLN
INTRAVENOUS | Status: DC | PRN
  Administered 2011-07-17: 1000 mg via INTRAVENOUS

## 2011-07-17 MED ORDER — PROMETHAZINE HCL 25 MG/ML IJ SOLN: 6.2500 mg | INTRAMUSCULAR | Status: DC | PRN

## 2011-07-17 MED ORDER — FENTANYL CITRATE 0.05 MG/ML IJ SOLN
25.0000 ug | INTRAMUSCULAR | Status: DC | PRN
Start: 2011-07-17 — End: 2011-07-17

## 2011-07-17 MED ORDER — PROPOFOL 10 MG/ML IV EMUL
INTRAVENOUS | Status: DC | PRN
  Administered 2011-07-17: 100 mg via INTRAVENOUS
  Administered 2011-07-17: 50 mg via INTRAVENOUS

## 2011-07-17 MED ORDER — IOHEXOL 300 MG/ML  SOLN
INTRAMUSCULAR | Status: AC
  Filled 2011-07-17: qty 1

## 2011-07-17 MED ORDER — FENTANYL CITRATE 0.05 MG/ML IJ SOLN
INTRAMUSCULAR | Status: DC | PRN
  Administered 2011-07-17 (×2): 25 ug via INTRAVENOUS

## 2011-07-17 MED ORDER — LACTATED RINGERS IV SOLN: INTRAVENOUS | Status: DC

## 2011-07-17 MED ORDER — ONDANSETRON HCL 4 MG/2ML IJ SOLN
INTRAMUSCULAR | Status: DC | PRN
  Administered 2011-07-17: 2 mg via INTRAVENOUS

## 2011-07-17 MED ORDER — LIDOCAINE HCL (CARDIAC) 20 MG/ML IV SOLN
INTRAVENOUS | Status: DC | PRN
  Administered 2011-07-17: 50 mg via INTRAVENOUS

## 2011-07-17 MED ORDER — SODIUM CHLORIDE 0.9 % IR SOLN
Status: DC | PRN
  Administered 2011-07-17: 1000 mL

## 2011-07-17 MED ORDER — SODIUM CHLORIDE 0.9 % IV SOLN
INTRAVENOUS | Status: DC | PRN
  Administered 2011-07-17 (×2): via INTRAVENOUS

## 2011-07-17 MED ORDER — CEFAZOLIN SODIUM 1-5 GM-% IV SOLN
INTRAVENOUS | Status: AC
  Filled 2011-07-17: qty 50

## 2011-07-17 MED ORDER — TRIMETHOPRIM 100 MG PO TABS
100.0000 mg | ORAL_TABLET | ORAL | Status: AC
Start: 2011-07-17 — End: 2011-07-27

## 2011-07-17 MED ORDER — 0.9 % SODIUM CHLORIDE (POUR BTL) OPTIME
TOPICAL | Status: DC | PRN
  Administered 2011-07-17: 1000 mL

## 2011-07-17 MED ORDER — IOHEXOL 300 MG/ML  SOLN
INTRAMUSCULAR | Status: DC | PRN
  Administered 2011-07-17: 50 mL

## 2011-07-17 MED ORDER — PHENYLEPHRINE HCL 10 MG/ML IJ SOLN
INTRAMUSCULAR | Status: DC | PRN
  Administered 2011-07-17 (×2): 20 ug via INTRAVENOUS

## 2011-07-17 MED ORDER — CEFAZOLIN SODIUM 1-5 GM-% IV SOLN
INTRAVENOUS | Status: DC | PRN
  Administered 2011-07-17: 1 g via INTRAVENOUS

## 2011-07-17 MED ORDER — ACETAMINOPHEN 10 MG/ML IV SOLN
INTRAVENOUS | Status: AC
  Filled 2011-07-17: qty 100

## 2011-07-17 MED ORDER — MEPERIDINE HCL 50 MG/ML IJ SOLN: 6.2500 mg | INTRAMUSCULAR | Status: DC | PRN

## 2011-07-17 MED ORDER — CEFAZOLIN SODIUM 1-5 GM-% IV SOLN: 1.0000 g | INTRAVENOUS | Status: DC

## 2011-07-17 MED ORDER — PHENAZOPYRIDINE HCL 200 MG PO TABS
200.0000 mg | ORAL_TABLET | Freq: Three times a day (TID) | ORAL | Status: AC | PRN
Start: 2011-07-17 — End: 2011-07-20

## 2011-07-17 MED ORDER — SODIUM CHLORIDE 0.9 % IR SOLN
Status: DC | PRN
  Administered 2011-07-17: 3000 mL

## 2011-07-17 SURGICAL SUPPLY — 22 items
ADAPTER CATH URET PLST 4-6FR (CATHETERS) ×2 IMPLANT
BAG URO CATCHER STRL LF (DRAPE) ×2 IMPLANT
BASKET STNLS GEMINI 4WIRE 3FR (BASKET) ×2 IMPLANT
CATH INTERMIT  6FR 70CM (CATHETERS) ×2 IMPLANT
CATH URET 5FR 28IN OPEN ENDED (CATHETERS) IMPLANT
CLOTH BEACON ORANGE TIMEOUT ST (SAFETY) ×2 IMPLANT
DRAPE CAMERA CLOSED 9X96 (DRAPES) ×2 IMPLANT
GLOVE BIOGEL M STRL SZ7.5 (GLOVE) ×2 IMPLANT
GLOVE SURG SS PI 8.0 STRL IVOR (GLOVE) ×2 IMPLANT
GOWN PREVENTION PLUS XLARGE (GOWN DISPOSABLE) ×2 IMPLANT
GOWN STRL NON-REIN LRG LVL3 (GOWN DISPOSABLE) ×2 IMPLANT
GOWN STRL REIN XL XLG (GOWN DISPOSABLE) ×2 IMPLANT
GUIDEWIRE STR DUAL SENSOR (WIRE) ×2 IMPLANT
LASER FIBER DISP (UROLOGICAL SUPPLIES) ×2 IMPLANT
MANIFOLD NEPTUNE II (INSTRUMENTS) ×2 IMPLANT
MARKER SKIN DUAL TIP RULER LAB (MISCELLANEOUS) ×2 IMPLANT
NS IRRIG 1000ML POUR BTL (IV SOLUTION) ×2 IMPLANT
PACK CYSTO (CUSTOM PROCEDURE TRAY) ×2 IMPLANT
SCRUB PCMX 4 OZ (MISCELLANEOUS) ×2 IMPLANT
STENT POLARIS 5FRX24 (STENTS) ×2 IMPLANT
SYRINGE IRR TOOMEY STRL 70CC (SYRINGE) ×2 IMPLANT
TUBING CONNECTING 10 (TUBING) ×2 IMPLANT

## 2011-07-17 NOTE — Anesthesia Preprocedure Evaluation (Addendum)
Anesthesia Evaluation  Patient identified by MRN, date of birth, ID band Patient awake    Reviewed: Allergy & Precautions, H&P , NPO status , Patient's Chart, lab work & pertinent test results, reviewed documented beta blocker date and time   History of Anesthesia Complications Negative for: history of anesthetic complications  Airway Mallampati: II TM Distance: >3 FB Neck ROM: full    Dental No notable dental hx.    Pulmonary neg pulmonary ROS,  breath sounds clear to auscultation  Pulmonary exam normal       Cardiovascular hypertension, On Home Beta Blockers and On Medications + CAD and + CABG + dysrhythmias + pacemaker Rhythm:Regular Rate:Normal     Neuro/Psych negative neurological ROS  negative psych ROS   GI/Hepatic negative GI ROS, Neg liver ROS,   Endo/Other  Diabetes mellitus-, Poorly Controlled  Renal/GU negative Renal ROS  negative genitourinary   Musculoskeletal  (+) Arthritis -, Rheumatoid disorders,    Abdominal   Peds  Hematology negative hematology ROS (+)   Anesthesia Other Findings See surgeon's H&P   Reproductive/Obstetrics negative OB ROS                          Anesthesia Physical  Anesthesia Plan  ASA: III and Emergent  Anesthesia Plan: General   Post-op Pain Management:    Induction: Intravenous  Airway Management Planned:   Additional Equipment:   Intra-op Plan:   Post-operative Plan:   Informed Consent: I have reviewed the patients History and Physical, chart, labs and discussed the procedure including the risks, benefits and alternatives for the proposed anesthesia with the patient or authorized representative who has indicated his/her understanding and acceptance.   Dental advisory given  Plan Discussed with: CRNA and Surgeon  Anesthesia Plan Comments:        Anesthesia Quick Evaluation

## 2011-07-17 NOTE — H&P (Signed)
History of Present Illness    78 yo white female with h/o Lt ureteral stone presents for continued evaluation.  She was recently hospitalized on 05/07/11 for acute renal failure, Lt ureteral stone with a solitary Lt kidney.  She had a Lt JJ stent placed by Dr. Laverle Patter on 05/07/11.  She has not had prior stones.  This was the only stone found on CTU.  At f/u 06/17/11, CTU confirmed that the previously noted proximal ureteral stone had migrated distally into the distal left ureter a few centimeters away from the UVJ.  DJ stent appeared in good position.  The stone could not be well visualized on KUB performed prior to the CTU at that visit.  She has continued with the DJ stent and is tolerating this well for the most part.  She denies flank pain, fever or chills.  She has increased frequency and urgency but denies dysuria or gross hematuria.  She presents with her son today for further evaluation.  She has not passed any stones since we saw her last.    Urologic Hx: Hx of chronic pan resistant UTI.  She started having asympomatic gross hematuria on 04/26/11.  Urinalysis looked like she had a UTI. C&S - Klebsiella bacteria treated with Cipro then started Cephalexin 250mg  QD for suppression.     Hx of urinary urgency. "I can go out shopping or to the doctor's appointments and go hours without voiding but at home will need to void Q30 minutes." Denies hematuria.    On bethanachol 1 qid for hypotonic bladder, but struggling to find the right time to take her meds. She has a very low flow 2ndary poor detrussor contraction, and also has a very low LPP (pipestem urethra). She has been using pad and is doing well.      She was given a script for Zyvox ( $2200.00) but did not get the script filled. Clinically, she has done well with trimethoprim 100mg /day, even though she has in vitro evidence of resistance. She feels well, and will continue in this medication unless she becomes systemically ill.   Past Medical  History Problems  1. History of  Arthritis V13.4 2. History of  Diabetes Mellitus 250.00 3. History of  Esophageal Reflux 530.81 4. History of  Glaucoma 365.9 5. History of  Heartburn 787.1 6. History of  Sciatica 724.3  Surgical History Problems  1. History of  CABG (CABG) V45.81 2. History of  Cystoscopy With Insertion Of Ureteral Stent Left 3. History of  Nephrectomy Right V45.73  Current Meds 1. Aspirin 81 MG Oral Tablet; Therapy: (Recorded:30Jul2008) to 2. Centrum Silver TABS; Therapy: (Recorded:30Jul2008) to 3. Dicyclomine HCl 20 MG Oral Tablet; Therapy: 16May2013 to 4. Estring 2 MG Vaginal Ring; INSERT 1 RING INTRAVAGINALLY EVERY 3    MONTHS; Therapy:  30Jul2008 to (Evaluate:23Mar2013)  Requested for: 25Sep2012; Last Rx:24Sep2012 5. Evista 60 MG Oral Tablet; Therapy: (Recorded:30Jul2008) to 6. Florastor 250 MG Oral Capsule; Therapy: (Recorded:13Jun2011) to 7. Glimepiride 4 MG Oral Tablet; Therapy: 04Mar2013 to 8. Hydrocodone-Acetaminophen 5-500 MG Oral Tablet; Therapy: 27Dec2010 to 9. ICaps MV Oral Tablet; Therapy: (Recorded:30Jul2008) to 10. Limbitrol 5-12.5 MG TABS; Therapy: (Recorded:30Jul2008) to 11. Lovastatin 20 MG Oral Tablet; Therapy: (Recorded:30Jul2008) to 12. Metoprolol Tartrate TABS; Therapy: (Recorded:21Jul2010) to 13. Niacin 100 MG Oral Tablet; Therapy: (Recorded:27Aug2008) to 14. Nystatin 100000 UNIT/GM External Cream; Therapy: 15Mar2013 to 15. Plavix 75 MG Oral Tablet; Therapy: 14Jan2011 to 16. PredniSONE 5 MG Oral Tablet; Therapy: (Recorded:30Jul2008) to 17. Ranitidine HCl 300 MG Oral  Tablet; Therapy: 10Jan2011 to 18. Timolol Maleate 0.5 % Ophthalmic Solution; Therapy: 06May2013 to 19. Vitamin D TABS; Therapy: (Recorded:30Jul2008) to 20. Xalatan 0.005 % Ophthalmic Solution; Therapy: 08Feb2011 to 21. Zinc TABS; Therapy: (Recorded:30Jul2008) to  Allergies Medication  1. Bethanechol Chloride TABS 2. Codeine Derivatives  Family History Problems  1.  Family history of  Acute Myocardial Infarction V17.3 2. Maternal history of  Cancer 3. Paternal history of  Cancer 4. Family history of  Coronary Artery Disease 5. Family history of  Death In The Family Father 49 6. Family history of  Death In The Family Mother 75 7. Family history of  Family Health Status Number Of Children 1 son  Social History Problems  1. Marital History - Widowed 2. Occupation: Social research officer, government 3. Tobacco Use V15.82 1 ppd x 74yrs, quit 15 yrs ago Denied  4. Alcohol Use  Review of Systems Genitourinary, constitutional, skin, eye, otolaryngeal, hematologic/lymphatic, cardiovascular, pulmonary, endocrine, musculoskeletal, gastrointestinal, neurological and psychiatric system(s) were reviewed and pertinent findings if present are noted.  Genitourinary: urinary frequency and urinary urgency.    Vitals Vital Signs [Data Includes: Last 1 Day]  30May2013 02:29PM  Blood Pressure: 110 / 67 Temperature: 97.3 F Heart Rate: 80 30May2013 02:23PM  BMI Calculated: 23.39 BSA Calculated: 1.67 Height: 5 ft 4 in Weight: 137 lb   Physical Exam Constitutional: Well nourished and well developed . No acute distress.  ENT:. The ears and nose are normal in appearance.  Neck: The appearance of the neck is normal and no neck mass is present.  Pulmonary: No respiratory distress and normal respiratory rhythm and effort.  Cardiovascular:. No peripheral edema.  Skin: Normal skin turgor, no visible rash and no visible skin lesions.  Neuro/Psych:. Mood and affect are appropriate.    Results/Data Urine [Data Includes: Last 1 Day]   30May2013  COLOR YELLOW   APPEARANCE CLEAR   SPECIFIC GRAVITY 1.025   pH 5.5   GLUCOSE NEG mg/dL  BILIRUBIN SMALL   KETONE NEG mg/dL  BLOOD LARGE   PROTEIN 100 mg/dL  UROBILINOGEN 0.2 mg/dL  NITRITE NEG   LEUKOCYTE ESTERASE LARGE   SQUAMOUS EPITHELIAL/HPF FEW   WBC TNTC WBC/hpf  RBC 21-50 RBC/hpf  BACTERIA MODERATE   CRYSTALS NONE SEEN    CASTS NONE SEEN    The following images/tracing/specimen were independently visualized: .  CTU confirms that the previously documented distal left ureteral stone remains unchanged in position in the distal left ureter as compared to CTU from 06/17/11.  Stent appears in good position.  No other Urolithiasis noted.  The following clinical lab reports were reviewed: Marland Kitchen  Urinalysis Selected Results  UA With REFLEX 30May2013 01:42PM Bruning, Ashlyn   Test Name Result Flag Reference  COLOR YELLOW  YELLOW  APPEARANCE CLEAR  CLEAR  SPECIFIC GRAVITY 1.025  1.005-1.030  pH 5.5  5.0-8.0  GLUCOSE NEG mg/dL  NEG  BILIRUBIN SMALL A NEG  KETONE NEG mg/dL  NEG  BLOOD LARGE A NEG  PROTEIN 100 mg/dL A NEG  UROBILINOGEN 0.2 mg/dL  1.6-1.0  NITRITE NEG  NEG  LEUKOCYTE ESTERASE LARGE A NEG  SQUAMOUS EPITHELIAL/HPF FEW  RARE  WBC TNTC WBC/hpf A <4  RBC 21-50 RBC/hpf A <4  BACTERIA MODERATE A RARE  Microscopic exam performed on unconcentrated urine  CRYSTALS NONE SEEN  NONE SEEN  CASTS NONE SEEN  NONE SEEN   Assessment Assessed  1. Pyuria 791.9 2. Distal Ureteral Stone On The Left 592.1  Plan Health Maintenance (V70.0)  1. UA  With REFLEX  Done: 30May2013 01:42PM Proximal Ureteral Stone On The Left (592.1)  2. AU CT-STONE PROTOCOL  Done: 30May2013 12:00AM Pyuria (791.9)  3. URINE CULTURE  Done: 30May2013   Due to persistence of the left distal ureteral stone, it appears we will need to proceed with surgical intervention to extract the ureteral stone for definative tx. Urine C&S sent.  Will treat prn based on final report as pt is currently asymptomatic. She is on Plavix so we will consult with Dr. Peter [place] regarding stopping her Plavix preoperatively. Schedule cystoureteroscopy with left RGP, possible HLL, stone extraction and DJ stent exchange (Polaris stent).  Orders completed.   Signatures Electronically signed by : Marcello Fennel, PA-C; Jul 10 2011  3:44PM

## 2011-07-17 NOTE — Preoperative (Signed)
Beta Blockers   Reason not to administer Beta Blockers:Not Applicablept took beta blocker this a.m. 

## 2011-07-17 NOTE — Transfer of Care (Signed)
Immediate Anesthesia Transfer of Care Note  Patient: [name]  Procedure(s) Performed: Procedure(s) (LRB): CYSTOSCOPY/RETROGRADE/URETEROSCOPY/STONE EXTRACTION WITH BASKET (Left) CYSTOSCOPY WITH STENT REPLACEMENT (Left) HOLMIUM LASER APPLICATION (Left)  Patient Location: PACU  Anesthesia Type: General  Level of Consciousness: awake, alert , oriented and patient cooperative  Airway & Oxygen Therapy: Patient Spontanous Breathing and Patient connected to face mask oxygen  Post-op Assessment: Report given to PACU RN, Post -op Vital signs reviewed and stable and Patient moving all extremities  Post vital signs: Reviewed and stable  Complications: No apparent anesthesia complications

## 2011-07-17 NOTE — Op Note (Signed)
Pre-operative diagnosis : Retained left lower ureteral calculus (8 mm)  Postoperative diagnosis: Same  Operation: Cystourethroscopy, removal of left double-J catheter, left retrograde PyeloGram with interpretation, left ureteroscopy, laser fractionation of left lower ureteral stone (8 mm) basket extraction of stone fragments, insertion of 5 [place] by 24 cm Polaris double-J stent.  Surgeon:  Kathie Rhodes. Patsi Sears, MD  First assistant: None  Anesthesia:  general  Preparation: After appropriate preanesthesia, the patient is brought to the operating room and placed upon the operating table in the dorsal supine position, where general LMA anesthesia was introduced. The left arm had previously been marked by the surgeon as appropriate side.   Review history: 78 year old chronically confused female with multiple medical abnormalities, with specific urologic problems of diabetes, and solitary left kidney, post left double-J stent for 8 mm left upper ureteral calculus, and gross hematuria with coincidental urinary tract infection with Klebsiella on March 16. The patient has been unable to pass her stone, and because of continued colic, and only partial progression of her stone down to her lower ureter, and because she continues to complain of spasm and pain as well as incontinence, she is now for extraction of her stone. Note her past history of hypotonic bladder, with coincidental "type stem urethra", using pads for incontinence, on trimethoprim 100 mg per day (suppression therapy).  Statement of  Likelihood of Success: Excellent. TIME-OUT observed.:  Procedure: Cystourethroscopy was accomplished, and the left double-J stent is identified, and it is noted that there is a large amount of encrustation on the bottom of the stent. This indicates that the patient has lithogenic urine. Using the grasper, the double-J was removed without difficulty. The bladder is irrigated free of the stone material and this is  discarded. Left retrograde PolyGram is then performed with a 6 [place] open-ended catheter, and an 8 mm stone is identified in the left lower ureter. Following this, the short ureteroscope was passed through the ureteral orifice, which has now been dilated by having a stent within it for several weeks. The lower ureteral calculus is identified by direct vision, and using the 200  laser fiber, with laser settings of 0.5 and 5, the stone is broken into small component parts, and these stone components are removed with a basket in an atraumatic fashion. Clot was noted within the ureter, but no fresh bleeding was identified. Repeat ureteroscopy was accomplished, and additional fragments removed. No new stone was identified. I elected to place a double-J stent at the end of the procedure, using a 5 [place] by 24 cm Polaris stent, because of the patient's solitary kidney, and use of the laser with ureteroscopy. This was placed without difficulty, under fluoroscopic control. Following this, the bladder was irrigated free of fluid. She was awakened, and taken to recovery room in good condition.

## 2011-07-17 NOTE — Discharge Instructions (Signed)
DISCHARGE INSTRUCTIONS FOR KIDNEY STONES OR URETERAL STENT  MEDICATIONS:   1. DO NOT RESUME YOUR ASPIRIN, or any other medicines like ibuprofen, motrin, excedrin, advil, aleve, vitamin E, fish oil as these can all cause bleeding x 7 days.  2. Resume all your other meds from home - except do not take any other pain meds that you may have at home.  ACTIVITY 1. No strenuous activity x 1week 2. No driving while on narcotic pain medications 3. Drink plenty of water 4. Continue to walk at home - you can still get blood clots when you are at home, so keep active, but don't over do it. 5. May return to work in 3 days.  BATHING 1. You can shower and we recommend daily showers  2. If you have a string coming from your urethra:  The stent string is attached to your ureteral stent.  Do not pull on this.   SIGNS/SYMPTOMS TO CALL: 1. Please call us if you have a fever greater than 101.5, uncontrolled  nausea/vomiting, uncontrolled pain, dizziness, unable to urinate, bloody urine, chest pain, shortness of breath, leg swelling, leg pain, redness around wound, drainage from wound, or any other concerns or questions.  You can reach [place] at [phone].  FOLLOW-UP 1. You have an appointment: per office, for removal of the JJ stent. [phone] for scheduling.   You may feel an odd sensation in your back, or urine frequency. ( just the JJ stent. Not significant)

## 2011-07-17 NOTE — Interval H&P Note (Signed)
History and Physical Interval Note:  07/17/2011 8:38 AM  [name]  has presented today for surgery, with the diagnosis of LEFT DISTAL URETERAL STONE  The various methods of treatment have been discussed with the patient and family. After consideration of risks, benefits and other options for treatment, the patient has consented to  Procedure(s) (LRB): CYSTOSCOPY/RETROGRADE/URETEROSCOPY/STONE EXTRACTION WITH BASKET (Left) CYSTOSCOPY WITH STENT REPLACEMENT (Left) HOLMIUM LASER APPLICATION (Left) as a surgical intervention .  The patients' history has been reviewed, patient examined, no change in status, stable for surgery.  I have reviewed the patients' chart and labs.  Questions were answered to the patient's satisfaction.     Jethro Bolus I

## 2011-07-17 NOTE — Anesthesia Postprocedure Evaluation (Signed)
  Anesthesia Post-op Note  Patient: [name]  Procedure(s) Performed: Procedure(s) (LRB): CYSTOSCOPY/RETROGRADE/URETEROSCOPY/STONE EXTRACTION WITH BASKET (Left) CYSTOSCOPY WITH STENT REPLACEMENT (Left) HOLMIUM LASER APPLICATION (Left)  Patient Location: PACU  Anesthesia Type: General  Level of Consciousness: awake and alert   Airway and Oxygen Therapy: Patient Spontanous Breathing  Post-op Pain: mild  Post-op Assessment: Post-op Vital signs reviewed, Patient's Cardiovascular Status Stable, Respiratory Function Stable, Patent Airway and No signs of Nausea or vomiting  Post-op Vital Signs: stable  Complications: No apparent anesthesia complications

## 2011-07-17 NOTE — Progress Notes (Signed)
IV discontinued. Site looks good. Tip in tact

## 2011-07-18 ENCOUNTER — Encounter (HOSPITAL_COMMUNITY): Payer: Self-pay | Admitting: Urology

## 2011-07-31 ENCOUNTER — Other Ambulatory Visit: Payer: Self-pay

## 2011-07-31 MED ORDER — METOPROLOL TARTRATE 50 MG PO TABS: 25.0000 mg | ORAL_TABLET | Freq: Two times a day (BID) | ORAL | Status: DC

## 2011-09-10 ENCOUNTER — Other Ambulatory Visit: Payer: Self-pay

## 2011-09-10 MED ORDER — CLOPIDOGREL BISULFATE 75 MG PO TABS: 75.0000 mg | ORAL_TABLET | Freq: Every day | ORAL | Status: DC

## 2011-09-10 NOTE — Telephone Encounter (Signed)
Requested Prescriptions   Pending Prescriptions Disp Refills  . clopidogrel (PLAVIX) 75 MG tablet 30 tablet 5    Sig: Take 1 tablet (75 mg total) by mouth daily before breakfast.

## 2011-09-25 ENCOUNTER — Ambulatory Visit (INDEPENDENT_AMBULATORY_CARE_PROVIDER_SITE_OTHER): Payer: Medicare Other | Admitting: *Deleted

## 2011-09-25 ENCOUNTER — Encounter: Payer: Self-pay | Admitting: Internal Medicine

## 2011-09-25 DIAGNOSIS — I442 Atrioventricular block, complete: Secondary | ICD-10-CM

## 2011-09-26 LAB — REMOTE PACEMAKER DEVICE
ATRIAL PACING PM: 51
BAMS-0001: 175 {beats}/min
VENTRICULAR PACING PM: 100

## 2011-10-03 ENCOUNTER — Encounter: Payer: Self-pay | Admitting: *Deleted

## 2011-12-26 ENCOUNTER — Ambulatory Visit (INDEPENDENT_AMBULATORY_CARE_PROVIDER_SITE_OTHER): Payer: Medicare Other | Admitting: Cardiology

## 2011-12-26 ENCOUNTER — Encounter: Payer: Self-pay | Admitting: Cardiology

## 2011-12-26 VITALS — BP 153/74 | HR 81 | Ht 64.0 in | Wt 138.4 lb

## 2011-12-26 DIAGNOSIS — I442 Atrioventricular block, complete: Secondary | ICD-10-CM

## 2011-12-26 DIAGNOSIS — I10 Essential (primary) hypertension: Secondary | ICD-10-CM

## 2011-12-26 DIAGNOSIS — Z950 Presence of cardiac pacemaker: Secondary | ICD-10-CM

## 2011-12-26 DIAGNOSIS — I2510 Atherosclerotic heart disease of native coronary artery without angina pectoris: Secondary | ICD-10-CM

## 2011-12-26 DIAGNOSIS — I422 Other hypertrophic cardiomyopathy: Secondary | ICD-10-CM

## 2011-12-26 NOTE — Progress Notes (Signed)
[name] Date of Birth: 05/03/1924   History of Present Illness: [name] is seen for followup visit today. She is seen with her son. She has a history of coronary disease and is status post CABG in March of 2007.  She also had a septal myectomy at that time for hypertrophic cardiomyopathy. She is status post pacemaker implant for complete heart block. She has done very well since her last visit. She denies any significant chest pain or shortness of breath. She's had no increase in edema. She denies any dizziness or palpitations.  Current Outpatient Prescriptions on File Prior to Visit  Medication Sig Dispense Refill  . amitriptyline-chlordiazePOXIDE (LIMBITROL) 12.5-5 MG per tablet Take 1 tablet by mouth at bedtime.      . Calcium Carbonate (CALCIUM 500 PO) Take 1 tablet by mouth daily.      . clopidogrel (PLAVIX) 75 MG tablet Take 1 tablet (75 mg total) by mouth daily before breakfast.  30 tablet  5  . estradiol (ESTRING) 2 MG vaginal ring Place 2 mg vaginally every 3 (three) months. follow package directions      . glimepiride (AMARYL) 4 MG tablet Take 2 mg by mouth daily before breakfast.        . HYDROcodone-acetaminophen (VICODIN) 5-500 MG per tablet Take 1 tablet by mouth every 6 (six) hours as needed. For pain.      . Lactobacillus (ACIDOPHILUS PO) Take 460 mg by mouth daily.      Marland Kitchen latanoprost (XALATAN) 0.005 % ophthalmic solution Place 1 drop into the left eye at bedtime.       . lovastatin (MEVACOR) 20 MG tablet Take 20 mg by mouth every evening.      . metFORMIN (GLUCOPHAGE) 1000 MG tablet Take 1,000 mg by mouth 2 (two) times daily.       . metoprolol (LOPRESSOR) 50 MG tablet Take 0.5 tablets (25 mg total) by mouth 2 (two) times daily.  60 tablet  11  . Multiple Vitamin (MULITIVITAMIN WITH MINERALS) TABS Take 1 tablet by mouth daily.      . Multiple Vitamins-Minerals (ICAPS PO) Take 1 capsule by mouth daily.      . niacin 500 MG tablet Take 500 mg by mouth 2 (two) times  daily.      . nitroGLYCERIN (NITROSTAT) 0.4 MG SL tablet Place 0.4 mg under the tongue every 5 (five) minutes as needed. For chest pain.      Bertram Gala Glycol-Propyl Glycol (SYSTANE OP) Apply 1-2 drops to eye as needed. For eye irritation      . predniSONE (DELTASONE) 5 MG tablet Take 5 mg by mouth daily with breakfast.       . raloxifene (EVISTA) 60 MG tablet Take 60 mg by mouth daily.        . ranitidine (ZANTAC) 300 MG tablet Take 150 mg by mouth 2 (two) times daily.      . timolol (TIMOPTIC) 0.5 % ophthalmic solution Place 1 drop into both eyes daily.       . Vitamin D, Ergocalciferol, (DRISDOL) 50000 UNITS CAPS Take 50,000 Units by mouth every 14 (fourteen) days.      . [DISCONTINUED] CALCIUM PO Take by mouth.          Allergies  Allergen Reactions  . Codeine Other (See Comments)    Blacks out    Past Medical History  Diagnosis Date  . Pacemaker   . CAD (coronary artery disease)   . CHB (complete heart  block)   . HTN (hypertension), benign   . DM (diabetes mellitus)   . Hypertrophic cardiomyopathy   . Hyperlipidemia   . OP (osteoporosis)   . Hx: UTI (urinary tract infection)   . Fatigue   . Rheumatoid arthritis   . Chronic steroid use   . Confusion   . Pancreatic pseudocyst   . Diverticulosis   . Shortness of breath   . Bruises easily   . GERD (gastroesophageal reflux disease)   . History of IBS   . Renal calculus     has stent  . H/O unilateral nephrectomy   . Blood transfusion   . Difficulty sleeping     takes nightly med for sleep  . Complication of anesthesia     CONFUSION    Past Surgical History  Procedure Date  . Cardiac catheterization 04/17/2005    EF 65%  . Septal myectomy   . Nephrectomy     RIGHT  . Arm fracture surgery     RIGHT ARM  . Coronary artery bypass graft 04/2005    LIMA GRAFT TO THE DISTAL LAD, SAPHENOUS VEIN GRAFT TO THE DIAGONAL, SAPHENOUS VEIN GRAFT TO THE INTERMEDIATE BRANCH, AND SAPHENOUS VEIN GRAFT TO THE DISTAL RIGHT CORONARY   . Myomectomy   . Cataract extraction, bilateral   . Cystoscopy 05/07/2011    Procedure: CYSTOSCOPY;  Surgeon: Crecencio Mc, MD;  Location: [facility] OR;  Service: Urology;  Laterality: Left;  . Insert / replace / remove pacemaker 2006  . Cystoscopy/retrograde/ureteroscopy/stone extraction with basket 07/17/2011    Procedure: CYSTOSCOPY/RETROGRADE/URETEROSCOPY/STONE EXTRACTION WITH BASKET;  Surgeon: Kathi Ludwig, MD;  Location: [facility] ORS;  Service: Urology;  Laterality: Left;  Cystoscopy, Left Ureteroscopy, Retrograde Pyelogram, Stone Extraction, Holmium Laser Lithotripsy, Stent Exchange with Polaris Stent    . Cystoscopy w/ ureteral stent placement 07/17/2011    Procedure: CYSTOSCOPY WITH STENT REPLACEMENT;  Surgeon: Kathi Ludwig, MD;  Location: [facility] ORS;  Service: Urology;  Laterality: Left;  USE POLARIS STENT    History  Smoking status  . Former Smoker -- 1.0 packs/day  . Types: Cigarettes  . Quit date: 05/07/1990  Smokeless tobacco  . Never Used    History  Alcohol Use No    Family History  Problem Relation Age of Onset  . Lung cancer Mother   . Cancer Father   . Heart attack Brother     Review of Systems: As noted in history of present illness. She does complain of pain in her hips when she first gets up in the morning. She is on chronic steroid therapy. All other systems were reviewed and are negative.  Physical Exam: BP 153/74  Pulse 81  Ht 5\' 4"  (1.626 m)  Wt 62.778 kg (138 lb 6.4 oz)  BMI 23.76 kg/m2  SpO2 95% She is an elderly white female in no acute distress. She is normocephalic, atraumatic. Pupils are equal round and reactive to light and accommodation. Extraocular movements are full. Oropharynx is clear. Neck is supple without JVD, adenopathy, thyromegaly, or bruits. Lungs are clear. Cardiac exam reveals a grade 2/6 harsh systolic ejection murmur at the right upper sternal border. Abdomen is soft and nontender. Extremities are without edema. She has good pedal  pulses. She has extensive arthritic changes in her hands. Her neurologic exam is nonfocal. LABORATORY DATA:   Assessment / Plan: 1. Coronary disease status post CABG. She is asymptomatic. We will continue her medical therapy with aspirin, Plavix, and metoprolol. Followup again in 6 months.  2. Hypercholesterolemia on Mevacor.  3. Hypertrophic cardiomyopathy. Status post septal myectomy. She has a chronic outflow murmur.  4. Complete heart block status post pacemaker implant. She is scheduled for followup pacemaker check next week.

## 2011-12-26 NOTE — Patient Instructions (Signed)
Continue your current therapy  I will see you again in 6 months.   

## 2011-12-29 ENCOUNTER — Ambulatory Visit (INDEPENDENT_AMBULATORY_CARE_PROVIDER_SITE_OTHER): Payer: Medicare Other | Admitting: *Deleted

## 2011-12-29 DIAGNOSIS — Z950 Presence of cardiac pacemaker: Secondary | ICD-10-CM

## 2011-12-29 DIAGNOSIS — I442 Atrioventricular block, complete: Secondary | ICD-10-CM

## 2011-12-31 ENCOUNTER — Encounter: Payer: Self-pay | Admitting: Internal Medicine

## 2012-01-06 LAB — REMOTE PACEMAKER DEVICE
AL AMPLITUDE: 2.8 mv
AL IMPEDENCE PM: 482 Ohm
BAMS-0001: 175 {beats}/min
RV LEAD IMPEDENCE PM: 759 Ohm
RV LEAD THRESHOLD: 0.375 V
VENTRICULAR PACING PM: 99

## 2012-02-13 ENCOUNTER — Encounter: Payer: Self-pay | Admitting: *Deleted

## 2012-04-05 ENCOUNTER — Encounter: Payer: Medicare Other | Admitting: *Deleted

## 2012-04-15 ENCOUNTER — Encounter: Payer: Self-pay | Admitting: *Deleted

## 2012-07-01 ENCOUNTER — Encounter: Payer: Self-pay | Admitting: Cardiology

## 2012-07-01 ENCOUNTER — Ambulatory Visit (INDEPENDENT_AMBULATORY_CARE_PROVIDER_SITE_OTHER): Payer: Medicare Other | Admitting: Cardiology

## 2012-07-01 ENCOUNTER — Encounter: Payer: Self-pay | Admitting: Internal Medicine

## 2012-07-01 VITALS — BP 99/63 | HR 86 | Ht 65.0 in | Wt 143.8 lb

## 2012-07-01 DIAGNOSIS — I2510 Atherosclerotic heart disease of native coronary artery without angina pectoris: Secondary | ICD-10-CM

## 2012-07-01 DIAGNOSIS — I10 Essential (primary) hypertension: Secondary | ICD-10-CM

## 2012-07-01 DIAGNOSIS — I442 Atrioventricular block, complete: Secondary | ICD-10-CM

## 2012-07-01 DIAGNOSIS — Z950 Presence of cardiac pacemaker: Secondary | ICD-10-CM

## 2012-07-01 LAB — PACEMAKER DEVICE OBSERVATION
AL AMPLITUDE: 5.6 mv
ATRIAL PACING PM: 55.8
BATTERY VOLTAGE: 2.72 V
VENTRICULAR PACING PM: 98.4

## 2012-07-01 NOTE — Patient Instructions (Signed)
Remote monitoring is used to monitor your Pacemaker of ICD from home. This monitoring reduces the number of office visits required to check your device to one time per year. It allows [place] to keep an eye on the functioning of your device to ensure it is working properly. You are scheduled for a device check from home on 10/04/2012. You may send your transmission at any time that day. If you have a wireless device, the transmission will be sent automatically. After your physician reviews your transmission, you will receive a postcard with your next transmission date.  Your physician wants you to follow-up in: 1 year with Dr. Ladona Ridgel. You will receive a reminder letter in the mail two months in advance. If you don't receive a letter, please call our office to schedule the follow-up appointment.  Your physician recommends that you continue on your current medications as directed. Please refer to the Current Medication list given to you today.

## 2012-07-01 NOTE — Progress Notes (Signed)
ELECTROPHYSIOLOGY OFFICE NOTE  Patient ID: [name] MRN: [id number], DOB/AGE: 07-29-1924   Date of Visit: 07/01/2012  Primary Physician: Windle Guard, MD Primary Cardiologist / EP: [place], MD / Ladona Ridgel, MD Reason for Visit: EP/device follow-up  History of Present Illness  [name] is an 79 year old woman with CHB s/p PPM implant, CAD s/p CABG in 2007, HCM s/p septal myomectomy and HTN who presents today for routine electrophysiology followup. Since last being seen in our clinic, she reports she is doing well. She has no cardiac complaints. Today, she denies chest pain or shortness of breath. She denies palpitations, dizziness, near syncope or syncope. She denies LE swelling, orthopnea, PND or recent weight gain. [name] reports that she is compliant and tolerating medications without difficulty.  Past Medical History Past Medical History  Diagnosis Date  . Pacemaker   . CAD (coronary artery disease)   . CHB (complete heart block)   . HTN (hypertension), benign   . DM (diabetes mellitus)   . Hypertrophic cardiomyopathy   . Hyperlipidemia   . OP (osteoporosis)   . Hx: UTI (urinary tract infection)   . Fatigue   . Rheumatoid arthritis   . Chronic steroid use   . Confusion   . Pancreatic pseudocyst   . Diverticulosis   . Shortness of breath   . Bruises easily   . GERD (gastroesophageal reflux disease)   . History of IBS   . Renal calculus     has stent  . H/O unilateral nephrectomy   . Blood transfusion   . Difficulty sleeping     takes nightly med for sleep  . Complication of anesthesia     CONFUSION    Past Surgical History Past Surgical History  Procedure Laterality Date  . Cardiac catheterization  04/17/2005    EF 65%  . Septal myectomy    . Nephrectomy      RIGHT  . Arm fracture surgery      RIGHT ARM  . Coronary artery bypass graft  04/2005    LIMA GRAFT TO THE DISTAL LAD, SAPHENOUS VEIN GRAFT TO THE DIAGONAL, SAPHENOUS VEIN GRAFT TO THE INTERMEDIATE  BRANCH, AND SAPHENOUS VEIN GRAFT TO THE DISTAL RIGHT CORONARY  . Myomectomy    . Cataract extraction, bilateral    . Cystoscopy  05/07/2011    Procedure: CYSTOSCOPY;  Surgeon: Crecencio Mc, MD;  Location: [facility] OR;  Service: Urology;  Laterality: Left;  . Insert / replace / remove pacemaker  2006  . Cystoscopy/retrograde/ureteroscopy/stone extraction with basket  07/17/2011    Procedure: CYSTOSCOPY/RETROGRADE/URETEROSCOPY/STONE EXTRACTION WITH BASKET;  Surgeon: Kathi Ludwig, MD;  Location: [facility];  Service: Urology;  Laterality: Left;  Cystoscopy, Left Ureteroscopy, Retrograde Pyelogram, Stone Extraction, Holmium Laser Lithotripsy, Stent Exchange with Polaris Stent    . Cystoscopy w/ ureteral stent placement  07/17/2011    Procedure: CYSTOSCOPY WITH STENT REPLACEMENT;  Surgeon: Kathi Ludwig, MD;  Location: [facility] ORS;  Service: Urology;  Laterality: Left;  USE POLARIS STENT    Allergies/Intolerances Allergies  Allergen Reactions  . Codeine Other (See Comments)    Blacks out   Current Home Medications Current Outpatient Prescriptions  Medication Sig Dispense Refill  . amitriptyline-chlordiazePOXIDE (LIMBITROL) 12.5-5 MG per tablet Take 1 tablet by mouth at bedtime.      . Calcium Carbonate (CALCIUM 500 PO) Take 1 tablet by mouth daily.      . clopidogrel (PLAVIX) 75 MG tablet Take 1 tablet (75 mg total) by  mouth daily before breakfast.  30 tablet  5  . estradiol (ESTRING) 2 MG vaginal ring Place 2 mg vaginally every 3 (three) months. follow package directions      . glimepiride (AMARYL) 4 MG tablet Take 2 mg by mouth daily before breakfast.        . HYDROcodone-acetaminophen (VICODIN) 5-500 MG per tablet Take 1 tablet by mouth every 6 (six) hours as needed. For pain.      . Lactobacillus (ACIDOPHILUS PO) Take 460 mg by mouth daily.      Marland Kitchen latanoprost (XALATAN) 0.005 % ophthalmic solution Place 1 drop into the left eye at bedtime.       . lovastatin (MEVACOR) 20 MG tablet Take 20 mg by  mouth every evening.      . metFORMIN (GLUCOPHAGE) 1000 MG tablet Take 1,000 mg by mouth 2 (two) times daily.       . metoprolol (LOPRESSOR) 50 MG tablet Take 0.5 tablets (25 mg total) by mouth 2 (two) times daily.  60 tablet  11  . Multiple Vitamin (MULITIVITAMIN WITH MINERALS) TABS Take 1 tablet by mouth daily.      . Multiple Vitamins-Minerals (ICAPS PO) Take 1 capsule by mouth daily.      . niacin 500 MG tablet Take 500 mg by mouth 2 (two) times daily.      . nitroGLYCERIN (NITROSTAT) 0.4 MG SL tablet Place 0.4 mg under the tongue every 5 (five) minutes as needed. For chest pain.      Bertram Gala Glycol-Propyl Glycol (SYSTANE OP) Apply 1-2 drops to eye as needed. For eye irritation      . predniSONE (DELTASONE) 5 MG tablet Take 5 mg by mouth daily with breakfast.       . raloxifene (EVISTA) 60 MG tablet Take 60 mg by mouth daily.        . ranitidine (ZANTAC) 300 MG tablet Take 150 mg by mouth 2 (two) times daily.      . timolol (TIMOPTIC) 0.5 % ophthalmic solution Place 1 drop into both eyes daily.       Marland Kitchen trimethoprim (TRIMPEX) 100 MG tablet       . Vitamin D, Ergocalciferol, (DRISDOL) 50000 UNITS CAPS Take 50,000 Units by mouth every 14 (fourteen) days.      . [DISCONTINUED] CALCIUM PO Take by mouth.         No current facility-administered medications for this visit.   Social History Social History  . Marital Status: Widowed   Social History Main Topics  . Smoking status: Former Smoker -- 1.00 packs/day    Types: Cigarettes    Quit date: 05/07/1990  . Smokeless tobacco: Never Used  . Alcohol Use: No  . Drug Use: No   Review of Systems General: No chills, fever, night sweats or weight changes Cardiovascular: No chest pain, dyspnea on exertion, edema, orthopnea, palpitations, paroxysmal nocturnal dyspnea Dermatological: No rash, lesions or masses Respiratory: No cough, dyspnea Urologic: No hematuria, dysuria Abdominal: No nausea, vomiting, diarrhea, bright red blood per  rectum, melena, or hematemesis Neurologic: No visual changes, weakness, changes in mental status All other systems reviewed and are otherwise negative except as noted above.  Physical Exam Blood pressure 99/63, pulse 86, height 5\' 5"  (1.651 m), weight 143 lb 12.8 oz (65.227 kg).  General: Well developed, well appearing 79 year old female in no acute distress. HEENT: Normocephalic, atraumatic. EOMs intact. Sclera nonicteric. Oropharynx clear.  Neck: Supple without bruits. No JVD. Lungs: Respirations regular and unlabored,  CTA bilaterally. No wheezes, rales or rhonchi. Heart: RRR. S1, S2 present. II/VI systolic murmur at upper right sternal border. No rub, S3 or S4. Abdomen: Soft, non-distended. Extremities: No clubbing, cyanosis or edema. PT/Radials 2+ and equal bilaterally. Psych: Normal affect. Neuro: Alert and oriented X 3. Moves all extremities spontaneously.   Diagnostics Device interrogation today - Normal device function. Thresholds, sensing, impedances consistent with previous measurements. Device programmed to maximize longevity. 24 mode switches, <0.1% of time, longest 51 seconds. 3 high ventricular rates noted, longest 6 seconds, EGMs consistent with NSVT. Device programmed at appropriate safety margins. Histogram distribution appropriate for patient activity level. Estimated longevity 21 months.   Assessment and Plan 1. CHB s/p PPM implant Normal device function No programming changes made Continue routine remote device follow-up every 3 months Return for follow-up with Dr. Ladona Ridgel in one year 2. CAD Stable without anginal symptoms Continue medical therapy 3. HTN Normotensive today Continue current regimen  Signed, Shatera Rennert, PA-C 07/01/2012, 3:31 PM

## 2012-09-06 ENCOUNTER — Ambulatory Visit
Admission: RE | Admit: 2012-09-06 | Discharge: 2012-09-06 | Disposition: A | Discharge status: Home / Self Care | Payer: Medicare Other | Source: Ambulatory Visit | Attending: Family Medicine | Admitting: Family Medicine

## 2012-09-06 ENCOUNTER — Other Ambulatory Visit: Payer: Self-pay | Admitting: Family Medicine

## 2012-09-06 DIAGNOSIS — R52 Pain, unspecified: Secondary | ICD-10-CM

## 2012-09-22 ENCOUNTER — Other Ambulatory Visit: Payer: Self-pay

## 2012-09-22 MED ORDER — METOPROLOL TARTRATE 50 MG PO TABS: 25.0000 mg | ORAL_TABLET | Freq: Two times a day (BID) | ORAL | Status: AC

## 2012-09-29 ENCOUNTER — Other Ambulatory Visit: Payer: Self-pay

## 2012-09-29 MED ORDER — CLOPIDOGREL BISULFATE 75 MG PO TABS: 75.0000 mg | ORAL_TABLET | Freq: Every day | ORAL | Status: AC

## 2012-10-04 ENCOUNTER — Ambulatory Visit (INDEPENDENT_AMBULATORY_CARE_PROVIDER_SITE_OTHER): Payer: Medicare Other | Admitting: *Deleted

## 2012-10-04 DIAGNOSIS — I442 Atrioventricular block, complete: Secondary | ICD-10-CM

## 2012-10-04 DIAGNOSIS — Z950 Presence of cardiac pacemaker: Secondary | ICD-10-CM

## 2012-10-15 LAB — REMOTE PACEMAKER DEVICE
AL AMPLITUDE: 2.8 mv
AL THRESHOLD: 0.5 V
BAMS-0001: 175 {beats}/min
RV LEAD IMPEDENCE PM: 657 Ohm
RV LEAD THRESHOLD: 0.5 V
VENTRICULAR PACING PM: 97

## 2012-10-22 NOTE — Progress Notes (Signed)
MDT ppm remote. All functions normal, full details in paceart.  [facility] 01/10/13 & ROV Dr. Ladona Ridgel May/2015

## 2012-11-03 ENCOUNTER — Encounter: Payer: Self-pay | Admitting: *Deleted

## 2012-11-11 ENCOUNTER — Encounter: Payer: Self-pay | Admitting: Internal Medicine

## 2013-01-10 ENCOUNTER — Encounter: Payer: Medicare Other | Admitting: *Deleted

## 2013-02-09 ENCOUNTER — Inpatient Hospital Stay (HOSPITAL_COMMUNITY)
Admission: EM | Admit: 2013-02-09 | Discharge: 2013-02-10 | DRG: 637 | Disposition: A | Discharge status: Home / Self Care | Payer: Medicare Other | Attending: Internal Medicine | Admitting: Internal Medicine

## 2013-02-09 ENCOUNTER — Encounter (HOSPITAL_COMMUNITY): Payer: Self-pay | Admitting: Emergency Medicine

## 2013-02-09 ENCOUNTER — Inpatient Hospital Stay (HOSPITAL_COMMUNITY): Payer: Medicare Other

## 2013-02-09 DIAGNOSIS — Z7902 Long term (current) use of antithrombotics/antiplatelets: Secondary | ICD-10-CM

## 2013-02-09 DIAGNOSIS — I422 Other hypertrophic cardiomyopathy: Secondary | ICD-10-CM | POA: Diagnosis present

## 2013-02-09 DIAGNOSIS — N390 Urinary tract infection, site not specified: Secondary | ICD-10-CM | POA: Diagnosis present

## 2013-02-09 DIAGNOSIS — E162 Hypoglycemia, unspecified: Secondary | ICD-10-CM | POA: Diagnosis present

## 2013-02-09 DIAGNOSIS — K219 Gastro-esophageal reflux disease without esophagitis: Secondary | ICD-10-CM | POA: Diagnosis present

## 2013-02-09 DIAGNOSIS — E785 Hyperlipidemia, unspecified: Secondary | ICD-10-CM | POA: Diagnosis present

## 2013-02-09 DIAGNOSIS — E1169 Type 2 diabetes mellitus with other specified complication: Principal | ICD-10-CM | POA: Diagnosis present

## 2013-02-09 DIAGNOSIS — Z950 Presence of cardiac pacemaker: Secondary | ICD-10-CM | POA: Diagnosis present

## 2013-02-09 DIAGNOSIS — E871 Hypo-osmolality and hyponatremia: Secondary | ICD-10-CM

## 2013-02-09 DIAGNOSIS — M810 Age-related osteoporosis without current pathological fracture: Secondary | ICD-10-CM | POA: Diagnosis present

## 2013-02-09 DIAGNOSIS — F0390 Unspecified dementia without behavioral disturbance: Secondary | ICD-10-CM | POA: Diagnosis present

## 2013-02-09 DIAGNOSIS — I2510 Atherosclerotic heart disease of native coronary artery without angina pectoris: Secondary | ICD-10-CM | POA: Diagnosis present

## 2013-02-09 DIAGNOSIS — E119 Type 2 diabetes mellitus without complications: Secondary | ICD-10-CM | POA: Diagnosis present

## 2013-02-09 DIAGNOSIS — Z801 Family history of malignant neoplasm of trachea, bronchus and lung: Secondary | ICD-10-CM

## 2013-02-09 DIAGNOSIS — Z79899 Other long term (current) drug therapy: Secondary | ICD-10-CM

## 2013-02-09 DIAGNOSIS — I421 Obstructive hypertrophic cardiomyopathy: Secondary | ICD-10-CM | POA: Diagnosis present

## 2013-02-09 DIAGNOSIS — Z905 Acquired absence of kidney: Secondary | ICD-10-CM

## 2013-02-09 DIAGNOSIS — J189 Pneumonia, unspecified organism: Secondary | ICD-10-CM | POA: Diagnosis present

## 2013-02-09 DIAGNOSIS — M069 Rheumatoid arthritis, unspecified: Secondary | ICD-10-CM | POA: Diagnosis present

## 2013-02-09 DIAGNOSIS — Z87440 Personal history of urinary (tract) infections: Secondary | ICD-10-CM

## 2013-02-09 DIAGNOSIS — R410 Disorientation, unspecified: Secondary | ICD-10-CM | POA: Diagnosis present

## 2013-02-09 DIAGNOSIS — Z87891 Personal history of nicotine dependence: Secondary | ICD-10-CM

## 2013-02-09 DIAGNOSIS — Z87442 Personal history of urinary calculi: Secondary | ICD-10-CM

## 2013-02-09 DIAGNOSIS — Z9849 Cataract extraction status, unspecified eye: Secondary | ICD-10-CM

## 2013-02-09 DIAGNOSIS — Z8249 Family history of ischemic heart disease and other diseases of the circulatory system: Secondary | ICD-10-CM

## 2013-02-09 DIAGNOSIS — F05 Delirium due to known physiological condition: Secondary | ICD-10-CM | POA: Diagnosis present

## 2013-02-09 DIAGNOSIS — Z951 Presence of aortocoronary bypass graft: Secondary | ICD-10-CM

## 2013-02-09 DIAGNOSIS — IMO0002 Reserved for concepts with insufficient information to code with codable children: Secondary | ICD-10-CM

## 2013-02-09 DIAGNOSIS — I10 Essential (primary) hypertension: Secondary | ICD-10-CM | POA: Diagnosis present

## 2013-02-09 LAB — CBC WITH DIFFERENTIAL/PLATELET
Basophils Absolute: 0 10*3/uL (ref 0.0–0.1)
Basophils Relative: 0 % (ref 0–1)
Eosinophils Absolute: 0 10*3/uL (ref 0.0–0.7)
Eosinophils Relative: 0 % (ref 0–5)
HCT: 35.7 % — ABNORMAL LOW (ref 36.0–46.0)
Hemoglobin: 12.1 g/dL (ref 12.0–15.0)
Lymphocytes Relative: 8 % — ABNORMAL LOW (ref 12–46)
MCHC: 33.9 g/dL (ref 30.0–36.0)
MCV: 88.1 fL (ref 78.0–100.0)
Monocytes Absolute: 0.8 10*3/uL (ref 0.1–1.0)
Monocytes Relative: 15 % — ABNORMAL HIGH (ref 3–12)
Neutro Abs: 4.1 10*3/uL (ref 1.7–7.7)
Platelets: 151 10*3/uL (ref 150–400)
RDW: 14.7 % (ref 11.5–15.5)

## 2013-02-09 LAB — GLUCOSE, CAPILLARY
Glucose-Capillary: 100 mg/dL — ABNORMAL HIGH (ref 70–99)
Glucose-Capillary: 155 mg/dL — ABNORMAL HIGH (ref 70–99)
Glucose-Capillary: 105 mg/dL — ABNORMAL HIGH (ref 70–99)

## 2013-02-09 LAB — BASIC METABOLIC PANEL
BUN: 16 mg/dL (ref 6–23)
CO2: 20 mEq/L (ref 19–32)
Calcium: 7.8 mg/dL — ABNORMAL LOW (ref 8.4–10.5)
Chloride: 97 mEq/L (ref 96–112)
Creatinine, Ser: 1.05 mg/dL (ref 0.50–1.10)
GFR calc Af Amer: 54 mL/min — ABNORMAL LOW (ref >90)
Potassium: 3.9 mEq/L (ref 3.7–5.3)
Sodium: 132 mEq/L — ABNORMAL LOW (ref 137–147)

## 2013-02-09 LAB — URINALYSIS, ROUTINE W REFLEX MICROSCOPIC
Bilirubin Urine: NEGATIVE
Glucose, UA: NEGATIVE mg/dL
Ketones, ur: NEGATIVE mg/dL
Nitrite: NEGATIVE
Protein, ur: 30 mg/dL — AB
Urobilinogen, UA: 0.2 mg/dL (ref 0.0–1.0)
pH: 7 (ref 5.0–8.0)

## 2013-02-09 LAB — TROPONIN I: Troponin I: <0.3 ng/mL (ref <0.30)

## 2013-02-09 LAB — URINE MICROSCOPIC-ADD ON

## 2013-02-09 MED ORDER — DEXTROSE 5 % IV SOLN
1.0000 g | Freq: Once | INTRAVENOUS | Status: AC
  Administered 2013-02-09: 1 g via INTRAVENOUS
  Filled 2013-02-09: qty 10

## 2013-02-09 MED ORDER — ONDANSETRON HCL 4 MG PO TABS: 4.0000 mg | ORAL_TABLET | Freq: Four times a day (QID) | ORAL | Status: DC | PRN

## 2013-02-09 MED ORDER — ADULT MULTIVITAMIN W/MINERALS CH
1.0000 | ORAL_TABLET | Freq: Every day | ORAL | Status: DC
  Administered 2013-02-09 – 2013-02-10 (×2): 1 via ORAL
  Filled 2013-02-09 (×2): qty 1

## 2013-02-09 MED ORDER — DEXTROSE 50 % IV SOLN
INTRAVENOUS | Status: AC
  Filled 2013-02-09: qty 50

## 2013-02-09 MED ORDER — METOPROLOL TARTRATE 25 MG PO TABS
25.0000 mg | ORAL_TABLET | Freq: Two times a day (BID) | ORAL | Status: DC
  Administered 2013-02-09 – 2013-02-10 (×3): 25 mg via ORAL
  Filled 2013-02-09 (×4): qty 1

## 2013-02-09 MED ORDER — PREDNISONE 5 MG PO TABS
5.0000 mg | ORAL_TABLET | Freq: Every day | ORAL | Status: DC
  Administered 2013-02-10: 5 mg via ORAL
  Filled 2013-02-09 (×2): qty 1

## 2013-02-09 MED ORDER — LATANOPROST 0.005 % OP SOLN
1.0000 [drp] | Freq: Every day | OPHTHALMIC | Status: DC
  Administered 2013-02-09: 1 [drp] via OPHTHALMIC
  Filled 2013-02-09: qty 2.5

## 2013-02-09 MED ORDER — GUAIFENESIN-DM 100-10 MG/5ML PO SYRP: 5.0000 mL | ORAL_SOLUTION | ORAL | Status: DC | PRN

## 2013-02-09 MED ORDER — SODIUM CHLORIDE 0.9 % IJ SOLN
3.0000 mL | Freq: Two times a day (BID) | INTRAMUSCULAR | Status: DC
  Administered 2013-02-09 – 2013-02-10 (×2): 3 mL via INTRAVENOUS

## 2013-02-09 MED ORDER — FAMOTIDINE 20 MG PO TABS
20.0000 mg | ORAL_TABLET | Freq: Two times a day (BID) | ORAL | Status: DC
  Administered 2013-02-09 – 2013-02-10 (×3): 20 mg via ORAL
  Filled 2013-02-09 (×4): qty 1

## 2013-02-09 MED ORDER — TIMOLOL MALEATE 0.5 % OP SOLN
1.0000 [drp] | Freq: Every day | OPHTHALMIC | Status: DC
  Administered 2013-02-09 – 2013-02-10 (×2): 1 [drp] via OPHTHALMIC
  Filled 2013-02-09: qty 5

## 2013-02-09 MED ORDER — POLYETHYL GLYCOL-PROPYL GLYCOL 0.4-0.3 % OP SOLN: OPHTHALMIC | Status: DC | PRN

## 2013-02-09 MED ORDER — URIBEL 118 MG PO CAPS: 1.0000 | ORAL_CAPSULE | Freq: Four times a day (QID) | ORAL | Status: DC | PRN

## 2013-02-09 MED ORDER — ACETAMINOPHEN 325 MG PO TABS
650.0000 mg | ORAL_TABLET | Freq: Four times a day (QID) | ORAL | Status: DC | PRN
  Administered 2013-02-10: 650 mg via ORAL
  Filled 2013-02-09: qty 2

## 2013-02-09 MED ORDER — DEXTROSE 50 % IV SOLN
25.0000 mL | Freq: Once | INTRAVENOUS | Status: AC
  Administered 2013-02-09: 25 mL via INTRAVENOUS

## 2013-02-09 MED ORDER — METHENAMINE HIPPURATE 1 G PO TABS: 1.0000 g | ORAL_TABLET | Freq: Four times a day (QID) | ORAL | Status: DC

## 2013-02-09 MED ORDER — ACETAMINOPHEN 650 MG RE SUPP: 650.0000 mg | Freq: Four times a day (QID) | RECTAL | Status: DC | PRN

## 2013-02-09 MED ORDER — CLOPIDOGREL BISULFATE 75 MG PO TABS
75.0000 mg | ORAL_TABLET | Freq: Every day | ORAL | Status: DC
  Administered 2013-02-10: 08:00:00 75 mg via ORAL
  Filled 2013-02-09 (×2): qty 1

## 2013-02-09 MED ORDER — POLYVINYL ALCOHOL 1.4 % OP SOLN
1.0000 [drp] | OPHTHALMIC | Status: DC | PRN
  Filled 2013-02-09: qty 15

## 2013-02-09 MED ORDER — HEPARIN SODIUM (PORCINE) 5000 UNIT/ML IJ SOLN
5000.0000 [IU] | Freq: Three times a day (TID) | INTRAMUSCULAR | Status: DC
  Administered 2013-02-09 – 2013-02-10 (×4): 5000 [IU] via SUBCUTANEOUS
  Filled 2013-02-09 (×6): qty 1

## 2013-02-09 MED ORDER — NIACIN 500 MG PO TABS
500.0000 mg | ORAL_TABLET | Freq: Two times a day (BID) | ORAL | Status: DC
  Administered 2013-02-09 – 2013-02-10 (×2): 500 mg via ORAL
  Filled 2013-02-09 (×3): qty 1

## 2013-02-09 MED ORDER — RALOXIFENE HCL 60 MG PO TABS
60.0000 mg | ORAL_TABLET | Freq: Every day | ORAL | Status: DC
  Administered 2013-02-09 – 2013-02-10 (×2): 60 mg via ORAL
  Filled 2013-02-09 (×2): qty 1

## 2013-02-09 MED ORDER — DEXTROSE-NACL 5-0.9 % IV SOLN
INTRAVENOUS | Status: AC
  Administered 2013-02-09: 10:00:00 via INTRAVENOUS

## 2013-02-09 MED ORDER — SIMVASTATIN 10 MG PO TABS
10.0000 mg | ORAL_TABLET | Freq: Every day | ORAL | Status: DC
  Administered 2013-02-09 – 2013-02-10 (×2): 10 mg via ORAL
  Filled 2013-02-09 (×2): qty 1

## 2013-02-09 MED ORDER — LEVOFLOXACIN 500 MG PO TABS
500.0000 mg | ORAL_TABLET | Freq: Every day | ORAL | Status: DC
  Administered 2013-02-09: 500 mg via ORAL
  Filled 2013-02-09 (×3): qty 1

## 2013-02-09 MED ORDER — ONDANSETRON HCL 4 MG/2ML IJ SOLN: 4.0000 mg | Freq: Four times a day (QID) | INTRAMUSCULAR | Status: DC | PRN

## 2013-02-09 MED ORDER — NITROGLYCERIN 0.4 MG SL SUBL: 0.4000 mg | SUBLINGUAL_TABLET | SUBLINGUAL | Status: DC | PRN

## 2013-02-09 NOTE — ED Notes (Signed)
Pt given cup of orange juice

## 2013-02-09 NOTE — ED Notes (Signed)
Pt is eating breakfast. Her is son is at bedside.

## 2013-02-09 NOTE — ED Notes (Signed)
Per EMS, pt is DM and takes metformin at home.  Pt's son had difficulty arousing his mother and called 911.  EMS noted a CBG of 44 on scene, gave i amp of d50 and pt ate part of a pastry role, CBG rose to 145, currently 135.  Per son, pt is still somewhat below baseline, and has been "fighting an infection" recently

## 2013-02-09 NOTE — ED Notes (Signed)
Report given to POD C. Can go to C 29 when return from xray.

## 2013-02-09 NOTE — H&P (Signed)
Date: 02/09/2013               Patient Name:  [name] MRN: [id number]  DOB: [birth year] Age / Sex: 79 y.o., female   PCP: Kaleen Mask, MD         Medical Service: Internal Medicine Teaching Service         Attending Physician: Dr. Josem Kaufmann    First Contact: Dr. Windell Hummingbird Pager: [phone]  Second Contact: Dr. Darden Palmer Pager: [phone]       After Hours (After 5p/  First Contact Pager: [phone]  weekends / holidays): Second Contact Pager: [phone]   Chief Complaint: hypoglycemia  History of Present Illness:  This is a 79yo WF w/ PMH of DM type 2 (last A1c 6.8 05/2011), HTN, HOCM s/p septal myectomy, RA on chronic steroids, GERD, nephrolithiasis s/p L J stent, R nephrectomy, CAD s/p CABG 2007, complete HB w/ pacemaker who presents with hypoglycemia.  Patient was sleeping last night and per her son (who sleeps in her room with her) she was flailing around and making unusual noises around 1am. Son tried to wake pt up, but she was unarousable. He called EMS and upon their arrival her CBG 44. Pt received an amp of D50 and CBG rose to 145. At this point her mental status improved, though patient did have an episode of bowel incontinence that son said was diarrhea (pt usually continent). Son denied any shaking during the episode, no tongue biting. Patient was sweaty and hot when son woke her up.   Pt was taken to ED where CBG 122, then dropped again to 51. Another amp of D50 given and CBGs recovered to 150s. Pt had BMP and CBC, which were unremarkable. UA revealed green urine (SE of methenamine), 30 protein, trace Hgb, WBC too numerous to count. Pt given ceftriaxone 1mg  for UTI.   Pt does c/o dysuria x 2-3 days. Son had cold symptoms a few days ago, which he thinks he gave to his mother, but at this point she only has residual nonproductive cough x 2-3 days. Pt and son deny changes in appetite, CP, SOB, leg swelling, N/V. Pt lives w/ son and son reports that patient has been  increasingly confused over the past couple of years. She sundowns each night, so son sleeps in same room on a cot. Son is retired and able to help patient at home. At baseline, pt is able to ambulate without assistance, though son reports that over the last year pt becoming more unsteady, though no falls. She does get mildly winded while walking, though this is not new.  Pt was admitted for similar episode of hypoglycemia in March 2013. She takes metformin 1000mg  BID and amaryl 2mg  qAM. She takes these medications as prescribed, last took all medications yesterday. Pt has had recurrent UTI, last UTI per son was 3 months ago. She is on trimethoprim and methenamine for ppx of UTI. Cipro has worked well to treat UTIs in the past.  Meds: Current Facility-Administered Medications  Medication Dose Route Frequency Provider Last Rate Last Dose  . dextrose 5 %-0.9 % sodium chloride infusion   Intravenous Continuous Darden Palmer, MD 100 mL/hr at 02/09/13 1006     Current Outpatient Prescriptions  Medication Sig Dispense Refill  . amitriptyline-chlordiazePOXIDE (LIMBITROL) 12.5-5 MG per tablet Take 1 tablet by mouth at bedtime.      . Calcium Carbonate (CALCIUM 500 PO) Take 1 tablet by mouth daily.      Marland Kitchen  clopidogrel (PLAVIX) 75 MG tablet Take 1 tablet (75 mg total) by mouth daily before breakfast.  30 tablet  5  . glimepiride (AMARYL) 4 MG tablet Take 2 mg by mouth daily before breakfast.        . HYDROcodone-acetaminophen (VICODIN) 5-500 MG per tablet Take 1 tablet by mouth every 6 (six) hours as needed. For pain.      . Lactobacillus (ACIDOPHILUS PO) Take 460 mg by mouth daily.      Marland Kitchen latanoprost (XALATAN) 0.005 % ophthalmic solution Place 1 drop into the left eye at bedtime.       . lovastatin (MEVACOR) 20 MG tablet Take 20 mg by mouth every evening.      . metFORMIN (GLUCOPHAGE) 1000 MG tablet Take 1,000 mg by mouth 2 (two) times daily.       . Meth-Hyo-M Bl-Na Phos-Ph Sal (URIBEL) 118 MG CAPS Take 1  capsule by mouth 4 (four) times daily as needed (usually takes 3 times daily for urinary urgency).      . methenamine (HIPREX) 1 G tablet Take 1 g by mouth QID.      Marland Kitchen metoprolol (LOPRESSOR) 50 MG tablet Take 0.5 tablets (25 mg total) by mouth 2 (two) times daily.  60 tablet  11  . Multiple Vitamin (MULITIVITAMIN WITH MINERALS) TABS Take 1 tablet by mouth daily.      . Multiple Vitamins-Minerals (ICAPS PO) Take 1 capsule by mouth daily.      . niacin 500 MG tablet Take 500 mg by mouth 2 (two) times daily.      Bertram Gala Glycol-Propyl Glycol (SYSTANE OP) Apply 1-2 drops to eye as needed. For eye irritation      . predniSONE (DELTASONE) 5 MG tablet Take 5 mg by mouth daily with breakfast.       . raloxifene (EVISTA) 60 MG tablet Take 60 mg by mouth daily.        . ranitidine (ZANTAC) 300 MG tablet Take 150 mg by mouth 2 (two) times daily.      . timolol (TIMOPTIC) 0.5 % ophthalmic solution Place 1 drop into both eyes daily.       Marland Kitchen trimethoprim (TRIMPEX) 100 MG tablet Take 100 mg by mouth at bedtime.       Marland Kitchen estradiol (ESTRING) 2 MG vaginal ring Place 2 mg vaginally every 3 (three) months. follow package directions      . nitroGLYCERIN (NITROSTAT) 0.4 MG SL tablet Place 0.4 mg under the tongue every 5 (five) minutes as needed. For chest pain.      . Vitamin D, Ergocalciferol, (DRISDOL) 50000 UNITS CAPS Take 50,000 Units by mouth every 14 (fourteen) days.      . [DISCONTINUED] CALCIUM PO Take by mouth.        patient does NOT take amitriptyline   Allergies: Allergies as of 02/09/2013 - Review Complete 02/09/2013  Allergen Reaction Noted  . Codeine Other (See Comments) 01/23/2010   Past Medical History  Diagnosis Date  . Pacemaker   . CAD (coronary artery disease)   . CHB (complete heart block)   . HTN (hypertension), benign   . DM (diabetes mellitus)   . Hypertrophic cardiomyopathy   . Hyperlipidemia   . OP (osteoporosis)   . Hx: UTI (urinary tract infection)   . Fatigue   .  Rheumatoid arthritis(714.0)   . Chronic steroid use   . Confusion   . Pancreatic pseudocyst   . Diverticulosis   . Shortness of breath   .  Bruises easily   . GERD (gastroesophageal reflux disease)   . History of IBS   . Renal calculus     has stent  . H/O unilateral nephrectomy   . Blood transfusion   . Difficulty sleeping     takes nightly med for sleep  . Complication of anesthesia     CONFUSION   Past Surgical History  Procedure Laterality Date  . Cardiac catheterization  04/17/2005    EF 65%  . Septal myectomy    . Nephrectomy      RIGHT  . Arm fracture surgery      RIGHT ARM  . Coronary artery bypass graft  04/2005    LIMA GRAFT TO THE DISTAL LAD, SAPHENOUS VEIN GRAFT TO THE DIAGONAL, SAPHENOUS VEIN GRAFT TO THE INTERMEDIATE BRANCH, AND SAPHENOUS VEIN GRAFT TO THE DISTAL RIGHT CORONARY  . Myomectomy    . Cataract extraction, bilateral    . Cystoscopy  05/07/2011    Procedure: CYSTOSCOPY;  Surgeon: Crecencio Mc, MD;  Location: [facility] OR;  Service: Urology;  Laterality: Left;  . Insert / replace / remove pacemaker  2006  . Cystoscopy/retrograde/ureteroscopy/stone extraction with basket  07/17/2011    Procedure: CYSTOSCOPY/RETROGRADE/URETEROSCOPY/STONE EXTRACTION WITH BASKET;  Surgeon: Kathi Ludwig, MD;  Location: [facility] ORS;  Service: Urology;  Laterality: Left;  Cystoscopy, Left Ureteroscopy, Retrograde Pyelogram, Stone Extraction, Holmium Laser Lithotripsy, Stent Exchange with Polaris Stent    . Cystoscopy w/ ureteral stent placement  07/17/2011    Procedure: CYSTOSCOPY WITH STENT REPLACEMENT;  Surgeon: Kathi Ludwig, MD;  Location: [facility] OR[facility];  Service: Urology;  Laterality: Left;  USE POLARIS STENT   Family History  Problem Relation Age of Onset  . Lung cancer Mother   . Cancer Father   . Heart attack Brother    History   Social History  . Marital Status: Widowed    Spouse Name: N/A    Number of Children: 1  . Years of Education: N/A   Occupational History  .      Social History Main Topics  . Smoking status: Former Smoker -- 1.00 packs/day    Types: Cigarettes    Quit date: 05/07/1990  . Smokeless tobacco: Never Used  . Alcohol Use: No  . Drug Use: No  . Sexual Activity: Not on file   Other Topics Concern  . Not on file   Social History Narrative  . Lives with son in [place]. Son helps with medications. Lives in one story house, no stairs. She is able to ambulate without assistance. She does her own ADLs.   Review of Systems: General: no fevers, chills, changes in weight, changes in appetite Skin: no rash HEENT: +cough, no ST, nasal congestion Pulm: no dyspnea, coughing, wheezing, though "becomes winded with walking" CV: no chest pain, palpitations, shortness of breath Abd: one episode of diarrhea early this AM; no abdominal pain, nausea/vomiting, constipation GU: see HPI Ext: no arthralgias, myalgias Neuro: no weakness, numbness, or tingling  Physical Exam: Blood pressure 153/68, pulse 89, temperature 98 F (36.7 C), temperature source Oral, resp. rate 18, SpO2 95.00%. ROOM AIR General: elderly woman, alert, cooperative, and in no apparent distress HEENT: pupils equal round and reactive to light, vision grossly intact, oropharynx clear and non-erythematous, MMM Neck: supple, no lymphadenopathy Lungs: scattered coarse lung sounds throughout, though unclear if these were transmitted upper airway sounds, normal work of respiration, no wheezes, rales Heart: regular rate and rhythm, 2-3/6 systolic murmur, no gallops, or rubs Abdomen: soft, non-tender, non-distended,  normal bowel sounds Extremities: warm extremities bilaterally, no BLE edema; multiple contusions to bilateral upper and lower extremities Neurologic: alert & oriented to place and self, not time; cranial nerves II-XII intact, strength 5/5 throughout, sensation intact to light touch, normal finger to nose and heel to shin. Did not assess gait.  Lab results: Basic Metabolic  Panel:  Recent Labs  02/09/13 0332  NA 132*  K 3.9  CL 97  CO2 20  GLUCOSE 96  BUN 16  CREATININE 1.05  CALCIUM 7.8*   CBC:  Recent Labs  02/09/13 0332  WBC 5.4  NEUTROABS 4.1  HGB 12.1  HCT 35.7*  MCV 88.1  PLT 151   CBG:  Recent Labs  02/09/13 0212 02/09/13 0420 02/09/13 0714 02/09/13 0817  GLUCAP 122* 100* 51* 155*   Urinalysis:  Recent Labs  02/09/13 0328  COLORURINE GREEN*  LABSPEC 1.023  PHURINE 7.0  GLUCOSEU NEGATIVE  HGBUR TRACE*  BILIRUBINUR NEGATIVE  KETONESUR NEGATIVE  PROTEINUR 30*  UROBILINOGEN 0.2  NITRITE NEGATIVE  LEUKOCYTESUR NEGATIVE    Imaging results:  Dg Chest 2 View  02/09/2013   CLINICAL DATA:  Cough, confusion  EXAM: CHEST  2 VIEW  COMPARISON:  09/06/2012  FINDINGS: Cardiomediastinal silhouette is stable. Dual lead cardiac pacemaker in place. Central mild bronchitic changes. Small left pleural effusion with left basilar atelectasis or infiltrate. Osteopenia and mild degenerative changes thoracic spine. Atherosclerotic calcifications of thoracic aorta.  IMPRESSION: Central mild bronchitic changes. Small left pleural effusion with left basilar atelectasis or infiltrate.   Electronically Signed   By: Natasha Mead M.D.   On: 02/09/2013 09:37   Other results: EKG: NSR, LAD w/ LBBB, old T wave inversion in V6. No ischemic changes.  Assessment & Plan by Problem: This is a 79yo F w/ PMH DM type 2, nephrolithiasis s/p L J stent placement w/ recurrent UTIs, CAD s/p CABG, HTN, HOCM, and complete HB w/ pacemaker who presents w/ hypoglycemia and found to have UTI.  # Hypoglycemia in the setting Type 2 DM: Most likely 2/2 to hypoglycemic medications as pt on sulfonylurea, which can cause hypoglycemia (duration 24hrs). Hypoglycemia due to insulin secretagogues is more common when patient's immune system is either compromised or active infection is present. Pt is on chronic prednisone and has a UTI, so she meets both of these criteria. Patient's  last A1c is 6.8%, though this was in April 2013. Therefore, patient may be over treated for her DM and may not require both amaryl and metformin. Malnutrition can also cause increased risk of hypoglycemia. Son thinks patient has been eating normally, however this should also be considered as patient is rather thin. -admit to telemetry -D5NS at 100cc/hr -hold metformin and amaryl; will add SSI if becomes hyperglycemic  -CBGs q2h -EKG in AM -HbA1c, CMP in AM -PT/OT  # UTI:  Patient with dirty UA on admission. UCx pending. Patient has h/o J stent to L ureter w/ recurrent UTIs (Klebsiella isolated last year per chart review, though all 4 UCx we have in EPIC are wnl). Per son, patient usually responds well to cipro. Pt takes trimethoprim and methenamine (Hiprex) for UTI prophylaxis as well as Uribel for symptom management. Pt is symptomatic, so will treat. -continue uribel, hiprex -d/c trimethoprim for now -levaquin 500mg  q24h for total 7-14 days (dosing adjusted for possible concurrent PNA; consider decreasing dose to 250mg  qdaily for renal adjustment if needed)  # Nonproductive cough, with suspected CAP: Symptoms only for last 2-3 days. Patient's lungs sounded  coarse throughout, though it was unclear if these were upper airway sounds. We checked CXR, which showed small left pleural effusion with possible L basilar infiltrate (new since 05/2012). DDx includes CHF given patient's extensive cardiac history (HOCM, CAD s/o CABG, complete HB w/ pacer), though patient's story is less concerning for CHF and more consistent with PNA or URI. Pt had echo 04/2011, which showed normal EF, elevated PA pressure and ? AS. PE unlikely given not tachycardic, no hypoxia, preceding URI symptoms, and low modified geneva score (1).  -robitussin DM prn -cycle troponins x 3 -abx as above  # CAD s/p CABG in 2007: Patient stable and without CP or SOB. EKG relatively unchanged from prior. No concern for ACS at this  time. -contine plavix -cycle trops as above  # HTN: Stable. -continue lopressor 25mg  BID  #HLD: Stable. Last LDL 95 in 2012.  -Continue niacin and statin  #RA: Stable, asymptomatic. Continue prednisone.   # VTE: heparin  Code status: Full  Dispo: Disposition is deferred at this time, awaiting improvement of current medical problems. Anticipated discharge in approximately 1-2 day(s).   The patient does have a current PCP Andrey Campanile Elizebeth Koller, MD) and does not need an [facility] hospital follow-up appointment after discharge.  The patient does not have transportation limitations that hinder transportation to clinic appointments.  Signed: Windell Hummingbird, MD 02/09/2013, 10:25 AM

## 2013-02-09 NOTE — ED Provider Notes (Signed)
CSN: [id number]     Arrival date & time 02/09/13  0202 History   First MD Initiated Contact with Patient 02/09/13 0250     Chief Complaint  Patient presents with  . Hypoglycemia   (Consider location/radiation/quality/duration/timing/severity/associated sxs/prior Treatment) Patient is a 79 y.o. female presenting with hypoglycemia. The history is provided by the patient and a relative.  Hypoglycemia She was noted to be restless in bed and her son thought that she might have been having bad dream. He tried to wake her up but she was unarousable so he called EMS. EMS arrived and noted a blood sugar was 44 and they gave her some dextrose and with significant improvement and transported her here. Her son states that she has had previous episodes of hypoglycemia about 2 years ago. She has not had any fever or chills or nausea or vomiting or diarrhea.  Past Medical History  Diagnosis Date  . Pacemaker   . CAD (coronary artery disease)   . CHB (complete heart block)   . HTN (hypertension), benign   . DM (diabetes mellitus)   . Hypertrophic cardiomyopathy   . Hyperlipidemia   . OP (osteoporosis)   . Hx: UTI (urinary tract infection)   . Fatigue   . Rheumatoid arthritis(714.0)   . Chronic steroid use   . Confusion   . Pancreatic pseudocyst   . Diverticulosis   . Shortness of breath   . Bruises easily   . GERD (gastroesophageal reflux disease)   . History of IBS   . Renal calculus     has stent  . H/O unilateral nephrectomy   . Blood transfusion   . Difficulty sleeping     takes nightly med for sleep  . Complication of anesthesia     CONFUSION   Past Surgical History  Procedure Laterality Date  . Cardiac catheterization  04/17/2005    EF 65%  . Septal myectomy    . Nephrectomy      RIGHT  . Arm fracture surgery      RIGHT ARM  . Coronary artery bypass graft  04/2005    LIMA GRAFT TO THE DISTAL LAD, SAPHENOUS VEIN GRAFT TO THE DIAGONAL, SAPHENOUS VEIN GRAFT TO THE INTERMEDIATE  BRANCH, AND SAPHENOUS VEIN GRAFT TO THE DISTAL RIGHT CORONARY  . Myomectomy    . Cataract extraction, bilateral    . Cystoscopy  05/07/2011    Procedure: CYSTOSCOPY;  Surgeon: Crecencio Mc, MD;  Location: [facility] OR;  Service: Urology;  Laterality: Left;  . Insert / replace / remove pacemaker  2006  . Cystoscopy/retrograde/ureteroscopy/stone extraction with basket  07/17/2011    Procedure: CYSTOSCOPY/RETROGRADE/URETEROSCOPY/STONE EXTRACTION WITH BASKET;  Surgeon: Kathi Ludwig, MD;  Location: [facility] ORS;  Service: Urology;  Laterality: Left;  Cystoscopy, Left Ureteroscopy, Retrograde Pyelogram, Stone Extraction, Holmium Laser Lithotripsy, Stent Exchange with Polaris Stent    . Cystoscopy w/ ureteral stent placement  07/17/2011    Procedure: CYSTOSCOPY WITH STENT REPLACEMENT;  Surgeon: Kathi Ludwig, MD;  Location: [facility] ORS;  Service: Urology;  Laterality: Left;  USE POLARIS STENT   Family History  Problem Relation Age of Onset  . Lung cancer Mother   . Cancer Father   . Heart attack Brother    History  Substance Use Topics  . Smoking status: Former Smoker -- 1.00 packs/day    Types: Cigarettes    Quit date: 05/07/1990  . Smokeless tobacco: Never Used  . Alcohol Use: No   OB History   Grav Para Term  Preterm Abortions TAB SAB Ect Mult Living                 Review of Systems  All other systems reviewed and are negative.    Allergies  Codeine  Home Medications   Current Outpatient Rx  Name  Route  Sig  Dispense  Refill  . amitriptyline-chlordiazePOXIDE (LIMBITROL) 12.5-5 MG per tablet   Oral   Take 1 tablet by mouth at bedtime.         . Calcium Carbonate (CALCIUM 500 PO)   Oral   Take 1 tablet by mouth daily.         . clopidogrel (PLAVIX) 75 MG tablet   Oral   Take 1 tablet (75 mg total) by mouth daily before breakfast.   30 tablet   5   . glimepiride (AMARYL) 4 MG tablet   Oral   Take 2 mg by mouth daily before breakfast.           .  HYDROcodone-acetaminophen (VICODIN) 5-500 MG per tablet   Oral   Take 1 tablet by mouth every 6 (six) hours as needed. For pain.         . Lactobacillus (ACIDOPHILUS PO)   Oral   Take 460 mg by mouth daily.         Marland Kitchen latanoprost (XALATAN) 0.005 % ophthalmic solution   Left Eye   Place 1 drop into the left eye at bedtime.          . lovastatin (MEVACOR) 20 MG tablet   Oral   Take 20 mg by mouth every evening.         . metFORMIN (GLUCOPHAGE) 1000 MG tablet   Oral   Take 1,000 mg by mouth 2 (two) times daily.          . metoprolol (LOPRESSOR) 50 MG tablet   Oral   Take 0.5 tablets (25 mg total) by mouth 2 (two) times daily.   60 tablet   11   . Multiple Vitamin (MULITIVITAMIN WITH MINERALS) TABS   Oral   Take 1 tablet by mouth daily.         . Multiple Vitamins-Minerals (ICAPS PO)   Oral   Take 1 capsule by mouth daily.         . niacin 500 MG tablet   Oral   Take 500 mg by mouth 2 (two) times daily.         Bertram Gala Glycol-Propyl Glycol (SYSTANE OP)   Ophthalmic   Apply 1-2 drops to eye as needed. For eye irritation         . predniSONE (DELTASONE) 5 MG tablet   Oral   Take 5 mg by mouth daily with breakfast.          . raloxifene (EVISTA) 60 MG tablet   Oral   Take 60 mg by mouth daily.           . ranitidine (ZANTAC) 300 MG tablet   Oral   Take 150 mg by mouth 2 (two) times daily.         . timolol (TIMOPTIC) 0.5 % ophthalmic solution   Both Eyes   Place 1 drop into both eyes daily.          Marland Kitchen trimethoprim (TRIMPEX) 100 MG tablet   Oral   Take 100 mg by mouth at bedtime.          Marland Kitchen estradiol (ESTRING) 2 MG vaginal ring  Vaginal   Place 2 mg vaginally every 3 (three) months. follow package directions         . nitroGLYCERIN (NITROSTAT) 0.4 MG SL tablet   Sublingual   Place 0.4 mg under the tongue every 5 (five) minutes as needed. For chest pain.         . Vitamin D, Ergocalciferol, (DRISDOL) 50000 UNITS CAPS    Oral   Take 50,000 Units by mouth every 14 (fourteen) days.          BP 147/63  Pulse 72  Temp(Src) 98 F (36.7 C) (Oral)  Resp 14  SpO2 94% Physical Exam  Nursing note and vitals reviewed.  79 year old female, resting comfortably and in no acute distress. Vital signs are significant for hypertension with blood pressure 147/63. Oxygen saturation is 94%, which is normal. Head is normocephalic and atraumatic. PERRLA, EOMI. Oropharynx is clear. Neck is nontender and supple without adenopathy or JVD. Back is nontender and there is no CVA tenderness. Lungs are clear without rales, wheezes, or rhonchi. Chest is nontender. Heart has regular rate and rhythm with 3/6 holosystolic murmur. Abdomen is soft, flat, nontender without masses or hepatosplenomegaly and peristalsis is normoactive. Extremities have no cyanosis or edema, full range of motion is present. Skin is warm and dry without rash. Neurologic: She is awake, alert, oriented to person and place and mostly oriented to time but is not aware of the year and thinks that Christmas is coming soon, cranial nerves are intact, there are no motor or sensory deficits.  ED Course  Procedures (including critical care time) Labs Review Results for orders placed during the hospital encounter of 02/09/13  CBC WITH DIFFERENTIAL      Result Value Range   WBC 5.4  4.0 - 10.5 K/uL   RBC 4.05  3.87 - 5.11 MIL/uL   Hemoglobin 12.1  12.0 - 15.0 g/dL   HCT 40.9 (*) 81.1 - 91.4 %   MCV 88.1  78.0 - 100.0 fL   MCH 29.9  26.0 - 34.0 pg   MCHC 33.9  30.0 - 36.0 g/dL   RDW 78.2  95.6 - 21.3 %   Platelets 151  150 - 400 K/uL   Neutrophils Relative % 77  43 - 77 %   Neutro Abs 4.1  1.7 - 7.7 K/uL   Lymphocytes Relative 8 (*) 12 - 46 %   Lymphs Abs 0.4 (*) 0.7 - 4.0 K/uL   Monocytes Relative 15 (*) 3 - 12 %   Monocytes Absolute 0.8  0.1 - 1.0 K/uL   Eosinophils Relative 0  0 - 5 %   Eosinophils Absolute 0.0  0.0 - 0.7 K/uL   Basophils Relative 0  0  - 1 %   Basophils Absolute 0.0  0.0 - 0.1 K/uL  BASIC METABOLIC PANEL      Result Value Range   Sodium 132 (*) 137 - 147 mEq/L   Potassium 3.9  3.7 - 5.3 mEq/L   Chloride 97  96 - 112 mEq/L   CO2 20  19 - 32 mEq/L   Glucose, Bld 96  70 - 99 mg/dL   BUN 16  6 - 23 mg/dL   Creatinine, Ser 0.86  0.50 - 1.10 mg/dL   Calcium 7.8 (*) 8.4 - 10.5 mg/dL   GFR calc non Af Amer 47 (*) >90 mL/min   GFR calc Af Amer 54 (*) >90 mL/min  URINALYSIS, ROUTINE W REFLEX MICROSCOPIC      Result  Value Range   Color, Urine GREEN (*) YELLOW   APPearance CLOUDY (*) CLEAR   Specific Gravity, Urine 1.023  1.005 - 1.030   pH 7.0  5.0 - 8.0   Glucose, UA NEGATIVE  NEGATIVE mg/dL   Hgb urine dipstick TRACE (*) NEGATIVE   Bilirubin Urine NEGATIVE  NEGATIVE   Ketones, ur NEGATIVE  NEGATIVE mg/dL   Protein, ur 30 (*) NEGATIVE mg/dL   Urobilinogen, UA 0.2  0.0 - 1.0 mg/dL   Nitrite NEGATIVE  NEGATIVE   Leukocytes, UA NEGATIVE  NEGATIVE  GLUCOSE, CAPILLARY      Result Value Range   Glucose-Capillary 122 (*) 70 - 99 mg/dL  GLUCOSE, CAPILLARY      Result Value Range   Glucose-Capillary 100 (*) 70 - 99 mg/dL  URINE MICROSCOPIC-ADD ON      Result Value Range   Squamous Epithelial / LPF FEW (*) RARE   WBC, UA TOO NUMEROUS TO COUNT  <3 WBC/hpf   Bacteria, UA RARE  RARE  GLUCOSE, CAPILLARY      Result Value Range   Glucose-Capillary 51 (*) 70 - 99 mg/dL   MDM   1. Hypoglycemia   2. Urinary tract infection   3. Hyponatremia   4. Hypocalcemia    Hypoglycemia which has been treated by EMS before arriving to the hospital. After arrival, she was given a sandwich to eat to maintain her blood sugar. Screening labs will be obtained as well as urinalysis to look for evidence of occult infection. Old records are reviewed and she was admitted twice for hypoglycemia in 2013. She will need to have repeat blood sugars checked because hypoglycemia occurred while taking a sulfonylurea.  Urinalysis is positive for UTI and  she is given a dose of ceftriaxone. Blood sugar dropped back down to 51. She's given additional dextrose intravenously and arrangements are made to admit the patient. Case has been discussed with resident on call for internal medicine teaching service who has agreed to admit the patient.    Dione Booze, MD 02/09/13 [phone]

## 2013-02-09 NOTE — Progress Notes (Signed)
Utilization Review Completed.Dorcas Carrow T12/31/2014

## 2013-02-10 DIAGNOSIS — E119 Type 2 diabetes mellitus without complications: Secondary | ICD-10-CM

## 2013-02-10 DIAGNOSIS — N390 Urinary tract infection, site not specified: Secondary | ICD-10-CM

## 2013-02-10 DIAGNOSIS — E162 Hypoglycemia, unspecified: Secondary | ICD-10-CM

## 2013-02-10 LAB — TSH: TSH: 0.3 u[IU]/mL — ABNORMAL LOW (ref 0.350–4.500)

## 2013-02-10 LAB — GLUCOSE, CAPILLARY
GLUCOSE-CAPILLARY: 103 mg/dL — AB (ref 70–99)
GLUCOSE-CAPILLARY: 128 mg/dL — AB (ref 70–99)
GLUCOSE-CAPILLARY: 244 mg/dL — AB (ref 70–99)
Glucose-Capillary: 113 mg/dL — ABNORMAL HIGH (ref 70–99)
Glucose-Capillary: 82 mg/dL (ref 70–99)
Glucose-Capillary: 106 mg/dL — ABNORMAL HIGH (ref 70–99)
Glucose-Capillary: 100 mg/dL — ABNORMAL HIGH (ref 70–99)
Glucose-Capillary: 92 mg/dL (ref 70–99)
Glucose-Capillary: 134 mg/dL — ABNORMAL HIGH (ref 70–99)
Glucose-Capillary: 140 mg/dL — ABNORMAL HIGH (ref 70–99)
Glucose-Capillary: 260 mg/dL — ABNORMAL HIGH (ref 70–99)

## 2013-02-10 LAB — HEMOGLOBIN A1C
Hgb A1c MFr Bld: 5.8 % — ABNORMAL HIGH (ref <5.7)
Mean Plasma Glucose: 120 mg/dL — ABNORMAL HIGH (ref <117)

## 2013-02-10 LAB — COMPREHENSIVE METABOLIC PANEL
ALT: 14 U/L (ref 0–35)
AST: 30 U/L (ref 0–37)
Albumin: 2.9 g/dL — ABNORMAL LOW (ref 3.5–5.2)
Alkaline Phosphatase: 37 U/L — ABNORMAL LOW (ref 39–117)
BUN: 11 mg/dL (ref 6–23)
CO2: 18 mEq/L — ABNORMAL LOW (ref 19–32)
Calcium: 7.6 mg/dL — ABNORMAL LOW (ref 8.4–10.5)
Chloride: 102 mEq/L (ref 96–112)
Creatinine, Ser: 0.92 mg/dL (ref 0.50–1.10)
GFR calc Af Amer: 64 mL/min — ABNORMAL LOW (ref >90)
GFR calc non Af Amer: 55 mL/min — ABNORMAL LOW (ref >90)
Glucose, Bld: 105 mg/dL — ABNORMAL HIGH (ref 70–99)
Potassium: 3.9 mEq/L (ref 3.7–5.3)
Sodium: 135 mEq/L — ABNORMAL LOW (ref 137–147)
Total Bilirubin: 0.3 mg/dL (ref 0.3–1.2)
Total Protein: 5.3 g/dL — ABNORMAL LOW (ref 6.0–8.3)

## 2013-02-10 LAB — URINE CULTURE: Colony Count: 40000

## 2013-02-10 LAB — VITAMIN B12: Vitamin B-12: 675 pg/mL (ref 211–911)

## 2013-02-10 MED ORDER — LEVOFLOXACIN 500 MG PO TABS: 500.0000 mg | ORAL_TABLET | Freq: Every day | ORAL | Status: DC

## 2013-02-10 MED ORDER — URIBEL 118 MG PO CAPS: 1.0000 | ORAL_CAPSULE | Freq: Three times a day (TID) | ORAL | Status: DC

## 2013-02-10 MED ORDER — SODIUM CHLORIDE 0.9 % IV SOLN
INTRAVENOUS | Status: DC
  Administered 2013-02-10: 03:00:00 via INTRAVENOUS

## 2013-02-10 MED ORDER — NONFORMULARY OR COMPOUNDED ITEM
1.0000 | Freq: Three times a day (TID) | Status: DC
  Administered 2013-02-10: 16:00:00 1 via ORAL
  Filled 2013-02-10: qty 1

## 2013-02-10 MED ORDER — LEVOFLOXACIN 500 MG PO TABS
500.0000 mg | ORAL_TABLET | Freq: Every day | ORAL | Status: DC
  Administered 2013-02-10: 500 mg via ORAL
  Filled 2013-02-10: qty 1

## 2013-02-10 NOTE — Evaluation (Signed)
Physical Therapy Evaluation Patient Details Name: [name] MRN: [id number] DOB: 14-Nov-1924 Today's Date: 02/10/2013 Time: 6237-6283 PT Time Calculation (min): 30 min  PT Assessment / Plan / Recommendation History of Present Illness  80 y.o. female admitted to [facility] on 02/09/13 with PMH of DM type 2 (last A1c 6.8 05/2011), HTN, HOCM s/p septal myectomy, RA on chronic steroids, GERD, nephrolithiasis s/p L J stent, R nephrectomy, CAD s/p CABG 2007, complete HB w/ pacemaker who presents with hypoglycemia.    Clinical Impression  Pt is generally weak and unsteady on her feet.  Son provides 24/7 care at home (even sleeps in the same room with her at night to make sure she doesn't get up on her own).  She would benefit from HHPT and a WC for safe d/c home.   PT to follow acutely for deficits listed below.       PT Assessment  Patient needs continued PT services    Follow Up Recommendations  Home health PT;Supervision/Assistance - 24 hour    Does the patient have the potential to tolerate intense rehabilitation     NA  Barriers to Discharge   None      Equipment Recommendations  Wheelchair (measurements PT);Wheelchair cushion (measurements PT)    Recommendations for Other Services   None  Frequency Min 3X/week    Precautions / Restrictions Precautions Precautions: Fall   Pertinent Vitals/Pain See vitals flow sheet.       Mobility  Bed Mobility Bed Mobility: Supine to Sit;Sit to Supine;Sitting - Scoot to Edge of Bed Supine to Sit: 3: Mod assist Sitting - Scoot to Edge of Bed: 4: Min assist Sit to Supine: 3: Mod assist Details for Bed Mobility Assistance: mod assist to support trunk and help with legs to get to sitting and to help bil legs to get to supine.  Min assist to support trunk while scooting to EOB.  Railing used on bed.   Transfers Transfers: Sit to Stand;Stand to Sit Sit to Stand: 4: Min assist;From bed Stand to Sit: 4: Min assist;To bed Details for Transfer  Assistance: min assist to support pt's trunk during transitions for balance.   Ambulation/Gait Ambulation/Gait Assistance: 4: Min assist Ambulation Distance (Feet): 200 Feet Assistive device: 1 person hand held assist Ambulation/Gait Assistance Details: min assist for balance as pt has staggering gait pattern and is at risk for falling.   Gait Pattern: Step-through pattern;Shuffle (staggering. ) Gait velocity: decreased        PT Diagnosis: Difficulty walking;Abnormality of gait;Generalized weakness  PT Problem List: Decreased strength;Decreased activity tolerance;Decreased balance;Decreased mobility;Decreased cognition PT Treatment Interventions: DME instruction;Gait training;Stair training;Functional mobility training;Therapeutic activities;Therapeutic exercise;Balance training;Cognitive remediation;Neuromuscular re-education;Patient/family education     PT Goals(Current goals can be found in the care plan section) Acute Rehab PT Goals Patient Stated Goal: to go home PT Goal Formulation: With patient/family Time For Goal Achievement: 02/24/13 Potential to Achieve Goals: Good  Visit Information  Last PT Received On: 02/10/13 Assistance Needed: +1 History of Present Illness: 80 y.o. female admitted to [facility] on 02/09/13 with PMH of DM type 2 (last A1c 6.8 05/2011), HTN, HOCM s/p septal myectomy, RA on chronic steroids, GERD, nephrolithiasis s/p L J stent, R nephrectomy, CAD s/p CABG 2007, complete HB w/ pacemaker who presents with hypoglycemia.         Prior Functioning  Home Living Family/patient expects to be discharged to:: Private residence Living Arrangements: Children Available Help at Discharge: Family;Friend(s) Type of Home: House Home Access: Level entry  Home Layout: One level Home Equipment: Tub bench;Hand held shower head Prior Function Level of Independence: Independent Comments: son is worried about her memory.  Communication Communication: No difficulties     Cognition  Cognition Arousal/Alertness: Awake/alert Behavior During Therapy: Restless Overall Cognitive Status: History of cognitive impairments - at baseline Memory: Decreased short-term memory (per son her memory has been getting worse over the past year)    Extremity/Trunk Assessment Upper Extremity Assessment Upper Extremity Assessment: Defer to OT evaluation Lower Extremity Assessment Lower Extremity Assessment: Generalized weakness Cervical / Trunk Assessment Cervical / Trunk Assessment: Normal   Balance Balance Balance Assessed: Yes Static Sitting Balance Static Sitting - Balance Support: Feet supported Static Sitting - Level of Assistance: 5: Stand by assistance Static Standing Balance Static Standing - Balance Support: Left upper extremity supported Static Standing - Level of Assistance: 4: Min assist Dynamic Standing Balance Dynamic Standing - Balance Support: Left upper extremity supported Dynamic Standing - Level of Assistance: 4: Min assist  End of Session PT - End of Session Equipment Utilized During Treatment: Gait belt Activity Tolerance: Patient tolerated treatment well Patient left: in bed;with call bell/phone within reach;with family/visitor present    Lurena Joiner B. Cono Gebhard, PT, DPT [phone]   02/10/2013, 5:43 PM

## 2013-02-10 NOTE — H&P (Signed)
Internal Medicine Attending Admission Note Date: 02/10/2013  Patient name: [name] Medical record number: [id number] Date of birth: Dec 26, 1924 Age: 80 y.o. Gender: female  I saw and evaluated the patient. I reviewed the resident's note and I agree with the resident's findings and plan as documented in the resident's note.  [name] is an 80 year old woman with a history of diabetes, hypertension, coronary artery disease, and rheumatoid arthritis on chronic low-dose prednisone who presents with hypoglycemia. She was in her usual state of health until the night of admission when her son noted that she was making unusual noises while sleeping. He tried to wake her up but she was unarousable. He therefore called 911 and EMS found her blood glucose to be 44. She received an amp of D50 with a rise in her glucose. At that point her mental status improved and she was brought to the emergency department. While there her glucose fell again and she received another amp of D50. She was admitted to the internal medicine teaching service for further evaluation.  Her son states she's been compliant with both her Amaryl and metformin. The last hemoglobin A1c we have on record is from 2013 and it was 6.8%. She's had at least 2 episodes of hypoglycemia in the past requiring admission to the hospital. Given her age and underlying comorbid conditions a hemoglobin A1c of less than 8% may be a more realistic target for her. Overnight she is required no further intravenous dextrose and had blood glucoses in the 100 range. We are waiting for a hemoglobin A1c but I fully expect it to be well under 8%. If this is the case she will be discharged with no further Amaryl and also be discharged on a lower dose of metformin if any at all.  She had a symptomatic urinary tract infection and this has been treated with antibiotics. Her son also mentioned a slight worsening in her memory over the last year while we were rounding today.  We discussed the pluses and minuses of anticholinergic therapy for dementia and decided it might be best to check for easily reversible causes including hypothyroidism and B12 deficiency before pursuing any other evaluation or treatment. These results are also pending at this time.  If she does well and we have further information on her hemoglobin A1c to guide our therapy she will likely be ready for discharge home today.

## 2013-02-10 NOTE — Discharge Instructions (Signed)
Please take the antibiotic (levaquin 500mg  daily for a total of 5 more days after your discharge) starting 02/11/13.   DO NOT take metformin or amaryl. You will need a hemoglobin A1c checked again in 3 months.  Please follow up with your PCP within 2 weeks. You will need to call your PCP to make this appointment as you were discharged on a holiday, so I was unable to make this appointment for you. Please address your memory issues at this appointment. You have a vitamin B12 level and TSH level pending. Please ask your PCP to follow up on this. I will send him your discharge paper work.  Pneumonia, Adult Pneumonia is an infection of the lungs.  CAUSES Pneumonia may be caused by bacteria or a virus. Usually, these infections are caused by breathing infectious particles into the lungs (respiratory tract). SYMPTOMS   Cough.  Fever.  Chest pain.  Increased rate of breathing.  Wheezing.  Mucus production. DIAGNOSIS  If you have the common symptoms of pneumonia, your caregiver will typically confirm the diagnosis with a chest X-ray. The X-ray will show an abnormality in the lung (pulmonary infiltrate) if you have pneumonia. Other tests of your blood, urine, or sputum may be done to find the specific cause of your pneumonia. Your caregiver may also do tests (blood gases or pulse oximetry) to see how well your lungs are working. TREATMENT  Some forms of pneumonia may be spread to other people when you cough or sneeze. You may be asked to wear a mask before and during your exam. Pneumonia that is caused by bacteria is treated with antibiotic medicine. Pneumonia that is caused by the influenza virus may be treated with an antiviral medicine. Most other viral infections must run their course. These infections will not respond to antibiotics.  PREVENTION A pneumococcal shot (vaccine) is available to prevent a common bacterial cause of pneumonia. This is usually suggested for:  People over 3 years  old.  Patients on chemotherapy.  People with chronic lung problems, such as bronchitis or emphysema.  People with immune system problems. If you are over 65 or have a high risk condition, you may receive the pneumococcal vaccine if you have not received it before. In some countries, a routine influenza vaccine is also recommended. This vaccine can help prevent some cases of pneumonia.You may be offered the influenza vaccine as part of your care. If you smoke, it is time to quit. You may receive instructions on how to stop smoking. Your caregiver can provide medicines and counseling to help you quit. HOME CARE INSTRUCTIONS   Cough suppressants may be used if you are losing too much rest. However, coughing protects you by clearing your lungs. You should avoid using cough suppressants if you can.  Your caregiver may have prescribed medicine if he or she thinks your pneumonia is caused by a bacteria or influenza. Finish your medicine even if you start to feel better.  Your caregiver may also prescribe an expectorant. This loosens the mucus to be coughed up.  Only take over-the-counter or prescription medicines for pain, discomfort, or fever as directed by your caregiver.  Do not smoke. Smoking is a common cause of bronchitis and can contribute to pneumonia. If you are a smoker and continue to smoke, your cough may last several weeks after your pneumonia has cleared.  A cold steam vaporizer or humidifier in your room or home may help loosen mucus.  Coughing is often worse at night. Sleeping in  a semi-upright position in a recliner or using a couple pillows under your head will help with this.  Get rest as you feel it is needed. Your body will usually let you know when you need to rest. SEEK IMMEDIATE MEDICAL CARE IF:   Your illness becomes worse. This is especially true if you are elderly or weakened from any other disease.  You cannot control your cough with suppressants and are losing  sleep.  You begin coughing up blood.  You develop pain which is getting worse or is uncontrolled with medicines.  You have a fever.  Any of the symptoms which initially brought you in for treatment are getting worse rather than better.  You develop shortness of breath or chest pain. MAKE SURE YOU:   Understand these instructions.  Will watch your condition.  Will get help right away if you are not doing well or get worse. Document Released: 01/27/2005 Document Revised: 04/21/2011 Document Reviewed: 04/18/2010 [facility] Patient Information 2014 [place], [place]. Urinary Tract Infection Urinary tract infections (UTIs) can develop anywhere along your urinary tract. Your urinary tract is your body's drainage system for removing wastes and extra water. Your urinary tract includes two kidneys, two ureters, a bladder, and a urethra. Your kidneys are a pair of bean-shaped organs. Each kidney is about the size of your fist. They are located below your ribs, one on each side of your spine. CAUSES Infections are caused by microbes, which are microscopic organisms, including fungi, viruses, and bacteria. These organisms are so small that they can only be seen through a microscope. Bacteria are the microbes that most commonly cause UTIs. SYMPTOMS  Symptoms of UTIs may vary by age and gender of the patient and by the location of the infection. Symptoms in young women typically include a frequent and intense urge to urinate and a painful, burning feeling in the bladder or urethra during urination. Older women and men are more likely to be tired, shaky, and weak and have muscle aches and abdominal pain. A fever may mean the infection is in your kidneys. Other symptoms of a kidney infection include pain in your back or sides below the ribs, nausea, and vomiting. DIAGNOSIS To diagnose a UTI, your caregiver will ask you about your symptoms. Your caregiver also will ask to provide a urine sample. The urine  sample will be tested for bacteria and white blood cells. White blood cells are made by your body to help fight infection. TREATMENT  Typically, UTIs can be treated with medication. Because most UTIs are caused by a bacterial infection, they usually can be treated with the use of antibiotics. The choice of antibiotic and length of treatment depend on your symptoms and the type of bacteria causing your infection. HOME CARE INSTRUCTIONS  If you were prescribed antibiotics, take them exactly as your caregiver instructs you. Finish the medication even if you feel better after you have only taken some of the medication.  Drink enough water and fluids to keep your urine clear or pale yellow.  Avoid caffeine, tea, and carbonated beverages. They tend to irritate your bladder.  Empty your bladder often. Avoid holding urine for long periods of time.  Empty your bladder before and after sexual intercourse.  After a bowel movement, women should cleanse from front to back. Use each tissue only once. SEEK MEDICAL CARE IF:   You have back pain.  You develop a fever.  Your symptoms do not begin to resolve within 3 days. SEEK IMMEDIATE MEDICAL  CARE IF:   You have severe back pain or lower abdominal pain.  You develop chills.  You have nausea or vomiting.  You have continued burning or discomfort with urination. MAKE SURE YOU:   Understand these instructions.  Will watch your condition.  Will get help right away if you are not doing well or get worse. Document Released: 11/06/2004 Document Revised: 07/29/2011 Document Reviewed: 03/07/2011 [facility] Patient Information 2014 [place], [place].

## 2013-02-10 NOTE — Progress Notes (Addendum)
Subjective: Patient feels well this morning. Still has cough, no other complaints. Son expressed concern for patient's declining memory.    Objective: Vital signs in last 24 hours: Filed Vitals:   02/10/13 0203 02/10/13 0356 02/10/13 1050 02/10/13 1323  BP: 154/71 148/90 146/73 141/78  Pulse: 82 83 80 78  Temp: 98.9 F (37.2 C) 98.2 F (36.8 C)  97.9 F (36.6 C)  TempSrc: Oral Oral  Oral  Resp: 20 18  18   Height:      Weight:  59.36 kg (130 lb 13.8 oz)    SpO2: 93% 96%  95%  pt on room air  Weight change:   Intake/Output Summary (Last 24 hours) at 02/10/13 1405 Last data filed at 02/10/13 1328  Gross per 24 hour  Intake   1230 ml  Output    972 ml  Net    258 ml   Physical exam  General: elderly woman, alert, cooperative, and in no apparent distress; sitting upright in beside chair HEENT: pupils equal round and reactive to light, vision grossly intact, oropharynx clear and non-erythematous, MMM Neck: supple Lungs: scattered coarse lung sounds throughout, again, unclear if these were transmitted upper airway sounds, normal work of respiration, no wheezes, rales Heart: regular rate and rhythm, 2-3/6 systolic murmur, no gallops, or rubs Abdomen: soft, non-tender, non-distended, normal bowel sounds  Extremities: warm extremities bilaterally, no BLE edema; multiple contusions to bilateral upper and lower extremities Neurologic: alert & oriented to place and self, not time; cranial nerves II-XII grossly intact, moving all extremities spontaneously  Lab Results: Basic Metabolic Panel:  Recent Labs Lab 02/09/13 0332 02/10/13 0617  NA 132* 135*  K 3.9 3.9  CL 97 102  CO2 20 18*  GLUCOSE 96 105*  BUN 16 11  CREATININE 1.05 0.92  CALCIUM 7.8* 7.6*   Calcium corrected for albumin- 8.7  Liver Function Tests:  Recent Labs Lab 02/10/13 0617  AST 30  ALT 14  ALKPHOS 37*  BILITOT 0.3  PROT 5.3*  ALBUMIN 2.9*   CBC:  Recent Labs Lab 02/09/13 0332  WBC 5.4    NEUTROABS 4.1  HGB 12.1  HCT 35.7*  MCV 88.1  PLT 151   Cardiac Enzymes:  Recent Labs Lab 02/09/13 1135 02/09/13 1625 02/09/13 2200  TROPONINI <0.30 <0.30 <0.30   CBG:  Recent Labs Lab 02/10/13 0209 02/10/13 0404 02/10/13 0606 02/10/13 0745 02/10/13 1000 02/10/13 1131  GLUCAP 106* 100* 92 103* 134* 140*   Hemoglobin A1C: No results found for this basename: HGBA1C,  in the last 168 hours Thyroid Function Tests: No results found for this basename: TSH, T4TOTAL, FREET4, T3FREE, THYROIDAB,  in the last 168 hours TSH pending  Urinalysis:  Recent Labs Lab 02/09/13 0328  COLORURINE GREEN*  LABSPEC 1.023  PHURINE 7.0  GLUCOSEU NEGATIVE  HGBUR TRACE*  BILIRUBINUR NEGATIVE  KETONESUR NEGATIVE  PROTEINUR 30*  UROBILINOGEN 0.2  NITRITE NEGATIVE  LEUKOCYTESUR NEGATIVE   Misc. Labs: Vitamin B12 pending HbA1c pending  Micro Results: Recent Results (from the past 240 hour(s))  URINE CULTURE     Status: None   Collection Time    02/09/13  3:28 AM      Result Value Range Status   Specimen Description URINE, RANDOM   Final   Special Requests NONE   Final   Culture  Setup Time     Final   Value: 02/09/2013 08:59     Performed at [facility] Count  Final   Value: 40,000 COLONIES/ML     Performed at [facility]   Culture     Final   Value: Multiple bacterial morphotypes present, none predominant. Suggest appropriate recollection if clinically indicated.     Performed at [facility]   Report Status 02/10/2013 FINAL   Final   Studies/Results: Dg Chest 2 View  02/09/2013   CLINICAL DATA:  Cough, confusion  EXAM: CHEST  2 VIEW  COMPARISON:  09/06/2012  FINDINGS: Cardiomediastinal silhouette is stable. Dual lead cardiac pacemaker in place. Central mild bronchitic changes. Small left pleural effusion with left basilar atelectasis or infiltrate. Osteopenia and mild degenerative changes thoracic spine. Atherosclerotic  calcifications of thoracic aorta.  IMPRESSION: Central mild bronchitic changes. Small left pleural effusion with left basilar atelectasis or infiltrate.   Electronically Signed   By: Natasha Mead M.D.   On: 02/09/2013 09:37   Medications: I have reviewed the patient's current medications. Scheduled Meds: . clopidogrel  75 mg Oral QAC breakfast  . famotidine  20 mg Oral BID  . heparin  5,000 Units Subcutaneous Q8H  . latanoprost  1 drop Left Eye QHS  . levofloxacin  500 mg Oral Daily  . methenamine  1 g Oral QID  . metoprolol  25 mg Oral BID  . multivitamin with minerals  1 tablet Oral Daily  . niacin  500 mg Oral BID  . predniSONE  5 mg Oral Q breakfast  . raloxifene  60 mg Oral Daily  . simvastatin  10 mg Oral q1800  . sodium chloride  3 mL Intravenous Q12H  . timolol  1 drop Both Eyes Daily   Continuous Infusions:  PRN Meds:.acetaminophen, acetaminophen, guaiFENesin-dextromethorphan, nitroGLYCERIN, ondansetron (ZOFRAN) IV, ondansetron, polyvinyl alcohol, URIBEL  Assessment/Plan: # Hypoglycemia in the setting Type 2 DM: Resolved, CBGs 103-140. Mental status back to baseline. Likely 2/2 decreased need for oral hypoglycemic medications as it appears as though pt was taking these medications as prescribed. HbA1c still pending, so once this value is obtained I will discharge patient on an adjusted diabetic regimen. I will definitely discontinue amaryl, though depending on the A1c, I may keep metformin on board.  -continue holding metformin and amaryl -discharge on altered oral hypoglycemic regimen w/ f/u HbA1c in 3 months -d/c IVF as patient eating well -CBG check WC and HS -PT/OT eval  # UTI: Patient still having some dysuria. Continue antibiotic therapy. -continue uribel, hiprex- holding trimethoprim -levaquin 500mg  q24h for total 7 days (Day 2/7 today)  # Nonproductive cough, with suspected CAP: Patient still w/ nonproductive cough. Lung sounds may have some rhonchi, though hard to  decipher between that and perhaps transmitted upper airway sounds. CXR c/w possible L basilar PNA. Afebrile overnight, VSS.  -robitussin DM prn  -abx as above   #Suspected dementia: Son is concerned about patient's gradually declining cognitive ability over the past year. Unclear etiology- alzheimer's vs vascular dementia vs pseudodementia vs vitamin B12 deficiency vs thyroid abnormality. -check B12 and TSH level -close f/u with PCP to discuss further -may benefit from f/u with neurology as an outpatient  # CAD s/p CABG in 2007: Stable. ACS ruled out with three negative troponins. EKG relatively unchanged from prior. -contine plavix   # HTN: Stable.  -continue lopressor 25mg  BID   #HLD: Stable. Last LDL 95 in 2012.  -Continue niacin and statin   #RA: Stable, asymptomatic. Continue home prednisone.   # VTE: heparin   Code status: Full  Dispo: Discharge likely today.  The patient does have a current PCP Andrey Campanile Elizebeth Koller, MD) and does not need an [facility] hospital follow-up appointment after discharge.  The patient does not have transportation limitations that hinder transportation to clinic appointments.  .Services Needed at time of discharge: Y = Yes, Blank = No PT:   OT:   RN:   Equipment:   Other:     LOS: 1 day   Windell Hummingbird, MD 02/10/2013, 2:05 PM

## 2013-02-10 NOTE — Discharge Summary (Signed)
Name: [name] MRN: [id number] DOB: Dec 28, 1924 80 y.o. PCP: Kaleen Mask, MD  Date of Admission: 02/09/2013  2:02 AM Date of Discharge: 02/10/2013 Attending Physician: Rocco Serene, MD  Discharge Diagnosis: Principal Problem:   Hypoglycemia- resolved, likely 2/2 oral hypoglycemic medications Active Problems:   Pacemaker   CAD (coronary artery disease)   HTN (hypertension), benign   Hypertrophic cardiomyopathy   DM type 2 (diabetes mellitus, type 2)   UTI (lower urinary tract infection)- treated with levofloxacin 500mg  daily x 7 days   Dementia- needs to be addressed by PCP   Acute confusion   CAP (community acquired pneumonia)- treated with levofloxacin 500mg  daily x 7 days  Discharge Medications:   Medication List    ASK your doctor about these medications       ACIDOPHILUS PO  Take 460 mg by mouth daily.     CALCIUM 500 PO  Take 1 tablet by mouth daily.     chloridazePOXIDE-amitriptyline 5-12.5 MG per tablet  Commonly known as:  LIMBITROL  Take 1 tablet by mouth at bedtime.     clopidogrel 75 MG tablet  Commonly known as:  PLAVIX  Take 1 tablet (75 mg total) by mouth daily before breakfast.     estradiol 2 MG vaginal ring  Commonly known as:  ESTRING  Place 2 mg vaginally every 3 (three) months. follow package directions     glimepiride 4 MG tablet  Commonly known as:  AMARYL  Take 2 mg by mouth daily before breakfast.     HYDROcodone-acetaminophen 5-500 MG per tablet  Commonly known as:  VICODIN  Take 1 tablet by mouth every 6 (six) hours as needed. For pain.     ICAPS PO  Take 1 capsule by mouth daily.     latanoprost 0.005 % ophthalmic solution  Commonly known as:  XALATAN  Place 1 drop into the left eye at bedtime.     lovastatin 20 MG tablet  Commonly known as:  MEVACOR  Take 20 mg by mouth every evening.     metFORMIN 1000 MG tablet  Commonly known as:  GLUCOPHAGE  Take 1,000 mg by mouth 2 (two) times daily.     methenamine 1 G tablet  Commonly known as:  HIPREX  Take 1 g by mouth QID.     metoprolol 50 MG tablet  Commonly known as:  LOPRESSOR  Take 0.5 tablets (25 mg total) by mouth 2 (two) times daily.     multivitamin with minerals Tabs tablet  Take 1 tablet by mouth daily.     niacin 500 MG tablet  Take 500 mg by mouth 2 (two) times daily.     nitroGLYCERIN 0.4 MG SL tablet  Commonly known as:  NITROSTAT  Place 0.4 mg under the tongue every 5 (five) minutes as needed. For chest pain.     predniSONE 5 MG tablet  Commonly known as:  DELTASONE  Take 5 mg by mouth daily with breakfast.     raloxifene 60 MG tablet  Commonly known as:  EVISTA  Take 60 mg by mouth daily.     ranitidine 300 MG tablet  Commonly known as:  ZANTAC  Take 150 mg by mouth 2 (two) times daily.     SYSTANE OP  Apply 1-2 drops to eye as needed. For eye irritation     timolol 0.5 % ophthalmic solution  Commonly known as:  TIMOPTIC  Place 1 drop into both eyes daily.  trimethoprim 100 MG tablet  Commonly known as:  TRIMPEX  Take 100 mg by mouth at bedtime.     URIBEL 118 MG Caps  Take 1 capsule by mouth 4 (four) times daily as needed (usually takes 3 times daily for urinary urgency).     Vitamin D (Ergocalciferol) 50000 UNITS Caps capsule  Commonly known as:  DRISDOL  Take 50,000 Units by mouth every 14 (fourteen) days.       Disposition and follow-up:   [name] was discharged from [facility] in Stable condition.  At the hospital follow up visit please address:  1. Declining cognitive function- over the last year pt has had declining memory per son. Please follow up her B12 level and TSH level as these were pending at discharge. Son wants to talk about options for medications for patient's memory. 2. Diabetes type 2, with episode of hypoglycemia- did patient follow our instructions to discontinue her home metformin and amaryl? What are CBGs running at home? Any more  hypoglycemic episodes? 3. CAP and UTI- did patient complete total of 7 day course of levaquin 500mg  daily? Assess if dysuria resolved and if cough resolved.  2.  Labs / imaging needed at time of follow-up: CXR in 6 weeks to assess resolution of PNA, A1c in 3 months   3.  Pending labs/ test needing follow-up: vitamin B12 level, TSH, urine culture  Follow-up Appointments: Follow-up Information   Follow up with Kaleen Mask, MD. Call in 2 weeks.   Specialty:  Family Medicine   Contact information:   [address] [place] [address] [phone]       Discharge Instructions:   Consultations:  none  Procedures Performed:  Dg Chest 2 View  02/09/2013   CLINICAL DATA:  Cough, confusion  EXAM: CHEST  2 VIEW  COMPARISON:  09/06/2012  FINDINGS: Cardiomediastinal silhouette is stable. Dual lead cardiac pacemaker in place. Central mild bronchitic changes. Small left pleural effusion with left basilar atelectasis or infiltrate. Osteopenia and mild degenerative changes thoracic spine. Atherosclerotic calcifications of thoracic aorta.  IMPRESSION: Central mild bronchitic changes. Small left pleural effusion with left basilar atelectasis or infiltrate.   Electronically Signed   By: Natasha Mead M.D.   On: 02/09/2013 09:37   Admission HPI:  This is a 80yo WF w/ PMH of DM type 2 (last A1c 6.8 05/2011), HTN, HOCM s/p septal myectomy, RA on chronic steroids, GERD, nephrolithiasis s/p L J stent, R nephrectomy, CAD s/p CABG 2007, complete HB w/ pacemaker who presents with hypoglycemia.  Patient was sleeping last night and per her son (who sleeps in her room with her) she was flailing around and making unusual noises around 1am. Son tried to wake pt up, but she was unarousable. He called EMS and upon their arrival her CBG 44. Pt received an amp of D50 and CBG rose to 145. At this point her mental status improved, though patient did have an episode of bowel incontinence that son said was  diarrhea (pt usually continent). Son denied any shaking during the episode, no tongue biting. Patient was sweaty and hot when son woke her up.  Pt was taken to ED where CBG 122, then dropped again to 51. Another amp of D50 given and CBGs recovered to 150s. Pt had BMP and CBC, which were unremarkable. UA revealed green urine (SE of methenamine), 30 protein, trace Hgb, WBC too numerous to count. Pt given ceftriaxone 1mg  for UTI.  Pt does c/o dysuria  x 2-3 days. Son had cold symptoms a few days ago, which he thinks he gave to his mother, but at this point she only has residual nonproductive cough x 2-3 days. Pt and son deny changes in appetite, CP, SOB, leg swelling, N/V. Pt lives w/ son and son reports that patient has been increasingly confused over the past couple of years. She sundowns each night, so son sleeps in same room on a cot. Son is retired and able to help patient at home. At baseline, pt is able to ambulate without assistance, though son reports that over the last year pt becoming more unsteady, though no falls. She does get mildly winded while walking, though this is not new.  Pt was admitted for similar episode of hypoglycemia in March 2013. She takes metformin 1000mg  BID and amaryl 2mg  qAM. She takes these medications as prescribed, last took all medications yesterday. Pt has had recurrent UTI, last UTI per son was 3 months ago. She is on trimethoprim and methenamine for ppx of UTI. Cipro has worked well to treat UTIs in the past.  Hospital Course by problem list:  # Hypoglycemia in the setting Type 2 DM: CBGs recovered fairly quickly on D5NS infusion. Patient's home metformin and amaryl were held. CBGs over night ranged 103-140 (monitored q2h). Mental status back to baseline upon admission and patient remained stable overnight. Patient eating well. A1c 5.8% this admission, therefore her need for oral hypoglycemics has likely decreased. We discontinued amaryl and metformin on admission and did  not restart these on discharge. Patient will need to follow up with PCP and have A1c rechecked in 3 months. PT evaluated patient and recommended and recommended HH PT as well as a wheelchair. Patient was discharged on a holiday, so the [facility] PT and wheelchair were not set up on day of discharge. I spoke with case manager on day after discharge and she had spoken with patient's son- Son did not want HH PT set up, instead he was given outpatient [facility] resources that he will set up and pay for out of pocket. Son also did not want a wheelchair sent to him, he wants to go buy one himself.   # UTI: Patient with dysuria and dirty UA on admission (of note urine was green 2/2 medication side effect). UCx still pending. Patient being treated for both CAP and UTI, so she was given levaquin 500mg  q24h for total of 7 days (had 5 more days to go after her discharge). Held trimethoprim on admission and at discharge. Continued uribel, hiprex.   # Nonproductive cough, with suspected CAP: Patient w/ nonproductive cough and CXR w/ possible L basilar PNA. She remained afebrile with stable vital signs during her stay. Exam was not impressive, though with possible scattered rhonchi (?upper airway sounds). Pt given robitussin DM prn and antibiotics as above.     #Suspected dementia: Son is concerned about patient's gradually declining cognitive ability over the past year. Unclear etiology- alzheimer's vs vascular dementia vs pseudodementia vs vitamin B12 deficiency vs thyroid abnormality. Checked B12 and TSH level, both still pending upon discharge. Patient will need close f/u with PCP to discuss this issue further and she may benefit from f/u with neurology as an outpatient for further work up.  # CAD s/p CABG in 2007: Stable. ACS ruled out with three negative troponins. EKG relatively unchanged from prior. Contined plavix .  # HTN: Stable. Continued lopressor 25mg  BID.  #HLD: Stable. Last LDL 95 in 2012. Continued niacin and  statin.  #RA: Stable, asymptomatic. Continued home prednisone.   Discharge Vitals:   BP 141/78  Pulse 78  Temp(Src) 97.9 F (36.6 C) (Oral)  Resp 18  Ht 5\' 6"  (1.676 m)  Wt 59.36 kg (130 lb 13.8 oz)  BMI 21.13 kg/m2  SpO2 95%  Discharge Labs:  Results for orders placed during the hospital encounter of 02/09/13 (from the past 24 hour(s))  TROPONIN I     Status: None   Collection Time    02/09/13  4:25 PM      Result Value Range   Troponin I <0.30  <0.30 ng/mL  GLUCOSE, CAPILLARY     Status: Abnormal   Collection Time    02/09/13  4:41 PM      Result Value Range   Glucose-Capillary 128 (*) 70 - 99 mg/dL   Comment 1 Notify RN    GLUCOSE, CAPILLARY     Status: Abnormal   Collection Time    02/09/13  7:47 PM      Result Value Range   Glucose-Capillary 113 (*) 70 - 99 mg/dL  GLUCOSE, CAPILLARY     Status: None   Collection Time    02/09/13  9:45 PM      Result Value Range   Glucose-Capillary 82  70 - 99 mg/dL  TROPONIN I     Status: None   Collection Time    02/09/13 10:00 PM      Result Value Range   Troponin I <0.30  <0.30 ng/mL  STREP PNEUMONIAE URINARY ANTIGEN     Status: None   Collection Time    02/09/13 10:50 PM      Result Value Range   Strep Pneumo Urinary Antigen NEGATIVE  NEGATIVE  GLUCOSE, CAPILLARY     Status: Abnormal   Collection Time    02/10/13 12:13 AM      Result Value Range   Glucose-Capillary 128 (*) 70 - 99 mg/dL  GLUCOSE, CAPILLARY     Status: Abnormal   Collection Time    02/10/13  2:09 AM      Result Value Range   Glucose-Capillary 106 (*) 70 - 99 mg/dL  GLUCOSE, CAPILLARY     Status: Abnormal   Collection Time    02/10/13  4:04 AM      Result Value Range   Glucose-Capillary 100 (*) 70 - 99 mg/dL  GLUCOSE, CAPILLARY     Status: None   Collection Time    02/10/13  6:06 AM      Result Value Range   Glucose-Capillary 92  70 - 99 mg/dL  COMPREHENSIVE METABOLIC PANEL     Status: Abnormal   Collection Time    02/10/13  6:17 AM       Result Value Range   Sodium 135 (*) 137 - 147 mEq/L   Potassium 3.9  3.7 - 5.3 mEq/L   Chloride 102  96 - 112 mEq/L   CO2 18 (*) 19 - 32 mEq/L   Glucose, Bld 105 (*) 70 - 99 mg/dL   BUN 11  6 - 23 mg/dL   Creatinine, Ser 1.610.92  0.50 - 1.10 mg/dL   Calcium 7.6 (*) 8.4 - 10.5 mg/dL   Total Protein 5.3 (*) 6.0 - 8.3 g/dL   Albumin 2.9 (*) 3.5 - 5.2 g/dL   AST 30  0 - 37 U/L   ALT 14  0 - 35 U/L   Alkaline Phosphatase 37 (*) 39 - 117 U/L   Total Bilirubin 0.3  0.3 - 1.2 mg/dL   GFR calc non Af Amer 55 (*) >90 mL/min   GFR calc Af Amer 64 (*) >90 mL/min  GLUCOSE, CAPILLARY     Status: Abnormal   Collection Time    02/10/13  7:45 AM      Result Value Range   Glucose-Capillary 103 (*) 70 - 99 mg/dL  GLUCOSE, CAPILLARY     Status: Abnormal   Collection Time    02/10/13 10:00 AM      Result Value Range   Glucose-Capillary 134 (*) 70 - 99 mg/dL  GLUCOSE, CAPILLARY     Status: Abnormal   Collection Time    02/10/13 11:31 AM      Result Value Range   Glucose-Capillary 140 (*) 70 - 99 mg/dL    Signed: Windell Hummingbird, MD 02/10/2013, 2:53 PM   Time Spent on Discharge: 35 minutes Services Ordered on Discharge: outpatient home health resources given, son did not want HHPT set up Equipment Ordered on Discharge: wheelchair was ordered, though son wanted to purchase his own

## 2013-02-10 NOTE — Progress Notes (Signed)
Pt alert to self, c/o HA PRN tylenol given, pt oob with assist x 1, son at bedside, vss, pt stable

## 2013-02-11 LAB — LEGIONELLA ANTIGEN, URINE: Legionella Antigen, Urine: NEGATIVE

## 2013-02-11 NOTE — Progress Notes (Signed)
Discharged instruction given to son with understanding, no questions asked. Take off telemetry and informed monitor tech. IV removed without bleeding. Assisted by the nurse tech per wheelchair.

## 2013-02-11 NOTE — Care Management Note (Signed)
    Page 1 of 1   02/11/2013     11:18:40 AM   CARE MANAGEMENT NOTE 02/11/2013  Patient:  [name], [name]   Account Number:  [id number]  Date Initiated:  02/11/2013  Documentation initiated by:  [facility]  Subjective/Objective Assessment:   80 year old woman with a history of diabetes, hypertension, coronary artery disease, and rheumatoid arthritis on chronic low-dose prednisone who presents with hypoglycemia. //Home with son     Action/Plan:   Amp of D50 //home with HH   Anticipated DC Date:  02/10/2013   Anticipated DC Plan:  HOME W HOME HEALTH SERVICES      DC Planning Services  CM consult      [facility] Choice  NA   Choice offered to / List presented to:  C-4 Adult Children        HH arranged  HH - 11 Patient Refused      Status of service:   Medicare Important Message given?   (If response is "NO", the following Medicare IM given date fields will be blank) Date Medicare IM given:   Date Additional Medicare IM given:    Discharge Disposition:    Per UR Regulation:    If discussed at Long Length of Stay Meetings, dates discussed:    Comments:  02/11/13 1100 Oletta Cohn, RN, BSN, [employer] [phone] Spoke with son, Keeshia Sanderlin regarding discharge planning for [employer]  Jenesa had just laid down for nap and asked not to be disturbed and allowed son to handle call.  I have recommended that this patient have Home Health PT but she and son decline at this time. I have discussed the risks and benefits of HHPT with Rosanne Ashing. He verbalizes understanding stating that his mom gets around fine.  Jim requested number of [facility] as he feels the services they provide is more what his mom needs.  NCM provided numbers as requested.

## 2013-03-05 ENCOUNTER — Encounter (HOSPITAL_COMMUNITY): Payer: Self-pay | Admitting: Emergency Medicine

## 2013-03-05 ENCOUNTER — Emergency Department (HOSPITAL_COMMUNITY): Payer: Medicare Other

## 2013-03-05 ENCOUNTER — Inpatient Hospital Stay (HOSPITAL_COMMUNITY)
Admission: EM | Admit: 2013-03-05 | Discharge: 2013-03-07 | DRG: 689 | Disposition: A | Discharge status: Hospice | Payer: Medicare Other | Attending: Internal Medicine | Admitting: Internal Medicine

## 2013-03-05 DIAGNOSIS — Z66 Do not resuscitate: Secondary | ICD-10-CM | POA: Diagnosis present

## 2013-03-05 DIAGNOSIS — E119 Type 2 diabetes mellitus without complications: Secondary | ICD-10-CM | POA: Diagnosis present

## 2013-03-05 DIAGNOSIS — Z801 Family history of malignant neoplasm of trachea, bronchus and lung: Secondary | ICD-10-CM

## 2013-03-05 DIAGNOSIS — IMO0002 Reserved for concepts with insufficient information to code with codable children: Secondary | ICD-10-CM

## 2013-03-05 DIAGNOSIS — K219 Gastro-esophageal reflux disease without esophagitis: Secondary | ICD-10-CM | POA: Diagnosis present

## 2013-03-05 DIAGNOSIS — E785 Hyperlipidemia, unspecified: Secondary | ICD-10-CM | POA: Diagnosis present

## 2013-03-05 DIAGNOSIS — Z87440 Personal history of urinary (tract) infections: Secondary | ICD-10-CM

## 2013-03-05 DIAGNOSIS — Z8249 Family history of ischemic heart disease and other diseases of the circulatory system: Secondary | ICD-10-CM

## 2013-03-05 DIAGNOSIS — K589 Irritable bowel syndrome without diarrhea: Secondary | ICD-10-CM | POA: Diagnosis present

## 2013-03-05 DIAGNOSIS — I422 Other hypertrophic cardiomyopathy: Secondary | ICD-10-CM | POA: Diagnosis present

## 2013-03-05 DIAGNOSIS — M069 Rheumatoid arthritis, unspecified: Secondary | ICD-10-CM | POA: Diagnosis present

## 2013-03-05 DIAGNOSIS — M810 Age-related osteoporosis without current pathological fracture: Secondary | ICD-10-CM | POA: Diagnosis present

## 2013-03-05 DIAGNOSIS — Z87442 Personal history of urinary calculi: Secondary | ICD-10-CM

## 2013-03-05 DIAGNOSIS — F0150 Vascular dementia without behavioral disturbance: Secondary | ICD-10-CM | POA: Diagnosis present

## 2013-03-05 DIAGNOSIS — Z87891 Personal history of nicotine dependence: Secondary | ICD-10-CM

## 2013-03-05 DIAGNOSIS — N390 Urinary tract infection, site not specified: Principal | ICD-10-CM | POA: Diagnosis present

## 2013-03-05 DIAGNOSIS — Z7902 Long term (current) use of antithrombotics/antiplatelets: Secondary | ICD-10-CM

## 2013-03-05 DIAGNOSIS — R32 Unspecified urinary incontinence: Secondary | ICD-10-CM | POA: Diagnosis present

## 2013-03-05 DIAGNOSIS — I672 Cerebral atherosclerosis: Secondary | ICD-10-CM | POA: Diagnosis present

## 2013-03-05 DIAGNOSIS — Z951 Presence of aortocoronary bypass graft: Secondary | ICD-10-CM

## 2013-03-05 DIAGNOSIS — G9340 Encephalopathy, unspecified: Secondary | ICD-10-CM | POA: Diagnosis present

## 2013-03-05 DIAGNOSIS — I2510 Atherosclerotic heart disease of native coronary artery without angina pectoris: Secondary | ICD-10-CM | POA: Diagnosis present

## 2013-03-05 DIAGNOSIS — Z905 Acquired absence of kidney: Secondary | ICD-10-CM

## 2013-03-05 DIAGNOSIS — Z950 Presence of cardiac pacemaker: Secondary | ICD-10-CM

## 2013-03-05 DIAGNOSIS — Z515 Encounter for palliative care: Secondary | ICD-10-CM

## 2013-03-05 DIAGNOSIS — I10 Essential (primary) hypertension: Secondary | ICD-10-CM | POA: Diagnosis present

## 2013-03-05 DIAGNOSIS — F0390 Unspecified dementia without behavioral disturbance: Secondary | ICD-10-CM

## 2013-03-05 LAB — COMPREHENSIVE METABOLIC PANEL
ALK PHOS: 53 U/L (ref 39–117)
ALT: 13 U/L (ref 0–35)
AST: 21 U/L (ref 0–37)
Albumin: 3.4 g/dL — ABNORMAL LOW (ref 3.5–5.2)
BUN: 13 mg/dL (ref 6–23)
CHLORIDE: 97 meq/L (ref 96–112)
CO2: 22 meq/L (ref 19–32)
Calcium: 8.7 mg/dL (ref 8.4–10.5)
Creatinine, Ser: 0.98 mg/dL (ref 0.50–1.10)
GFR calc Af Amer: 59 mL/min — ABNORMAL LOW (ref >90)
GFR, EST NON AFRICAN AMERICAN: 51 mL/min — AB (ref >90)
GLUCOSE: 191 mg/dL — AB (ref 70–99)
POTASSIUM: 4.4 meq/L (ref 3.7–5.3)
SODIUM: 133 meq/L — AB (ref 137–147)
Total Bilirubin: 0.7 mg/dL (ref 0.3–1.2)
Total Protein: 6 g/dL (ref 6.0–8.3)

## 2013-03-05 LAB — CBC WITH DIFFERENTIAL/PLATELET
Basophils Absolute: 0 10*3/uL (ref 0.0–0.1)
Basophils Relative: 0 % (ref 0–1)
EOS ABS: 0 10*3/uL (ref 0.0–0.7)
Eosinophils Relative: 1 % (ref 0–5)
HCT: 34.6 % — ABNORMAL LOW (ref 36.0–46.0)
HEMOGLOBIN: 11.9 g/dL — AB (ref 12.0–15.0)
LYMPHS ABS: 1.2 10*3/uL (ref 0.7–4.0)
Lymphocytes Relative: 16 % (ref 12–46)
MCH: 29.7 pg (ref 26.0–34.0)
MCHC: 34.4 g/dL (ref 30.0–36.0)
MCV: 86.3 fL (ref 78.0–100.0)
Monocytes Absolute: 1.1 10*3/uL — ABNORMAL HIGH (ref 0.1–1.0)
Monocytes Relative: 14 % — ABNORMAL HIGH (ref 3–12)
NEUTROS ABS: 5.3 10*3/uL (ref 1.7–7.7)
Neutrophils Relative %: 69 % (ref 43–77)
PLATELETS: 195 10*3/uL (ref 150–400)
RBC: 4.01 MIL/uL (ref 3.87–5.11)
RDW: 13.6 % (ref 11.5–15.5)
WBC: 7.6 10*3/uL (ref 4.0–10.5)

## 2013-03-05 LAB — URINALYSIS, ROUTINE W REFLEX MICROSCOPIC
BILIRUBIN URINE: NEGATIVE
Glucose, UA: NEGATIVE mg/dL
Ketones, ur: NEGATIVE mg/dL
Nitrite: NEGATIVE
Protein, ur: 100 mg/dL — AB
SPECIFIC GRAVITY, URINE: 1.012 (ref 1.005–1.030)
UROBILINOGEN UA: 0.2 mg/dL (ref 0.0–1.0)
pH: 6 (ref 5.0–8.0)

## 2013-03-05 LAB — TROPONIN I: Troponin I: <0.3 ng/mL (ref <0.30)

## 2013-03-05 LAB — CG4 I-STAT (LACTIC ACID): LACTIC ACID, VENOUS: 1.55 mmol/L (ref 0.5–2.2)

## 2013-03-05 LAB — AMMONIA: Ammonia: 23 umol/L (ref 11–60)

## 2013-03-05 LAB — URINE MICROSCOPIC-ADD ON

## 2013-03-05 LAB — GLUCOSE, CAPILLARY
GLUCOSE-CAPILLARY: 154 mg/dL — AB (ref 70–99)
GLUCOSE-CAPILLARY: 156 mg/dL — AB (ref 70–99)
Glucose-Capillary: 145 mg/dL — ABNORMAL HIGH (ref 70–99)
Glucose-Capillary: 73 mg/dL (ref 70–99)

## 2013-03-05 MED ORDER — ADULT MULTIVITAMIN W/MINERALS CH
1.0000 | ORAL_TABLET | Freq: Every day | ORAL | Status: DC
  Administered 2013-03-05 – 2013-03-07 (×3): 1 via ORAL
  Filled 2013-03-05 (×3): qty 1

## 2013-03-05 MED ORDER — LATANOPROST 0.005 % OP SOLN
1.0000 [drp] | Freq: Every day | OPHTHALMIC | Status: DC
  Administered 2013-03-05 – 2013-03-06 (×2): 1 [drp] via OPHTHALMIC
  Filled 2013-03-05 (×2): qty 2.5

## 2013-03-05 MED ORDER — INSULIN ASPART 100 UNIT/ML ~~LOC~~ SOLN: 0.0000 [IU] | Freq: Every day | SUBCUTANEOUS | Status: DC

## 2013-03-05 MED ORDER — SODIUM CHLORIDE 0.9 % IJ SOLN: 3.0000 mL | INTRAMUSCULAR | Status: DC | PRN

## 2013-03-05 MED ORDER — ACETAMINOPHEN 650 MG RE SUPP: 650.0000 mg | Freq: Four times a day (QID) | RECTAL | Status: DC | PRN

## 2013-03-05 MED ORDER — RALOXIFENE HCL 60 MG PO TABS
60.0000 mg | ORAL_TABLET | Freq: Every day | ORAL | Status: DC
  Administered 2013-03-05 – 2013-03-07 (×3): 60 mg via ORAL
  Filled 2013-03-05 (×3): qty 1

## 2013-03-05 MED ORDER — METOPROLOL TARTRATE 25 MG PO TABS
25.0000 mg | ORAL_TABLET | Freq: Two times a day (BID) | ORAL | Status: DC
  Administered 2013-03-05 – 2013-03-07 (×5): 25 mg via ORAL
  Filled 2013-03-05 (×7): qty 1

## 2013-03-05 MED ORDER — SODIUM CHLORIDE 0.9 % IV SOLN
INTRAVENOUS | Status: AC
  Administered 2013-03-05: 11:00:00 via INTRAVENOUS

## 2013-03-05 MED ORDER — HYDRALAZINE HCL 20 MG/ML IJ SOLN
10.0000 mg | Freq: Four times a day (QID) | INTRAMUSCULAR | Status: DC | PRN
  Administered 2013-03-05: 10 mg via INTRAVENOUS
  Filled 2013-03-05: qty 1

## 2013-03-05 MED ORDER — ONDANSETRON HCL 4 MG/2ML IJ SOLN: 4.0000 mg | Freq: Four times a day (QID) | INTRAMUSCULAR | Status: DC | PRN

## 2013-03-05 MED ORDER — SODIUM CHLORIDE 0.9 % IV SOLN: Freq: Once | INTRAVENOUS | Status: DC

## 2013-03-05 MED ORDER — INSULIN ASPART 100 UNIT/ML ~~LOC~~ SOLN
0.0000 [IU] | Freq: Three times a day (TID) | SUBCUTANEOUS | Status: DC
  Administered 2013-03-05: 3 [IU] via SUBCUTANEOUS
  Administered 2013-03-06: 2 [IU] via SUBCUTANEOUS
  Administered 2013-03-06: 5 [IU] via SUBCUTANEOUS
  Administered 2013-03-06: 2 [IU] via SUBCUTANEOUS
  Administered 2013-03-07: 5 [IU] via SUBCUTANEOUS
  Administered 2013-03-07: 3 [IU] via SUBCUTANEOUS

## 2013-03-05 MED ORDER — FAMOTIDINE 20 MG PO TABS
20.0000 mg | ORAL_TABLET | Freq: Every day | ORAL | Status: DC
  Administered 2013-03-05 – 2013-03-07 (×3): 20 mg via ORAL
  Filled 2013-03-05 (×3): qty 1

## 2013-03-05 MED ORDER — TIMOLOL MALEATE 0.5 % OP SOLN
1.0000 [drp] | Freq: Every day | OPHTHALMIC | Status: DC
  Administered 2013-03-05 – 2013-03-07 (×3): 1 [drp] via OPHTHALMIC
  Filled 2013-03-05: qty 5

## 2013-03-05 MED ORDER — SODIUM CHLORIDE 0.9 % IV SOLN: 250.0000 mL | INTRAVENOUS | Status: DC | PRN

## 2013-03-05 MED ORDER — CLOPIDOGREL BISULFATE 75 MG PO TABS
75.0000 mg | ORAL_TABLET | Freq: Every day | ORAL | Status: DC
  Administered 2013-03-05 – 2013-03-07 (×3): 75 mg via ORAL
  Filled 2013-03-05 (×4): qty 1

## 2013-03-05 MED ORDER — TRIMETHOPRIM 100 MG PO TABS
100.0000 mg | ORAL_TABLET | Freq: Two times a day (BID) | ORAL | Status: DC
  Administered 2013-03-05 – 2013-03-06 (×3): 100 mg via ORAL
  Filled 2013-03-05 (×6): qty 1

## 2013-03-05 MED ORDER — SODIUM CHLORIDE 0.9 % IJ SOLN
3.0000 mL | Freq: Two times a day (BID) | INTRAMUSCULAR | Status: DC
  Administered 2013-03-06 – 2013-03-07 (×4): 3 mL via INTRAVENOUS

## 2013-03-05 MED ORDER — NIACIN 500 MG PO TABS
500.0000 mg | ORAL_TABLET | Freq: Two times a day (BID) | ORAL | Status: DC
  Administered 2013-03-05 – 2013-03-07 (×4): 500 mg via ORAL
  Filled 2013-03-05 (×6): qty 1

## 2013-03-05 MED ORDER — DEXTROSE 5 % IV SOLN
1.0000 g | INTRAVENOUS | Status: DC
  Administered 2013-03-06: 1 g via INTRAVENOUS
  Filled 2013-03-05 (×2): qty 10

## 2013-03-05 MED ORDER — PREDNISONE 5 MG PO TABS
5.0000 mg | ORAL_TABLET | Freq: Every day | ORAL | Status: DC
  Administered 2013-03-06 – 2013-03-07 (×2): 5 mg via ORAL
  Filled 2013-03-05 (×3): qty 1

## 2013-03-05 MED ORDER — ENOXAPARIN SODIUM 40 MG/0.4ML ~~LOC~~ SOLN
40.0000 mg | SUBCUTANEOUS | Status: DC
  Administered 2013-03-05 – 2013-03-06 (×2): 40 mg via SUBCUTANEOUS
  Filled 2013-03-05 (×2): qty 0.4

## 2013-03-05 MED ORDER — SIMVASTATIN 10 MG PO TABS
10.0000 mg | ORAL_TABLET | Freq: Every day | ORAL | Status: DC
  Administered 2013-03-05 – 2013-03-06 (×2): 10 mg via ORAL
  Filled 2013-03-05 (×3): qty 1

## 2013-03-05 MED ORDER — ONDANSETRON HCL 4 MG PO TABS: 4.0000 mg | ORAL_TABLET | Freq: Four times a day (QID) | ORAL | Status: DC | PRN

## 2013-03-05 MED ORDER — ACETAMINOPHEN 325 MG PO TABS
650.0000 mg | ORAL_TABLET | Freq: Four times a day (QID) | ORAL | Status: DC | PRN
  Administered 2013-03-05 – 2013-03-06 (×2): 650 mg via ORAL
  Filled 2013-03-05 (×2): qty 2

## 2013-03-05 MED ORDER — DEXTROSE 5 % IV SOLN
1.0000 g | Freq: Once | INTRAVENOUS | Status: AC
  Administered 2013-03-05: 1 g via INTRAVENOUS
  Filled 2013-03-05: qty 10

## 2013-03-05 NOTE — ED Notes (Signed)
Labs results given to EDP.

## 2013-03-05 NOTE — ED Notes (Signed)
Admitting at bedside 

## 2013-03-05 NOTE — ED Provider Notes (Signed)
CSN: [id number]     Arrival date & time 03/05/13  7681 History   First MD Initiated Contact with Patient 03/05/13 [phone]     Chief Complaint  Patient presents with  . Altered Mental Status   (Consider location/radiation/quality/duration/timing/severity/associated sxs/prior Treatment) HPI Comments: Patient from home with apparently increasing confusion over the past several days. She has a history of dementia. She lives at home with her son. EMS reports that he states she's been getting more confused and urinating on herself. Patient is awake and alert and pleasant. She denies any complaints she is oriented x1. She follows commands intermittently. She denies any pain.  The history is provided by the patient and the EMS personnel. The history is limited by the condition of the patient.    Past Medical History  Diagnosis Date  . Pacemaker   . CAD (coronary artery disease)   . CHB (complete heart block)   . HTN (hypertension), benign   . DM (diabetes mellitus)   . Hypertrophic cardiomyopathy   . Hyperlipidemia   . OP (osteoporosis)   . Hx: UTI (urinary tract infection)   . Fatigue   . Rheumatoid arthritis(714.0)   . Chronic steroid use   . Confusion   . Pancreatic pseudocyst   . Diverticulosis   . Shortness of breath   . Bruises easily   . GERD (gastroesophageal reflux disease)   . History of IBS   . Renal calculus     has stent  . H/O unilateral nephrectomy   . Blood transfusion   . Difficulty sleeping     takes nightly med for sleep  . Complication of anesthesia     CONFUSION   Past Surgical History  Procedure Laterality Date  . Cardiac catheterization  04/17/2005    EF 65%  . Septal myectomy    . Nephrectomy      RIGHT  . Arm fracture surgery      RIGHT ARM  . Coronary artery bypass graft  04/2005    LIMA GRAFT TO THE DISTAL LAD, SAPHENOUS VEIN GRAFT TO THE DIAGONAL, SAPHENOUS VEIN GRAFT TO THE INTERMEDIATE BRANCH, AND SAPHENOUS VEIN GRAFT TO THE DISTAL RIGHT CORONARY   . Myomectomy    . Cataract extraction, bilateral    . Cystoscopy  05/07/2011    Procedure: CYSTOSCOPY;  Surgeon: Crecencio Mc, MD;  Location: [facility] OR;  Service: Urology;  Laterality: Left;  . Insert / replace / remove pacemaker  2006  . Cystoscopy/retrograde/ureteroscopy/stone extraction with basket  07/17/2011    Procedure: CYSTOSCOPY/RETROGRADE/URETEROSCOPY/STONE EXTRACTION WITH BASKET;  Surgeon: Kathi Ludwig, MD;  Location: [facility] ORS;  Service: Urology;  Laterality: Left;  Cystoscopy, Left Ureteroscopy, Retrograde Pyelogram, Stone Extraction, Holmium Laser Lithotripsy, Stent Exchange with Polaris Stent    . Cystoscopy w/ ureteral stent placement  07/17/2011    Procedure: CYSTOSCOPY WITH STENT REPLACEMENT;  Surgeon: Kathi Ludwig, MD;  Location: [facility] ORS;  Service: Urology;  Laterality: Left;  USE POLARIS STENT   Family History  Problem Relation Age of Onset  . Lung cancer Mother   . Cancer Father   . Heart attack Brother    History  Substance Use Topics  . Smoking status: Former Smoker -- 1.00 packs/day    Types: Cigarettes    Quit date: 05/07/1990  . Smokeless tobacco: Never Used  . Alcohol Use: No   OB History   Grav Para Term Preterm Abortions TAB SAB Ect Mult Living  Review of Systems  Unable to perform ROS: Dementia    Allergies  Codeine  Home Medications   No current outpatient prescriptions on file. BP 181/72  Pulse 75  Temp(Src) 97.7 F (36.5 C) (Axillary)  Resp 19  SpO2 95% Physical Exam  Constitutional: She appears well-developed and well-nourished. No distress.  HENT:  Head: Normocephalic and atraumatic.  Mouth/Throat: Oropharynx is clear and moist. No oropharyngeal exudate.  Eyes: Conjunctivae and EOM are normal. Pupils are equal, round, and reactive to light.  Neck: Normal range of motion. Neck supple.  Cardiovascular: Normal rate and regular rhythm.   Murmur heard. holosytolic AS murmur  Pulmonary/Chest: Effort normal and  breath sounds normal. No respiratory distress.  Abdominal: Soft. There is no tenderness. There is no rebound and no guarding.  Musculoskeletal: Normal range of motion. She exhibits no edema and no tenderness.  Neurological: She is alert. No cranial nerve deficit. She exhibits normal muscle tone. Coordination normal.  Oriented x1. Follows commands intermittently. Moving all extremities equally  Skin: Skin is warm.    ED Course  Procedures (including critical care time) Labs Review Labs Reviewed  CBC WITH DIFFERENTIAL - Abnormal; Notable for the following:    Hemoglobin 11.9 (*)    HCT 34.6 (*)    Monocytes Relative 14 (*)    Monocytes Absolute 1.1 (*)    All other components within normal limits  COMPREHENSIVE METABOLIC PANEL - Abnormal; Notable for the following:    Sodium 133 (*)    Glucose, Bld 191 (*)    Albumin 3.4 (*)    GFR calc non Af Amer 51 (*)    GFR calc Af Amer 59 (*)    All other components within normal limits  URINALYSIS, ROUTINE W REFLEX MICROSCOPIC - Abnormal; Notable for the following:    APPearance TURBID (*)    Hgb urine dipstick LARGE (*)    Protein, ur 100 (*)    Leukocytes, UA LARGE (*)    All other components within normal limits  GLUCOSE, CAPILLARY - Abnormal; Notable for the following:    Glucose-Capillary 154 (*)    All other components within normal limits  URINE MICROSCOPIC-ADD ON - Abnormal; Notable for the following:    Squamous Epithelial / LPF FEW (*)    Bacteria, UA MANY (*)    All other components within normal limits  GLUCOSE, CAPILLARY - Abnormal; Notable for the following:    Glucose-Capillary 145 (*)    All other components within normal limits  URINE CULTURE  AMMONIA  TROPONIN I  CG4 I-STAT (LACTIC ACID)   Imaging Review Dg Chest 2 View  03/05/2013   CLINICAL DATA:  Altered mental status  EXAM: CHEST  2 VIEW  COMPARISON:  02/09/2013  FINDINGS: Left-sided pacer in place. Evidence of CABG again noted. Heart size at upper limits of  normal. Patchy left basilar airspace disease is reidentified. Interval resolution of previously seen pleural effusions. Clips are partly visualized over the upper abdomen on the lateral projection with aortic calcification without calcified aneurysm.  IMPRESSION: Stable patchy left lower lobe airspace opacity which could represent atelectasis although slow to resolve pneumonia could appear similar. Followup to radiographic resolution is recommended in 4-6 weeks.  Resolution of previously seen pleural effusion.   Electronically Signed   By: Christiana Pellant M.D.   On: 03/05/2013 10:16   Ct Head Wo Contrast  03/05/2013   CLINICAL DATA:  Confusion, altered mental status for several months  EXAM: CT HEAD WITHOUT CONTRAST  TECHNIQUE: Contiguous axial images were obtained from the base of the skull through the vertex without intravenous contrast.  COMPARISON:  05/18/2011  FINDINGS: Mild cortical volume loss noted with proportional ventricular prominence. Areas of periventricular white matter hypodensity are most compatible with small vessel ischemic change. No acute hemorrhage, infarct, or mass lesion is identified. Interval development of right basal ganglial lacunar infarct. No midline shift. Left external capsule lacunar infarct reidentified. Motion artifact at the skullbase noted. Partial opacification of the left sphenoid sinus reidentified with internal hyperdense material but could indicate inspissated secretions. No skull fracture.  IMPRESSION: No acute intracranial finding. Chronic findings as above with bilateral basal ganglia lacunar infarcts.  Chronic left sphenoid sinusitis.   Electronically Signed   By: Christiana Pellant M.D.   On: 03/05/2013 09:45    EKG Interpretation    Date/Time:  Saturday March 05 2013 08:39:51 EST Ventricular Rate:  75 PR Interval:  183 QRS Duration: 146 QT Interval:  450 QTC Calculation: 503 R Axis:   -72 Text Interpretation:  Sinus rhythm Left bundle branch block No  significant change was found Confirmed by Manus Gunning  MD, Nieve Rojero ([id number]) on 03/05/2013 9:39:20 AM            MDM   1. Urinary tract infection   2. Dementia   3. CAD (coronary artery disease)   4. DM type 2 (diabetes mellitus, type 2)    Dementia with progressively worsening mental status and increasing confusion.  Additional history obtained from patient's son. He states that she was excessively confused this morning and did not recognize them. She had a fall several days ago but that she may have any injuries. He is taking care of her himself at home.  Patient's workup is remarkable for infected looking UA with leukocytosis. CT head is negative. Chest x-ray shows residual infiltrate. Patient has not had any cough or shortness of breath.  Son is concerned about worsening of patient's dementia but feels that he is to take care of her at home. It seems her urinary tract infection is likely make her dementia and confusion worse. She'll be admitted to the hospital for IV antibiotics and further therapies.   Glynn Octave, MD 03/05/13 [phone]

## 2013-03-05 NOTE — ED Notes (Signed)
Patient transported to X-ray / CT scan. 

## 2013-03-05 NOTE — ED Notes (Signed)
RN called floor to ensure pt room will be visible to staff if son leaves per MD care order.

## 2013-03-05 NOTE — ED Notes (Signed)
Son reports worsening confusion & abnormal behavior x 2 months. Pt with no voiced complaints. Denies any pain.  Son requesting a CT scan & neurology consult

## 2013-03-05 NOTE — H&P (Signed)
[facility] History and Physical  VERNER TEWARI GDJ:[id number] DOB: [birth year] DOA: 03/05/2013  Referring physician: er PCP: Kaleen Mask, MD   Chief Complaint: AMS  HPI: [name] is a 80 y.o. female  Who lives with her son at home.  For 1 day, she has been having increasing confusion.  She has dementia and requires 24 hour care by the son and sometimes hired caregivers.  Son plans on keeping her at home as long as he can.  Son reports that her confusion has worsened and she has been urinating on herself more.   She was recently in the hospital, discharge 1/1 with CAP and UTI.  Dementia work up was started at that time- B12 and TSH- B12 normal and TSH mildly low- will recheck along with T3, T4.  Per talking with son, it appears that patient has gradually been worsening for years- now unable to dress/bathe herself.  In the ER, patient was found to have a UTI- hospitalist were asked to admit for UTI.  Son very concerned about dementia and requesting neuro consult.   Review of Systems:  All systems reviewed, negative unless stated above   Past Medical History  Diagnosis Date  . Pacemaker   . CAD (coronary artery disease)   . CHB (complete heart block)   . HTN (hypertension), benign   . DM (diabetes mellitus)   . Hypertrophic cardiomyopathy   . Hyperlipidemia   . OP (osteoporosis)   . Hx: UTI (urinary tract infection)   . Fatigue   . Rheumatoid arthritis(714.0)   . Chronic steroid use   . Confusion   . Pancreatic pseudocyst   . Diverticulosis   . Shortness of breath   . Bruises easily   . GERD (gastroesophageal reflux disease)   . History of IBS   . Renal calculus     has stent  . H/O unilateral nephrectomy   . Blood transfusion   . Difficulty sleeping     takes nightly med for sleep  . Complication of anesthesia     CONFUSION   Past Surgical History  Procedure Laterality Date  . Cardiac catheterization  04/17/2005    EF 65%  . Septal myectomy     . Nephrectomy      RIGHT  . Arm fracture surgery      RIGHT ARM  . Coronary artery bypass graft  04/2005    LIMA GRAFT TO THE DISTAL LAD, SAPHENOUS VEIN GRAFT TO THE DIAGONAL, SAPHENOUS VEIN GRAFT TO THE INTERMEDIATE BRANCH, AND SAPHENOUS VEIN GRAFT TO THE DISTAL RIGHT CORONARY  . Myomectomy    . Cataract extraction, bilateral    . Cystoscopy  05/07/2011    Procedure: CYSTOSCOPY;  Surgeon: Crecencio Mc, MD;  Location: [facility] OR;  Service: Urology;  Laterality: Left;  . Insert / replace / remove pacemaker  2006  . Cystoscopy/retrograde/ureteroscopy/stone extraction with basket  07/17/2011    Procedure: CYSTOSCOPY/RETROGRADE/URETEROSCOPY/STONE EXTRACTION WITH BASKET;  Surgeon: Kathi Ludwig, MD;  Location: [facility] ORS;  Service: Urology;  Laterality: Left;  Cystoscopy, Left Ureteroscopy, Retrograde Pyelogram, Stone Extraction, Holmium Laser Lithotripsy, Stent Exchange with Polaris Stent    . Cystoscopy w/ ureteral stent placement  07/17/2011    Procedure: CYSTOSCOPY WITH STENT REPLACEMENT;  Surgeon: Kathi Ludwig, MD;  Location: [facility] ORS;  Service: Urology;  Laterality: Left;  USE POLARIS STENT   Social History:  reports that she quit smoking about 22 years ago. Her smoking use included Cigarettes. She smoked 1.00 pack per  day. She has never used smokeless tobacco. She reports that she does not drink alcohol or use illicit drugs.  Allergies  Allergen Reactions  . Codeine Other (See Comments)    Blacks out    Family History  Problem Relation Age of Onset  . Lung cancer Mother   . Cancer Father   . Heart attack Brother      Prior to Admission medications   Medication Sig Start Date End Date Taking? Authorizing Provider  Calcium Carbonate (CALCIUM 500 PO) Take 1 tablet by mouth daily.   Yes Historical Provider, MD  clopidogrel (PLAVIX) 75 MG tablet Take 1 tablet (75 mg total) by mouth daily before breakfast. 09/29/12  Yes Peter M [place], MD  estradiol (ESTRING) 2 MG vaginal ring Place 2  mg vaginally every 3 (three) months. follow package directions   Yes Historical Provider, MD  HYDROcodone-acetaminophen (VICODIN) 5-500 MG per tablet Take 1 tablet by mouth every 6 (six) hours as needed. For pain.   Yes Historical Provider, MD  Lactobacillus (ACIDOPHILUS PO) Take 460 mg by mouth daily.   Yes Historical Provider, MD  latanoprost (XALATAN) 0.005 % ophthalmic solution Place 1 drop into the left eye at bedtime.    Yes Historical Provider, MD  lovastatin (MEVACOR) 20 MG tablet Take 20 mg by mouth every evening.   Yes Historical Provider, MD  metFORMIN (GLUCOPHAGE) 500 MG tablet Take 500 mg by mouth 2 (two) times daily with a meal.   Yes Historical Provider, MD  Meth-Hyo-M Bl-Na Phos-Ph Sal (URIBEL) 118 MG CAPS Take 1 capsule by mouth 4 (four) times daily as needed (usually takes 3 times daily for urinary urgency).   Yes Historical Provider, MD  methenamine (HIPREX) 1 G tablet Take 1 g by mouth QID.   Yes Historical Provider, MD  metoprolol (LOPRESSOR) 50 MG tablet Take 0.5 tablets (25 mg total) by mouth 2 (two) times daily. 09/22/12  Yes Marinus Maw, MD  Multiple Vitamin (MULITIVITAMIN WITH MINERALS) TABS Take 1 tablet by mouth daily.   Yes Historical Provider, MD  Multiple Vitamins-Minerals (ICAPS PO) Take 1 capsule by mouth daily.   Yes Historical Provider, MD  niacin 500 MG tablet Take 500 mg by mouth 2 (two) times daily.   Yes Historical Provider, MD  nitroGLYCERIN (NITROSTAT) 0.4 MG SL tablet Place 0.4 mg under the tongue every 5 (five) minutes as needed. For chest pain. 10/02/10  Yes Peter M [place], MD  Polyethyl Glycol-Propyl Glycol (SYSTANE OP) Apply 1-2 drops to eye as needed. For eye irritation   Yes Historical Provider, MD  predniSONE (DELTASONE) 5 MG tablet Take 5 mg by mouth daily with breakfast.    Yes Historical Provider, MD  raloxifene (EVISTA) 60 MG tablet Take 60 mg by mouth daily.     Yes Historical Provider, MD  ranitidine (ZANTAC) 300 MG tablet Take 150 mg by mouth 2  (two) times daily.   Yes Historical Provider, MD  timolol (TIMOPTIC) 0.5 % ophthalmic solution Place 1 drop into both eyes daily.  06/16/11  Yes Historical Provider, MD  trimethoprim (TRIMPEX) 100 MG tablet Take 100 mg by mouth 2 (two) times daily.   Yes Historical Provider, MD  Vitamin D, Ergocalciferol, (DRISDOL) 50000 UNITS CAPS Take 50,000 Units by mouth every 14 (fourteen) days.   Yes Historical Provider, MD   Physical Exam: Filed Vitals:   03/05/13 1130  BP: 146/67  Pulse: 75  Temp:   Resp:     BP 146/67  Pulse 75  Temp(Src) 98.6 F (37 C) (Rectal)  Resp 21  SpO2 97%  General:  Smiles, does not speak Eyes: PERRL, normal lids, irises & conjunctiva ENT: grossly normal hearing, lips & tongue Neck: no LAD, masses or thyromegaly Cardiovascular: RRR, no m/r/g. No LE edema. Respiratory: CTA bilaterally, no w/r/r. Normal respiratory effort. Abdomen: soft, ntnd Musculoskeletal: bruising on LE, moves all 4 ext Psychiatric: not verbal, cooperative Neurologic: not following commands          Labs on Admission:  Basic Metabolic Panel:  Recent Labs Lab 03/05/13 0834  NA 133*  K 4.4  CL 97  CO2 22  GLUCOSE 191*  BUN 13  CREATININE 0.98  CALCIUM 8.7   Liver Function Tests:  Recent Labs Lab 03/05/13 0834  AST 21  ALT 13  ALKPHOS 53  BILITOT 0.7  PROT 6.0  ALBUMIN 3.4*   No results found for this basename: LIPASE, AMYLASE,  in the last 168 hours  Recent Labs Lab 03/05/13 0834  AMMONIA 23   CBC:  Recent Labs Lab 03/05/13 0834  WBC 7.6  NEUTROABS 5.3  HGB 11.9*  HCT 34.6*  MCV 86.3  PLT 195   Cardiac Enzymes: No results found for this basename: CKTOTAL, CKMB, CKMBINDEX, TROPONINI,  in the last 168 hours  BNP (last 3 results) No results found for this basename: PROBNP,  in the last 8760 hours CBG:  Recent Labs Lab 03/05/13 0854  GLUCAP 154*    Radiological Exams on Admission: Dg Chest 2 View  03/05/2013   CLINICAL DATA:  Altered mental  status  EXAM: CHEST  2 VIEW  COMPARISON:  02/09/2013  FINDINGS: Left-sided pacer in place. Evidence of CABG again noted. Heart size at upper limits of normal. Patchy left basilar airspace disease is reidentified. Interval resolution of previously seen pleural effusions. Clips are partly visualized over the upper abdomen on the lateral projection with aortic calcification without calcified aneurysm.  IMPRESSION: Stable patchy left lower lobe airspace opacity which could represent atelectasis although slow to resolve pneumonia could appear similar. Followup to radiographic resolution is recommended in 4-6 weeks.  Resolution of previously seen pleural effusion.   Electronically Signed   By: Christiana Pellant M.D.   On: 03/05/2013 10:16   Ct Head Wo Contrast  03/05/2013   CLINICAL DATA:  Confusion, altered mental status for several months  EXAM: CT HEAD WITHOUT CONTRAST  TECHNIQUE: Contiguous axial images were obtained from the base of the skull through the vertex without intravenous contrast.  COMPARISON:  05/18/2011  FINDINGS: Mild cortical volume loss noted with proportional ventricular prominence. Areas of periventricular white matter hypodensity are most compatible with small vessel ischemic change. No acute hemorrhage, infarct, or mass lesion is identified. Interval development of right basal ganglial lacunar infarct. No midline shift. Left external capsule lacunar infarct reidentified. Motion artifact at the skullbase noted. Partial opacification of the left sphenoid sinus reidentified with internal hyperdense material but could indicate inspissated secretions. No skull fracture.  IMPRESSION: No acute intracranial finding. Chronic findings as above with bilateral basal ganglia lacunar infarcts.  Chronic left sphenoid sinusitis.   Electronically Signed   By: Christiana Pellant M.D.   On: 03/05/2013 09:45      Assessment/Plan Active Problems:   CAD (coronary artery disease)   DM type 2 (diabetes mellitus,  type 2)   Dementia   Urinary tract infection   1. UTI- culture pending, rocephin daily 2. AMS from UTI and dementia 3. Dementia- B12 ok, recheck TSH- suspect vascular  dementia, has small vessel ischemic change on CT head 4. Low TSH- recheck with T3, T4 5. DM- SSI 6. CAD- stable on home meds  Consider a palliative care consult as patient probably meets hospice criteria and this may support son at discharge and help with dementia expectations/education    Code Status: dnr Family Communication: son at bedside Disposition Plan:   Time spent: 75 min  Marlin Canary [facility] Pager [phone]

## 2013-03-05 NOTE — ED Notes (Signed)
Son reports pt hasn't slept much past 3 days. Found awake in middle of night standing in the corner of room, unable to dress herself. Denies cold, cough. Does state pt fell a few days ago.  Pt with no voiced complaints, denies any pain. EMS reported pt ambulatory from couch to stretcher with standby assistance.

## 2013-03-06 DIAGNOSIS — G9340 Encephalopathy, unspecified: Secondary | ICD-10-CM | POA: Diagnosis present

## 2013-03-06 LAB — CBC
HEMATOCRIT: 34.2 % — AB (ref 36.0–46.0)
HEMOGLOBIN: 11.5 g/dL — AB (ref 12.0–15.0)
MCH: 29.3 pg (ref 26.0–34.0)
MCHC: 33.6 g/dL (ref 30.0–36.0)
MCV: 87 fL (ref 78.0–100.0)
Platelets: 197 10*3/uL (ref 150–400)
RBC: 3.93 MIL/uL (ref 3.87–5.11)
RDW: 13.7 % (ref 11.5–15.5)
WBC: 6.5 10*3/uL (ref 4.0–10.5)

## 2013-03-06 LAB — GLUCOSE, CAPILLARY
GLUCOSE-CAPILLARY: 134 mg/dL — AB (ref 70–99)
GLUCOSE-CAPILLARY: 283 mg/dL — AB (ref 70–99)
Glucose-Capillary: 145 mg/dL — ABNORMAL HIGH (ref 70–99)

## 2013-03-06 LAB — BASIC METABOLIC PANEL
BUN: 12 mg/dL (ref 6–23)
CO2: 21 meq/L (ref 19–32)
Calcium: 8.2 mg/dL — ABNORMAL LOW (ref 8.4–10.5)
Chloride: 99 mEq/L (ref 96–112)
Creatinine, Ser: 0.93 mg/dL (ref 0.50–1.10)
GFR calc Af Amer: 63 mL/min — ABNORMAL LOW (ref >90)
GFR calc non Af Amer: 54 mL/min — ABNORMAL LOW (ref >90)
Glucose, Bld: 140 mg/dL — ABNORMAL HIGH (ref 70–99)
POTASSIUM: 3.8 meq/L (ref 3.7–5.3)
SODIUM: 132 meq/L — AB (ref 137–147)

## 2013-03-06 LAB — URINE CULTURE
CULTURE: NO GROWTH
Colony Count: NO GROWTH

## 2013-03-06 LAB — T4, FREE: Free T4: 1.34 ng/dL (ref 0.80–1.80)

## 2013-03-06 LAB — TSH: TSH: 0.267 u[IU]/mL — ABNORMAL LOW (ref 0.350–4.500)

## 2013-03-06 LAB — T3: T3, Total: 109.2 ng/dl (ref 80.0–204.0)

## 2013-03-06 MED ORDER — HALOPERIDOL LACTATE 5 MG/ML IJ SOLN
2.5000 mg | Freq: Once | INTRAMUSCULAR | Status: AC
  Administered 2013-03-06: 2.5 mg via INTRAVENOUS
  Filled 2013-03-06: qty 1

## 2013-03-06 MED ORDER — HYDRALAZINE HCL 20 MG/ML IJ SOLN: 5.0000 mg | Freq: Four times a day (QID) | INTRAMUSCULAR | Status: DC | PRN

## 2013-03-06 MED ORDER — ENOXAPARIN SODIUM 40 MG/0.4ML ~~LOC~~ SOLN
40.0000 mg | SUBCUTANEOUS | Status: DC
  Administered 2013-03-07: 40 mg via SUBCUTANEOUS
  Filled 2013-03-06 (×2): qty 0.4

## 2013-03-06 NOTE — Progress Notes (Signed)
Patient is back in bed, and patient is calm; will continue to monitor patient. Lorretta Harp RN

## 2013-03-06 NOTE — Progress Notes (Signed)
Patient is very confused and setting off bed alarm by getting out of bed, she now refuses to get back in bed and wants to leave the room to find son who has step out and will be back.  Patient reoriented and informed that she can not leave the hospital because she is being treated and is still under physician care.  "I want to get out of here", states the patient. The patient will not stay in bed and wants to leave.  MD notified and orders given, will continue to monitor patient. Lorretta Harp RN

## 2013-03-06 NOTE — Progress Notes (Signed)
Thank you for consulting the Palliative Medicine Team at [facility] to meet your patient's and family's needs.   The reason that you asked [place] to see your patient is  For GOC  We have scheduled your patient for a meeting:  1/26 9 am  The Surrogate decision make is: Son Rosanne Ashing Contact information: see face sheet, called to room.  Other family members that need to be present: NA    Your patient is able/unable to participate: unable  Additional Narrative: Patient lives at home with son.  Son wants to take her home.  Tandra Rosado L. Ladona Ridgel, MD MBA The Palliative Medicine Team at [facility] Phone: [phone] Pager: [phone]

## 2013-03-06 NOTE — Progress Notes (Signed)
Utilization Review Completed.   Emilo Gras, RN, BSN Nurse Case Manager  

## 2013-03-06 NOTE — Progress Notes (Signed)
TRANSFER NOTE from hospitalist service to IMTS    Subjective: [name] is doing about the same this morning.  Did not sleep well per her son.  She also had an episode of urinary incontinence yesterday evening when on the way to the bathroom.    Patient is confused at baseline though her son feels this has worsened even in past month.  This morning, she would smile and speak occasionally.  Denied pain, when asked if there was anything else she wanted to add, said "just that I am really enjoying it."  Objective: Vital signs in last 24 hours: Filed Vitals:   03/05/13 1257 03/05/13 1539 03/05/13 2057 03/06/13 0523  BP: 181/72 132/58 158/62 143/75  Pulse: 75 76 79 74  Temp: 97.7 F (36.5 C) 98.6 F (37 C) 98.6 F (37 C) 98.5 F (36.9 C)  TempSrc: Axillary Axillary Axillary Oral  Resp: 19 20 20 18   SpO2: 95% 99% 99% 98%   Weight change:   Intake/Output Summary (Last 24 hours) at 03/06/13 0820 Last data filed at 03/06/13 0525  Gross per 24 hour  Intake    360 ml  Output      0 ml  Net    360 ml   PEX General: alert, cooperative, NAD, sleepy HEENT: NCAT, vision grossly intact, oropharynx clear and non-erythematous  Neck: supple, no lymphadenopathy Lungs: clear to ascultation bilaterally, normal work of respiration, no wheezes, rales, ronchi Heart: 2-3/6 systolic murmur, regular rate and rhythm (paced), no gallops or rubs Abdomen: soft, non-tender, non-distended, normal bowel sounds Extremities: 2+ DP/PT pulses bilaterally, no cyanosis, clubbing, or edema Neurologic: alert & oriented X3, cranial nerves II-XII intact, strength grossly intact, sensation intact to light touch  Lab Results: Basic Metabolic Panel:  Recent Labs Lab 03/05/13 0834 03/06/13 0630  NA 133* 132*  K 4.4 3.8  CL 97 99  CO2 22 21  GLUCOSE 191* 140*  BUN 13 12  CREATININE 0.98 0.93  CALCIUM 8.7 8.2*  Corrected Ca 8.7  Liver Function Tests:  Recent Labs Lab 03/05/13 0834  AST 21  ALT 13    ALKPHOS 53  BILITOT 0.7  PROT 6.0  ALBUMIN 3.4*    Recent Labs Lab 03/05/13 0834  AMMONIA 23   CBC:   Recent Labs Lab 03/05/13 0834 03/06/13 0630  WBC 7.6 6.5  NEUTROABS 5.3  --   HGB 11.9* 11.5*  HCT 34.6* 34.2*  MCV 86.3 87.0  PLT 195 197   Cardiac Enzymes:  Recent Labs Lab 03/05/13 1440  TROPONINI <0.30   CBG:  Recent Labs Lab 03/05/13 0854 03/05/13 1251 03/05/13 1625 03/05/13 2109 03/06/13 0619  GLUCAP 154* 145* 156* 73 134*   Urinalysis:  Recent Labs Lab 03/05/13 0850  COLORURINE YELLOW  LABSPEC 1.012  PHURINE 6.0  GLUCOSEU NEGATIVE  HGBUR LARGE*  BILIRUBINUR NEGATIVE  KETONESUR NEGATIVE  PROTEINUR 100*  UROBILINOGEN 0.2  NITRITE NEGATIVE  LEUKOCYTESUR LARGE*    Studies/Results: Dg Chest 2 View  03/05/2013   CLINICAL DATA:  Altered mental status  EXAM: CHEST  2 VIEW  COMPARISON:  02/09/2013  FINDINGS: Left-sided pacer in place. Evidence of CABG again noted. Heart size at upper limits of normal. Patchy left basilar airspace disease is reidentified. Interval resolution of previously seen pleural effusions. Clips are partly visualized over the upper abdomen on the lateral projection with aortic calcification without calcified aneurysm.  IMPRESSION: Stable patchy left lower lobe airspace opacity which could represent atelectasis although slow to resolve pneumonia could appear  similar. Followup to radiographic resolution is recommended in 4-6 weeks.  Resolution of previously seen pleural effusion.   Electronically Signed   By: Christiana Pellant M.D.   On: 03/05/2013 10:16   Ct Head Wo Contrast  03/05/2013   CLINICAL DATA:  Confusion, altered mental status for several months  EXAM: CT HEAD WITHOUT CONTRAST  TECHNIQUE: Contiguous axial images were obtained from the base of the skull through the vertex without intravenous contrast.  COMPARISON:  05/18/2011  FINDINGS: Mild cortical volume loss noted with proportional ventricular prominence. Areas of  periventricular white matter hypodensity are most compatible with small vessel ischemic change. No acute hemorrhage, infarct, or mass lesion is identified. Interval development of right basal ganglial lacunar infarct. No midline shift. Left external capsule lacunar infarct reidentified. Motion artifact at the skullbase noted. Partial opacification of the left sphenoid sinus reidentified with internal hyperdense material but could indicate inspissated secretions. No skull fracture.  IMPRESSION: No acute intracranial finding. Chronic findings as above with bilateral basal ganglia lacunar infarcts.  Chronic left sphenoid sinusitis.   Electronically Signed   By: Christiana Pellant M.D.   On: 03/05/2013 09:45   Medications: I have reviewed the patient's current medications. Scheduled Meds: . cefTRIAXone (ROCEPHIN)  IV  1 g Intravenous Q24H  . clopidogrel  75 mg Oral QAC breakfast  . enoxaparin (LOVENOX) injection  40 mg Subcutaneous Q24H  . famotidine  20 mg Oral Daily  . insulin aspart  0-15 Units Subcutaneous TID WC  . insulin aspart  0-5 Units Subcutaneous QHS  . latanoprost  1 drop Left Eye QHS  . metoprolol  25 mg Oral BID  . multivitamin with minerals  1 tablet Oral Daily  . niacin  500 mg Oral BID  . predniSONE  5 mg Oral Q breakfast  . raloxifene  60 mg Oral Daily  . simvastatin  10 mg Oral q1800  . sodium chloride  3 mL Intravenous Q12H  . timolol  1 drop Both Eyes Daily  . trimethoprim  100 mg Oral BID   Continuous Infusions:  PRN Meds:.sodium chloride, acetaminophen, acetaminophen, hydrALAZINE, ondansetron (ZOFRAN) IV, ondansetron, sodium chloride Assessment/Plan: #AMS due to UTI- Patient reportedly had increased urinary incontinence and complaints of dysuria prior to presentation.  UA on admission showed many WBCs, large leucocytes, large heme.  Afebrile, no leucocytosis; patient denies pain both subjectively and on exam today.  She is s/p right nephrectomy and has a history of chronic  UTIs for which she has taken prophylactic trimethoprim for the past several years per patient's son (urologist is Dr. Patsi Sears).  She was recently discharged on 02/10/13 for CAP and UTI though urine culture at that time showed 40,000 colonies/mL of multiple bacteria, none predominant.  Of note, CXR this admission showed stable patchy LLL airspace opacity with could represent atelectasis though slow to resolve PNA could appear similar with recommendation for repeat imagining in 4-6 weeks; resolution of previously seen pleural effusion.  -continue ceftriaxone 1 g IV daily -continue home trimethoprim 100 mg BID -urine culture pending  #Dementia- Likely vascular dementia, patient's baseline mental status unclear.  B12 normal, TSH mildly depressed at 0.3 on 02/10/13.  CT head on admission negative for acute intracranial findings; chronic findings with bilateral basal ganglia lacunar infarcts, chronic left sphenoid sinusitis.  Patient's son prefers to keep her at home but open to PT/OT evals.  -palliative care consult for goals of care, appreciate recs -PT/OT consults -repeat TSH, free T3, T4 pending  #HTN- stable, on  metoprolol at home. -continue metoprolol 25 mg BID   #DM2- A1C 5.8% on 02/10/13.  Patient takes metformin 500 mg BID at home.  -SSI-M -CBGs AC, qhs  #CAD s/p CABG- in 2007, stable.  Continue home Plavix 75 mg daily.   #DVT PPX- lovenox  #Code status- DNR  Dispo: Disposition is deferred at this time, awaiting improvement of current medical problems.  Anticipated discharge in 1-2 days.   The patient does have a current PCP Andrey Campanile Elizebeth Koller, MD) and does need an [facility] hospital follow-up appointment after discharge.   .Services Needed at time of discharge: Y = Yes, Blank = No PT:   OT:   RN:   Equipment:   Other:     LOS: 1 day   Rocco Serene, MD 03/06/2013, 8:20 AM

## 2013-03-07 DIAGNOSIS — M069 Rheumatoid arthritis, unspecified: Secondary | ICD-10-CM

## 2013-03-07 DIAGNOSIS — E785 Hyperlipidemia, unspecified: Secondary | ICD-10-CM

## 2013-03-07 DIAGNOSIS — I25810 Atherosclerosis of coronary artery bypass graft(s) without angina pectoris: Secondary | ICD-10-CM

## 2013-03-07 DIAGNOSIS — I10 Essential (primary) hypertension: Secondary | ICD-10-CM

## 2013-03-07 DIAGNOSIS — Z515 Encounter for palliative care: Secondary | ICD-10-CM

## 2013-03-07 LAB — BASIC METABOLIC PANEL
BUN: 11 mg/dL (ref 6–23)
CO2: 20 mEq/L (ref 19–32)
Calcium: 8.9 mg/dL (ref 8.4–10.5)
Chloride: 97 mEq/L (ref 96–112)
Creatinine, Ser: 0.9 mg/dL (ref 0.50–1.10)
GFR calc Af Amer: 65 mL/min — ABNORMAL LOW (ref >90)
GFR, EST NON AFRICAN AMERICAN: 56 mL/min — AB (ref >90)
Glucose, Bld: 202 mg/dL — ABNORMAL HIGH (ref 70–99)
Potassium: 3.8 mEq/L (ref 3.7–5.3)
SODIUM: 135 meq/L — AB (ref 137–147)

## 2013-03-07 LAB — GLUCOSE, CAPILLARY
GLUCOSE-CAPILLARY: 143 mg/dL — AB (ref 70–99)
GLUCOSE-CAPILLARY: 191 mg/dL — AB (ref 70–99)
Glucose-Capillary: 206 mg/dL — ABNORMAL HIGH (ref 70–99)

## 2013-03-07 MED ORDER — DOXYCYCLINE HYCLATE 100 MG PO TABS
100.0000 mg | ORAL_TABLET | Freq: Two times a day (BID) | ORAL | Status: DC
  Administered 2013-03-07: 100 mg via ORAL
  Filled 2013-03-07 (×2): qty 1

## 2013-03-07 MED ORDER — HALOPERIDOL 1 MG PO TABS: 1.0000 mg | ORAL_TABLET | Freq: Four times a day (QID) | ORAL | Status: AC | PRN

## 2013-03-07 MED ORDER — DOXYCYCLINE HYCLATE 100 MG PO TABS: 100.0000 mg | ORAL_TABLET | Freq: Two times a day (BID) | ORAL | Status: AC

## 2013-03-07 MED ORDER — BISACODYL 10 MG RE SUPP: 10.0000 mg | Freq: Every day | RECTAL | Status: DC | PRN

## 2013-03-07 NOTE — Discharge Instructions (Signed)
Please take all of your medicines as prescribed.  We are stopping your old antibiotic called TRIMETHOPRIM and starting DOXYCYCLINE for one week.   We are also giving you a prescription for HALDOL to use every 6 hours as needed for agitation or help with sleep.   The palliative care doctors are working with hospice to have them come to your home.  You should hear from hospice in the next couple of days.   Don't forget to follow-up with Dr. Jeannetta Nap next week.   Hospice Care  Hospice is a care service which can be used by people who are terminally ill and in whom healing is no longer thought possible. Hospice care is for people believed to have no more than 6 months to live. It is meant to help with the two largest fears near the end of life (the fears of dying and of being alone), as well as pain management, and an attempt to allow people to pass away comfortably at home.  Hospice staff:  Administer appropriate pain relief.  Provide nursing care.  Offer reassurance and support to loved ones and family members.  Provide services to keep people comfortable at home or in a hospice facility. Together, you can see to it that your loved one is not alone during this last and important phase of life. You, your family, and your caregivers help you decide when hospice services should begin. If your condition improves or the disease goes into remission, you can be discharged from the hospice program. You can return to hospice care at a later time if needed. The hospice philosophy recognizes death as the final stage of life. It helps patients continue an alert, pain-free life, and manage symptoms while surrounded by their loved ones. Hospice affirms life without hurrying death. Hospice care treats the person rather than the disease. It emphasizes quality of life with family-centered care. Hospice care involves the patient and family and helps them make decisions.  The care is designed to:  Relieve or  decrease pain.  Control other problems.  Provide as much quality time as possible.  Allow people to die with dignity. Unlike other medical care, the focus is no longer on curing disease. The goal of hospice care is to offer as high a quality of life as possible during the end of life. In this way, the last days of life may be spent with dignity.  With hospice care, instead of spending the last weeks or months in a hospital, a person is with loved ones in the home or a homelike setting. About 90 percent of hospice care is provided at home. But hospice is available wherever a person lives, including a nursing home or assisted-living residence. Some residential hospices designed specifically for hospice care also exist. Hospice care is available for many types of terminal illnesses. Hospice services are meant to serve both the patient and family members.  Comfort. In most cases, the individual stays in his or her home or in homelike surroundings instead of in a hospital. The core of hospice is a cooperative effort by family, friends and a team of professional and volunteer caregivers working together to meet your loved one's needs. This team supplies all necessary medicines and equipment. It works with both the person involved and family members to relieve pain and symptoms.  Support. Individuals enjoy the support of loved ones by receiving much of the basic care from family and friends. A nurse may lead the team and coordinates the day-to-day  care. A doctor is also part of the team. Chaplains and social workers are available to counsel the family and their loved one. They make sure emotional, spiritual, and social needs are being met. Trained volunteers perform a wide variety of tasks as needed, such as:  Providing companionship.  Doing light housekeeping.  Preparing meals.  Running errands.  Providing respite for the family.  Improving quality of life. Caring for someone who is dying is  emotionally and physically demanding. This can be particularly true for family members who are primary caregivers. But you can take comfort in knowing that hospice is an act of love that can improve the quality of life for all involved. Professionals are often available to tend to the needs of grieving family members as well.  Spiritual Care. Hospice care emphasizes the spiritual needs of you and your family. People differ in their spiritual needs and religious beliefs so spiritual care is individualized to meet the persons' and family's needs. It may include helping you to look at what death means to you, to say good-bye, or to perform a specific religious ceremony or ritual. HOW TO SELECT A PROGRAM Most hospice programs are run by nonprofit, independent organizations. Some are affiliated with hospitals, nursing homes or home health care agencies. Some are for-profit organizations. You can learn about existing hospice programs in your area from your health caregivers. ASK THE FOLLOWING:  What services are available to the patient?  What services are offered to the family?  Are bereavement services available?  How involved are the family members?  How involved is the doctor?  Who makes up the hospice care team? How are they trained or screened?  How will the individual's pain and symptoms be managed?  If circumstances change, can services be provided in different settings, such as the home or the hospital?  Is the program reviewed and licensed by the state or certified in some other way?  Are all costs covered by insurance? How much you pay for hospice care can vary greatly. It depends on the length and type of care necessary and your insurance coverage. Medicare and most private insurance plans, including managed care organizations, cover hospice care. Hospice is also covered by Medicaid in most states. Some hospice programs provide services on a sliding fee scale, based on your ability  to pay. They may also provide some durable medical equipment for support within the home. Document Released: 05/16/2003 Document Revised: 04/21/2011 Document Reviewed: 01/27/2005 [facility] Patient Information 2014 [place], [place].

## 2013-03-07 NOTE — Progress Notes (Signed)
Nutrition Brief Note  Pt identified as at nutrition risk on the Malnutrition Screen Tool Chart reviewed. Pt now transitioning to comfort care.  No further nutrition interventions warranted at this time.  Please re-consult as needed.   Kendell Bane RD, LDN, CNSC [phone] Pager [phone] After Hours Pager

## 2013-03-07 NOTE — Progress Notes (Signed)
Patient discharged home with son with home health and hospice. Son in shocked that mother is declining so fast and very tearful throughout the day. Discharge instructions given and understands that prescriptions are called in to pharmacy. Patient shows no s/sx of distress.

## 2013-03-07 NOTE — Progress Notes (Signed)
  Date: 03/07/2013  Patient name: [name]  Medical record number: [id number]  Date of birth: [birth year]   This patient has been seen and the plan of care was discussed with the house staff. Please see their note for complete details. I concur with their findings with the following additions/corrections: Son is interested in hospice. Appreciate palliative care input. She has underlying dementia, but recent worsening in mental status per son. Of note, she has sterile pyuria and this has been present for months. No leukocytosis, no fever. I don't think she has PNA or partially treated pneumonia at this time. I would consider treatment with doxycycline for her urinary findings, as this could be a recurrent UTI that is causing mental status changes. I would treat for 7-10 days, as her hx of kidney stones puts her at risk for organisms that may not grow well on our media. She appears clinically well. She is stable for D/C with home health care/hospice per son requests.  Jonah Blue, DO, FACP Faculty [facility] 03/07/2013, 3:44 PM

## 2013-03-07 NOTE — Discharge Summary (Signed)
Name: LAURAINE CRESPO MRN: [id number] DOB: 31-Jan-1925 80 y.o. PCP: Kaleen Mask, MD  Date of Admission: 03/05/2013  8:22 AM Date of Discharge: 03/07/2013 Attending Physician: Dr. Kem Kays  Discharge Diagnosis: Principal Problem:   Acute encephalopathy Active Problems:   CAD (coronary artery disease)   HTN (hypertension), benign   DM type 2 (diabetes mellitus, type 2)   Dementia   Urinary tract infection   Palliative care encounter  Discharge Medications:   Medication List    STOP taking these medications       trimethoprim 100 MG tablet  Commonly known as:  TRIMPEX      TAKE these medications       ACIDOPHILUS PO  Take 460 mg by mouth daily.     CALCIUM 500 PO  Take 1 tablet by mouth daily.     clopidogrel 75 MG tablet  Commonly known as:  PLAVIX  Take 1 tablet (75 mg total) by mouth daily before breakfast.     doxycycline 100 MG tablet  Commonly known as:  VIBRA-TABS  Take 1 tablet (100 mg total) by mouth every 12 (twelve) hours.     estradiol 2 MG vaginal ring  Commonly known as:  ESTRING  Place 2 mg vaginally every 3 (three) months. follow package directions     haloperidol 1 MG tablet  Commonly known as:  HALDOL  Take 1 tablet (1 mg total) by mouth every 6 (six) hours as needed for agitation.     HYDROcodone-acetaminophen 5-500 MG per tablet  Commonly known as:  VICODIN  Take 1 tablet by mouth every 6 (six) hours as needed. For pain.     ICAPS PO  Take 1 capsule by mouth daily.     latanoprost 0.005 % ophthalmic solution  Commonly known as:  XALATAN  Place 1 drop into the left eye at bedtime.     lovastatin 20 MG tablet  Commonly known as:  MEVACOR  Take 20 mg by mouth every evening.     metFORMIN 500 MG tablet  Commonly known as:  GLUCOPHAGE  Take 500 mg by mouth 2 (two) times daily with a meal.     methenamine 1 G tablet  Commonly known as:  HIPREX  Take 1 g by mouth QID.     metoprolol 50 MG tablet  Commonly known as:  LOPRESSOR    Take 0.5 tablets (25 mg total) by mouth 2 (two) times daily.     multivitamin with minerals Tabs tablet  Take 1 tablet by mouth daily.     niacin 500 MG tablet  Take 500 mg by mouth 2 (two) times daily.     nitroGLYCERIN 0.4 MG SL tablet  Commonly known as:  NITROSTAT  Place 0.4 mg under the tongue every 5 (five) minutes as needed. For chest pain.     predniSONE 5 MG tablet  Commonly known as:  DELTASONE  Take 5 mg by mouth daily with breakfast.     raloxifene 60 MG tablet  Commonly known as:  EVISTA  Take 60 mg by mouth daily.     ranitidine 300 MG tablet  Commonly known as:  ZANTAC  Take 150 mg by mouth 2 (two) times daily.     SYSTANE OP  Apply 1-2 drops to eye as needed. For eye irritation     timolol 0.5 % ophthalmic solution  Commonly known as:  TIMOPTIC  Place 1 drop into both eyes daily.     URIBEL 118 MG  Caps  Take 1 capsule by mouth 4 (four) times daily as needed (usually takes 3 times daily for urinary urgency).     Vitamin D (Ergocalciferol) 50000 UNITS Caps capsule  Commonly known as:  DRISDOL  Take 50,000 Units by mouth every 14 (fourteen) days.        Disposition and follow-up:   Ms.Katonya F Todorov was discharged from [facility] in Stable condition.  At the hospital follow up visit please address:  1.  Resolution of dysuria, medication compliance with doxycycline  2.  Status of home hospice   3.  Labs / imaging needed at time of follow-up: Repeat CXR in 04/2013  4.  Pending labs/ test needing follow-up: none  Follow-up Appointments: Follow-up Information   Follow up with Kaleen Mask, MD On 03/15/2013. (at 11am)    Specialty:  Family Medicine   Contact information:   [address] [place] [address] [phone]       Discharge Instructions: Discharge Orders   Future Orders Complete By Expires   Call MD for:  difficulty breathing, headache or visual disturbances  As directed    Diet - low sodium heart  healthy  As directed    Increase activity slowly  As directed       Consultations: palliative care  Procedures Performed:  Dg Chest 2 View  03/05/2013   CLINICAL DATA:  Altered mental status  EXAM: CHEST  2 VIEW  COMPARISON:  02/09/2013  FINDINGS: Left-sided pacer in place. Evidence of CABG again noted. Heart size at upper limits of normal. Patchy left basilar airspace disease is reidentified. Interval resolution of previously seen pleural effusions. Clips are partly visualized over the upper abdomen on the lateral projection with aortic calcification without calcified aneurysm.  IMPRESSION: Stable patchy left lower lobe airspace opacity which could represent atelectasis although slow to resolve pneumonia could appear similar. Followup to radiographic resolution is recommended in 4-6 weeks.  Resolution of previously seen pleural effusion.   Electronically Signed   By: Christiana Pellant M.D.   On: 03/05/2013 10:16   Dg Chest 2 View  02/09/2013   CLINICAL DATA:  Cough, confusion  EXAM: CHEST  2 VIEW  COMPARISON:  09/06/2012  FINDINGS: Cardiomediastinal silhouette is stable. Dual lead cardiac pacemaker in place. Central mild bronchitic changes. Small left pleural effusion with left basilar atelectasis or infiltrate. Osteopenia and mild degenerative changes thoracic spine. Atherosclerotic calcifications of thoracic aorta.  IMPRESSION: Central mild bronchitic changes. Small left pleural effusion with left basilar atelectasis or infiltrate.   Electronically Signed   By: Natasha Mead M.D.   On: 02/09/2013 09:37   Ct Head Wo Contrast  03/05/2013   CLINICAL DATA:  Confusion, altered mental status for several months  EXAM: CT HEAD WITHOUT CONTRAST  TECHNIQUE: Contiguous axial images were obtained from the base of the skull through the vertex without intravenous contrast.  COMPARISON:  05/18/2011  FINDINGS: Mild cortical volume loss noted with proportional ventricular prominence. Areas of periventricular white  matter hypodensity are most compatible with small vessel ischemic change. No acute hemorrhage, infarct, or mass lesion is identified. Interval development of right basal ganglial lacunar infarct. No midline shift. Left external capsule lacunar infarct reidentified. Motion artifact at the skullbase noted. Partial opacification of the left sphenoid sinus reidentified with internal hyperdense material but could indicate inspissated secretions. No skull fracture.  IMPRESSION: No acute intracranial finding. Chronic findings as above with bilateral basal ganglia lacunar infarcts.  Chronic left sphenoid sinusitis.  Electronically Signed   By: Christiana Pellant M.D.   On: 03/05/2013 09:45    Admission HPI:  JACQUELLINE FECHTER is a 80 y.o. Female who lives with her son at home. For 1 day, she has been having increasing confusion. She has dementia and requires 24 hour care by the son and sometimes hired caregivers. Son plans on keeping her at home as long as he can. Son reports that her confusion has worsened and she has been urinating on herself more.  She was recently in the hospital, discharge 1/1 with CAP and UTI. Dementia work up was started at that time- B12 and TSH- B12 normal and TSH mildly low- will recheck along with T3, T4.  Per talking with son, it appears that patient has gradually been worsening for years- now unable to dress/bathe herself.  In the ER, patient was found to have a UTI- hospitalist were asked to admit for UTI.    Hospital Course by problem list: 1. AMS due to UTI- Patient reportedly had increased urinary incontinence and complaints of dysuria prior to presentation.  UA on admission showed many WBCs, large leucocytes, large heme.  Urine culture with no growth.  GIven repeated UAs with pyruria, possible that patient is infected with bacteria that are difficult to culture (such as ureaplasmin urealyticum and mycoplasma) which would be treated with doxycycline.  For this reason, prescribed  doxycycline 100 mg BID x 7 days at discharge; patient's son amenable with this plan.  Patient remained afebrile with no leucocytosis (though patient immunosuppressed due to chronic steroid use); she repeatedly denied pain both subjectively and on exam (though she has advanced dementia, see below).   Patient is s/p right nephrectomy and has a history of chronic UTIs for which she has taken prophylactic trimethoprim for the past several years per patient's son (urologist is Dr. Patsi Sears); trimethoprim was discontinued while inpatient and held at discharge.  Patient was also recently discharged on 02/10/13 for CAP and UTI, treated with levofloxacin x 7 days for both.  Urine culture at that time showed 40,000 colonies/mL of multiple bacteria, none predominant.  Of note, CXR this admission showed stable patchy LLL airspace opacity with could represent atelectasis though slow to resolve PNA could appear similar; recommendation for repeat imaging in 4-6 weeks.  Outpatient PCP follow-up arranged prior to discharge.   2. Dementia- Likely vascular dementia, patient's baseline mental status unclear though patient's son states she is worsening over time, especially in past month.  CT head on admission negative for acute intracranial findings; chronic findings with bilateral basal ganglia lacunar infarcts, chronic left sphenoid sinusitis.  B12 normal, TSH mildly depressed at 0.3 on 02/10/13.  Repeat TSH 0.237 thus continues to be depressed.  However, T3 109.2, T4 1.34 (within normal limits).  Palliative care was consulted for goals of care discussion given patient' declining mental status.  Per palliative, patient's son prefers for her to be comfort care, palliative to arrange home hospice; also recommend Haldol 1 mg q6h prn for agitation and sleep, rx provided at discharge.  Of note, with regard to QT prolonging medications for agitation, patient is AV paced (due to history of complete heart block) thus QTC interval less  relevant.   3. HTN- stable, continued metoprolol 25 mg BID while inpatient and at discharge.   4. DM2- A1C 5.8% on 02/10/13. Patient takes metformin 500 mg BID at home.  She was placed on SSI while inpatient, discharged back on home metformin.   5. CAD s/p CABG-  in 2007, stable. Continued home Plavix 75 mg daily while inpatient and at discharge.   6. HL- continued home niacin, statin while inpatient and at discharge.   7. RA- continued home prednisone 5 mg daily while inpatient and at discharge.    8. Code status- DNR, confirmed this admission   Discharge Vitals:   BP 188/75  Pulse 88  Temp(Src) 98.7 F (37.1 C) (Axillary)  Resp 17  SpO2 97%  Discharge Labs:  Results for orders placed during the hospital encounter of 03/05/13 (from the past 24 hour(s))  GLUCOSE, CAPILLARY     Status: Abnormal   Collection Time    03/07/13 11:29 AM      Result Value Range   Glucose-Capillary 206 (*) 70 - 99 mg/dL   Comment 1 Notify RN     Comment 2 Documented in Chart      Signed: Rocco Serene, MD 03/08/2013, 11:07 AM   Time Spent on Discharge: 40 minutes Services Ordered on Discharge: home hospice Equipment Ordered on Discharge: none

## 2013-03-07 NOTE — Care Management Note (Signed)
CARE MANAGEMENT NOTE 03/07/2013  Patient:  [name], [name]   Account Number:  [id number]  Date Initiated:  03/07/2013  Documentation initiated by:  Vance Peper  Subjective/Objective Assessment:   79 yr old female admitted with UTI and Advanced dementia     Action/Plan:   Case manager spoke with patient's son, Breuna Loveall offered choice for in home hospice care. Contacted Marge with [facility]. Faxed info requested to intake at Hos[facility].   Anticipated DC Date:  03/07/2013   Anticipated DC Plan:  HOME W HOSPICE CARE      DC Planning Services  CM consult      PAC Choice  HOSPICE   Choice offered to / List presented to:  C-4 Adult Children           Status of service:  In process, will continue to follow Medicare Important Message given?

## 2013-03-07 NOTE — Progress Notes (Signed)
Occupational Therapy Discharge Patient Details Name: [name] MRN: [id number] DOB: 1925-01-23 Today's Date: 03/07/2013 Time:  -     Patient discharged from OT services secondary to Palliative Medicine team met with family today & per chart review, pt to be put on comfort measures at this time w/ home hospice support anticipated. Will sign off acute OT at this time. Also noted d/c orders in pt chart.     Plan: Acknowledge acute OT d/c orders in chart. Sign off.  GO     Almyra Deforest 03/07/2013, 11:10 AM

## 2013-03-07 NOTE — Consult Note (Signed)
Patient XT:[name]      DOB: 1924-08-17      BDZ:[id number]     Consult Note from the Palliative Medicine Team at [facility] Requested by: Dr. Murlean Caller     PCP: Leonard Downing, MD Reason for Consultation: Clarification of [place] and options.  Phone Number:[phone]  Assessment of patients Current state: 80 yo female with acute UTI and worsening vascular dementia. She lives with her son [name] [phone]) who I met with today. [name] has seen his mother decline since last Spring with increasing falls and increasing confusion, and now decreased appetite and incontinence. We discussed the natural trajectory of her vascular dementia and [name] understands but is struggling with losing his mother. He clearly understands this is a terminal disease. She has been hospitalized the beginning of the year with UTI (and hypoglycemia) and has now been hospitalized again with UTI within 1 month. [name] understands that UTI, PNA, and falls are complications that occur with dementia. He understands that her swallowing will become a risk and wishes to pursue comfort feeds in that scenario. [name] is very tearful during our conversation but he does understand the reality of his mother's disease and wishes for her to be made comfortable.   We did complete a MOST form in which [name] decides DNR-confirmed, he wishes her to be at home on comfort measures and not be transferred back to the hospital (hopeful that hospice can help support this goal by managing symptoms at home), NO feeding tube, and he would like to consider/determine use/limitation antibiotics and IV fluids. We did discuss that IV fluids and antibiotics, if desired, would likely be done in hospital. [name] is leaning more towards no IV fluids at this time but would like to think about this subject. He also understands that we can give antibiotics but mouth but that is not a guarantee if she cannot swallow them. He agrees that he does not want to prolong her  suffering and I believe that he will be able to make more firm decisions regarding these matters as he sees her decline. MOST and golden DNR also placed on chart. [name] has taken care of his mother and wishes for her to die at home. Care management consult to offer hospice choice for in the home.    Goals of Care: 1.  Code Status: DNR   2. Scope of Treatment: 1. Vital Signs: daily 2. AICD: no  3. Respiratory/Oxygen: for comfort 4. Nutritional Support/Tube Feeds: no 5. Antibiotics: to be determined on outcomes 6. IVF: to be determined on outcomes 7. Review of Medications to be discontinued: none as of now 8. Labs: no 9. Telemetry: no 10. Consults: care management   4. Disposition: Home with son and hopefully hospice support.    3. Symptom Management:   1. Agitation/Sleep: Haldol 1 mg po q6h prn for agitation and sleep.  2. Pain: Continue home Vicodin po q6h prn pain.  3. Bowel Regimen: Dulcolax supp daily prn.  4. Nausea/Vomiting: Ondansetron prn.   4. Psychosocial: Emotional support provided to son who is struggling with his mother's terminal illness.   5. Spiritual: Support provided from [employer].    Patient Documents Completed or Given: Document Given Completed  Advanced Directives Pkt    MOST  yes  DNR  yes  Gone from [facility]    Hard Choices yes     Brief HPI: 80 yo female with worsening vascular dementia.   ROS: Unable to  elicit - Ms. Memoli only laughs and smiles.     PMH:  Past Medical History  Diagnosis Date  . Pacemaker   . CAD (coronary artery disease)   . CHB (complete heart block)   . HTN (hypertension), benign   . DM (diabetes mellitus)   . Hypertrophic cardiomyopathy   . Hyperlipidemia   . OP (osteoporosis)   . Hx: UTI (urinary tract infection)   . Fatigue   . Rheumatoid arthritis(714.0)   . Chronic steroid use   . Confusion   . Pancreatic pseudocyst   . Diverticulosis   . Shortness of breath   . Bruises easily   .  GERD (gastroesophageal reflux disease)   . History of IBS   . Renal calculus     has stent  . H/O unilateral nephrectomy   . Blood transfusion   . Difficulty sleeping     takes nightly med for sleep  . Complication of anesthesia     CONFUSION     PSH: Past Surgical History  Procedure Laterality Date  . Cardiac catheterization  04/17/2005    EF 65%  . Septal myectomy    . Nephrectomy      RIGHT  . Arm fracture surgery      RIGHT ARM  . Coronary artery bypass graft  04/2005    LIMA GRAFT TO THE DISTAL LAD, SAPHENOUS VEIN GRAFT TO THE DIAGONAL, SAPHENOUS VEIN GRAFT TO THE INTERMEDIATE BRANCH, AND SAPHENOUS VEIN GRAFT TO THE DISTAL RIGHT CORONARY  . Myomectomy    . Cataract extraction, bilateral    . Cystoscopy  05/07/2011    Procedure: CYSTOSCOPY;  Surgeon: Dutch Gray, MD;  Location: [facility];  Service: Urology;  Laterality: Left;  . Insert / replace / remove pacemaker  2006  . Cystoscopy/retrograde/ureteroscopy/stone extraction with basket  07/17/2011    Procedure: CYSTOSCOPY/RETROGRADE/URETEROSCOPY/STONE EXTRACTION WITH BASKET;  Surgeon: Ailene Rud, MD;  Location: [facility];  Service: Urology;  Laterality: Left;  Cystoscopy, Left Ureteroscopy, Retrograde Pyelogram, Stone Extraction, Holmium Laser Lithotripsy, Stent Exchange with Polaris Stent    . Cystoscopy w/ ureteral stent placement  07/17/2011    Procedure: CYSTOSCOPY WITH STENT REPLACEMENT;  Surgeon: Ailene Rud, MD;  Location: [facility];  Service: Urology;  Laterality: Left;  USE POLARIS STENT   I have reviewed the FH and SH and  If appropriate update it with new information. Allergies  Allergen Reactions  . Codeine Other (See Comments)    Blacks out   Scheduled Meds: . clopidogrel  75 mg Oral QAC breakfast  . doxycycline  100 mg Oral Q12H  . enoxaparin (LOVENOX) injection  40 mg Subcutaneous Q24H  . famotidine  20 mg Oral Daily  . insulin aspart  0-15 Units Subcutaneous TID WC  . insulin aspart  0-5 Units  Subcutaneous QHS  . latanoprost  1 drop Left Eye QHS  . metoprolol  25 mg Oral BID  . multivitamin with minerals  1 tablet Oral Daily  . niacin  500 mg Oral BID  . predniSONE  5 mg Oral Q breakfast  . raloxifene  60 mg Oral Daily  . simvastatin  10 mg Oral q1800  . sodium chloride  3 mL Intravenous Q12H  . timolol  1 drop Both Eyes Daily   Continuous Infusions:  PRN Meds:.sodium chloride, acetaminophen, acetaminophen, ondansetron (ZOFRAN) IV, ondansetron, sodium chloride    BP 188/75  Pulse 88  Temp(Src) 98.7 F (37.1 C) (Axillary)  Resp 17  SpO2 97%  PPS: 30%   Intake/Output Summary (Last 24 hours) at 03/07/13 0945 Last data filed at 03/07/13 0400  Gross per 24 hour  Intake    606 ml  Output      4 ml  Net    602 ml   LBM: 03/04/13              Physical Exam:  General: NAD, pleasant, thin, fraile HEENT: [place]/AT, no JVD, moist mucous membranes Chest: CTA throughout, no labored breathing CVS:  Pacemaker - RRR, systolic murmur  Abdomen: Soft, slightly tender to touch, ND, +BS Ext: MAE, no edema Neuro: Intermittently alert, oriented to person (although unable to tell me her last name)  Labs: CBC    Component Value Date/Time   WBC 6.5 03/06/2013 0630   RBC 3.93 03/06/2013 0630   HGB 11.5* 03/06/2013 0630   HCT 34.2* 03/06/2013 0630   PLT 197 03/06/2013 0630   MCV 87.0 03/06/2013 0630   MCH 29.3 03/06/2013 0630   MCHC 33.6 03/06/2013 0630   RDW 13.7 03/06/2013 0630   LYMPHSABS 1.2 03/05/2013 0834   MONOABS 1.1* 03/05/2013 0834   EOSABS 0.0 03/05/2013 0834   BASOSABS 0.0 03/05/2013 0834    BMET    Component Value Date/Time   NA 135* 03/07/2013 0605   K 3.8 03/07/2013 0605   CL 97 03/07/2013 0605   CO2 20 03/07/2013 0605   GLUCOSE 202* 03/07/2013 0605   BUN 11 03/07/2013 0605   CREATININE 0.90 03/07/2013 0605   CALCIUM 8.9 03/07/2013 0605   GFRNONAA 56* 03/07/2013 0605   GFRAA 65* 03/07/2013 0605    CMP     Component Value Date/Time   NA 135* 03/07/2013 0605   K 3.8  03/07/2013 0605   CL 97 03/07/2013 0605   CO2 20 03/07/2013 0605   GLUCOSE 202* 03/07/2013 0605   BUN 11 03/07/2013 0605   CREATININE 0.90 03/07/2013 0605   CALCIUM 8.9 03/07/2013 0605   PROT 6.0 03/05/2013 0834   ALBUMIN 3.4* 03/05/2013 0834   AST 21 03/05/2013 0834   ALT 13 03/05/2013 0834   ALKPHOS 53 03/05/2013 0834   BILITOT 0.7 03/05/2013 0834   GFRNONAA 56* 03/07/2013 0605   GFRAA 65* 03/07/2013 0605     Time In Time Out Total Time Spent with Patient Total Overall Time  0900 1010 25mn 742m    Greater than 50%  of this time was spent counseling and coordinating care related to the above assessment and plan.  AlVinie SillNP Palliative Medicine Team Team Phone # [phone] Discussed with Dr. RoStann Mainland

## 2013-03-07 NOTE — Progress Notes (Signed)
Pt  Seen for home with [facility] - hospice home care services.  Referred by CM RN Darl Pikes.   CM RN to order Package D  - hospital electric bed, 1/2 rails, APP mattress and OBT.  BSC.    Pt does not need DME prior to discharge and will go home by private car today with son.    [facility] RN will see pt in am for admission.    If you have any questions, please call [phone]  (24/7).  Thank you, Chalmers Cater, RN [facility]

## 2013-03-07 NOTE — Evaluation (Signed)
Physical Therapy Evaluation Patient Details Name: [name] MRN: [id number] DOB: 1924/10/07 Today's Date: 03/07/2013 Time: 1010-1031 PT Time Calculation (min): 21 min  PT Assessment / Plan / Recommendation History of Present Illness  Pt is a very pleasant 80 y/o female admitted s/p UTI.   Clinical Impression  PT eval completed prior to discontinue of PT orders. Per chart review, pt to be put on comfort measures at this time with home hospice support anticipated. At the time of PT eval, pt was able to perform transfers, ambulation and other functional mobility with min assist mostly for power-up to come to full stand from a seated position.  Acknowledge PT discontinue orders, however if needs change, please reconsult.      PT Assessment  Patient needs continued PT services    Follow Up Recommendations  No PT follow up    Does the patient have the potential to tolerate intense rehabilitation      Barriers to Discharge        Equipment Recommendations  None recommended by PT    Recommendations for Other Services     Frequency Min 2X/week    Precautions / Restrictions Precautions Precautions: Fall Restrictions Weight Bearing Restrictions: No   Pertinent Vitals/Pain Pt reports no pain throughout session.       Mobility  Bed Mobility General bed mobility comments: When PT entered room, pt was standing independently at EOB with son.  Transfers Overall transfer level: Needs assistance Equipment used: 1 person hand held assist Transfers: Sit to/from Stand Sit to Stand: Min assist General transfer comment: Min assist to power up to full stand, however once standing, pt did not require any assist to balance.  Ambulation/Gait Ambulation/Gait assistance: Min assist Ambulation Distance (Feet): 90 Feet Assistive device: 1 person hand held assist Gait Pattern/deviations: Step-through pattern;Decreased stride length;Trunk flexed Gait velocity: Decreased Gait velocity  interpretation: Below normal speed for age/gender General Gait Details: Decreased gait speed, mainly due to wanting to look around the hallways while ambulating. Directional cueing required but no LOB. One seated rest break required at 45 feet and one standing rest break required at 75 feet.     Exercises     PT Diagnosis: Generalized weakness  PT Problem List: Decreased strength;Decreased range of motion;Decreased activity tolerance;Decreased balance;Decreased mobility;Decreased knowledge of use of DME;Decreased safety awareness PT Treatment Interventions: DME instruction;Gait training;Stair training;Functional mobility training;Therapeutic activities;Therapeutic exercise;Neuromuscular re-education;Patient/family education     PT Goals(Current goals can be found in the care plan section) Acute Rehab PT Goals Patient Stated Goal: Unable to state goals at this time PT Goal Formulation: With patient/family Time For Goal Achievement: 03/21/13 Potential to Achieve Goals: Fair  Visit Information  Last PT Received On: 03/07/13 Assistance Needed: +1 History of Present Illness: Pt is an 80 y/o female admitted s/p UTI. Upon arrival to room, spoke with case manager who states Palliative Care consult will be placed as son wants to take pt home with Hospice help.        Prior Functioning  Home Living Family/patient expects to be discharged to:: Private residence Living Arrangements: Children Available Help at Discharge: Family;Friend(s) Type of Home: House Home Access: Level entry (Small threshold ) Home Layout: One level Home Equipment: Tub bench;Hand held shower head Prior Function Level of Independence: Needs assistance Gait / Transfers Assistance Needed: Per son, able to ambulate independently around the house ADL's / Homemaking Assistance Needed: Occasionally needs assist for bathing and dressing Comments: Son states she is requiring more and more  assist with ADL's and feels she is  forgetting what she needs to do. Son also states she has started to give no response to questions when you are talking with her.  Communication Communication: No difficulties    Cognition  Cognition Arousal/Alertness: Awake/alert Behavior During Therapy: WFL for tasks assessed/performed Overall Cognitive Status: History of cognitive impairments - at baseline    Extremity/Trunk Assessment Upper Extremity Assessment Upper Extremity Assessment: Generalized weakness Lower Extremity Assessment Lower Extremity Assessment: Generalized weakness Cervical / Trunk Assessment Cervical / Trunk Assessment: Kyphotic   Balance Balance Overall balance assessment: Needs assistance Sitting-balance support: Feet supported;No upper extremity supported Sitting balance-Leahy Scale: Fair Standing balance support: Single extremity supported Standing balance-Leahy Scale: Fair  End of Session PT - End of Session Equipment Utilized During Treatment: Gait belt Activity Tolerance: Patient limited by fatigue Patient left: in chair;with call bell/phone within reach;with family/visitor present Nurse Communication: Mobility status  GP     Ruthann Cancer 03/07/2013, 11:15 AM  Ruthann Cancer, PT, DPT [phone]

## 2013-03-07 NOTE — Consult Note (Signed)
I have reviewed this case with our NP and agree with the Assessment and Plan as stated.  Danyeal Akens L. Vaneta Hammontree, MD MBA The Palliative Medicine Team at [facility] Team Phone: [phone] Pager: [phone]   

## 2013-03-07 NOTE — Progress Notes (Signed)
Subjective: [name] is doing about the same this morning.  Slept much better last night after dose of Haldol per her son.    Patient and her son met with palliative this morning and are going to pursue comfort care and hospice.  Son is still debating about IV fluids and antibiotics in future.  Offered him one week course of doxycycline (see below), and he is amenable.   Objective: Vital signs in last 24 hours: Filed Vitals:   03/06/13 1204 03/06/13 1317 03/06/13 2150 03/07/13 0436  BP: 149/67 139/64 173/76 188/75  Pulse: 73 69 83 88  Temp: 98.4 F (36.9 C) 98.8 F (37.1 C) 98.7 F (37.1 C)   TempSrc: Oral Axillary    Resp: $Remo'18 18 18 17  'UCAwl$ SpO2: 97% 97% 98% 97%   Weight change:   Intake/Output Summary (Last 24 hours) at 03/07/13 0725 Last data filed at 03/07/13 0400  Gross per 24 hour  Intake    726 ml  Output      4 ml  Net    722 ml   PEX General: alert, cooperative, NAD sitting in recliner HEENT: NCAT, vision grossly intact Neck: supple, no lymphadenopathy Lungs: clear to ascultation bilaterally, normal work of respiration, no wheezes, rales, ronchi Heart: 8-4/1 systolic murmur, regular rate and rhythm (paced), no gallops or rubs Abdomen: soft, non-tender, non-distended, normal bowel sounds Extremities: 2+ DP/PT pulses bilaterally, no cyanosis, clubbing, or edema Neurologic: alert & oriented X3, cranial nerves II-XII intact, strength grossly intact, sensation intact to light touch  Lab Results: Basic Metabolic Panel:  Recent Labs Lab 03/06/13 0630 03/07/13 0605  NA 132* 135*  K 3.8 3.8  CL 99 97  CO2 21 20  GLUCOSE 140* 202*  BUN 12 11  CREATININE 0.93 0.90  CALCIUM 8.2* 8.9   Liver Function Tests:  Recent Labs Lab 03/05/13 0834  AST 21  ALT 13  ALKPHOS 53  BILITOT 0.7  PROT 6.0  ALBUMIN 3.4*    Recent Labs Lab 03/05/13 0834  AMMONIA 23   CBC:   Recent Labs Lab 03/05/13 0834 03/06/13 0630  WBC 7.6 6.5  NEUTROABS 5.3  --   HGB 11.9*  11.5*  HCT 34.6* 34.2*  MCV 86.3 87.0  PLT 195 197   Cardiac Enzymes:  Recent Labs Lab 03/05/13 1440  TROPONINI <0.30   CBG:  Recent Labs Lab 03/05/13 2109 03/06/13 0619 03/06/13 1204 03/06/13 1613 03/06/13 2343 03/07/13 0624  GLUCAP 73 134* 145* 283* 143* 191*   Urinalysis:  Recent Labs Lab 03/05/13 0850  COLORURINE YELLOW  LABSPEC 1.012  PHURINE 6.0  GLUCOSEU NEGATIVE  HGBUR LARGE*  BILIRUBINUR NEGATIVE  KETONESUR NEGATIVE  PROTEINUR 100*  UROBILINOGEN 0.2  NITRITE NEGATIVE  LEUKOCYTESUR LARGE*    Studies/Results: Dg Chest 2 View  03/05/2013   CLINICAL DATA:  Altered mental status  EXAM: CHEST  2 VIEW  COMPARISON:  02/09/2013  FINDINGS: Left-sided pacer in place. Evidence of CABG again noted. Heart size at upper limits of normal. Patchy left basilar airspace disease is reidentified. Interval resolution of previously seen pleural effusions. Clips are partly visualized over the upper abdomen on the lateral projection with aortic calcification without calcified aneurysm.  IMPRESSION: Stable patchy left lower lobe airspace opacity which could represent atelectasis although slow to resolve pneumonia could appear similar. Followup to radiographic resolution is recommended in 4-6 weeks.  Resolution of previously seen pleural effusion.   Electronically Signed   By: Conchita Paris M.D.   On:  03/05/2013 10:16   Ct Head Wo Contrast  03/05/2013   CLINICAL DATA:  Confusion, altered mental status for several months  EXAM: CT HEAD WITHOUT CONTRAST  TECHNIQUE: Contiguous axial images were obtained from the base of the skull through the vertex without intravenous contrast.  COMPARISON:  05/18/2011  FINDINGS: Mild cortical volume loss noted with proportional ventricular prominence. Areas of periventricular white matter hypodensity are most compatible with small vessel ischemic change. No acute hemorrhage, infarct, or mass lesion is identified. Interval development of right basal  ganglial lacunar infarct. No midline shift. Left external capsule lacunar infarct reidentified. Motion artifact at the skullbase noted. Partial opacification of the left sphenoid sinus reidentified with internal hyperdense material but could indicate inspissated secretions. No skull fracture.  IMPRESSION: No acute intracranial finding. Chronic findings as above with bilateral basal ganglia lacunar infarcts.  Chronic left sphenoid sinusitis.   Electronically Signed   By: Christiana Pellant M.D.   On: 03/05/2013 09:45   Medications: I have reviewed the patient's current medications. Scheduled Meds: . cefTRIAXone (ROCEPHIN)  IV  1 g Intravenous Q24H  . clopidogrel  75 mg Oral QAC breakfast  . enoxaparin (LOVENOX) injection  40 mg Subcutaneous Q24H  . famotidine  20 mg Oral Daily  . insulin aspart  0-15 Units Subcutaneous TID WC  . insulin aspart  0-5 Units Subcutaneous QHS  . latanoprost  1 drop Left Eye QHS  . metoprolol  25 mg Oral BID  . multivitamin with minerals  1 tablet Oral Daily  . niacin  500 mg Oral BID  . predniSONE  5 mg Oral Q breakfast  . raloxifene  60 mg Oral Daily  . simvastatin  10 mg Oral q1800  . sodium chloride  3 mL Intravenous Q12H  . timolol  1 drop Both Eyes Daily   Continuous Infusions:  PRN Meds:.sodium chloride, acetaminophen, acetaminophen, ondansetron (ZOFRAN) IV, ondansetron, sodium chloride Assessment/Plan: #AMS due to UTI- Patient reportedly had increased urinary incontinence and complaints of dysuria prior to presentation.  UA on admission showed many WBCs, large leucocytes, large heme.  Urine culture with no growth.  GIven repeated UAs with pyruria, possible that patient is infected with bacteria that are difficult to culture (such as ureaplasmin urealyticum and mycoplasma) which would be treated with doxycycline.  Afebrile, no leucocytosis (though patient immunosuppressed due to chronic steroid use); she denies pain both subjectively and on exam again today.     Patient is s/p right nephrectomy and has a history of chronic UTIs for which she has taken prophylactic trimethoprim for the past several years per patient's son (urologist is Dr. Patsi Sears).  She was recently discharged on 02/10/13 for CAP and UTI though urine culture at that time showed 40,000 colonies/mL of multiple bacteria, none predominant.  Of note, CXR this admission showed stable patchy LLL airspace opacity with could represent atelectasis though slow to resolve PNA could appear similar with recommendation for repeat imagining in 4-6 weeks; resolution of previously seen pleural effusion. -doxycycline 100 mg BID x 7 days -discontinue home trimethoprim 100 mg BID including at discharge -repeat CXR in 04/2013  #Dementia- Likely vascular dementia, patient's baseline mental status unclear though patient's son states she is worsening over time.  B12 normal, TSH mildly depressed at 0.3 on 02/10/13.  Repeat TSH 0.237 thus continues to be depressed.  However, T3 109.2, T4 1.34 (within normal limits).  CT head on admission negative for acute intracranial findings; chronic findings with bilateral basal ganglia lacunar infarcts, chronic left  sphenoid sinusitis.  Of note, with regard to QT prolonging medications for agitation, patient is AV paced (due to history of complete heart block) thus QTC interval less relevant.  -palliative care consult for goals of care, appreciate recs; per palliative, patient's son prefers for her to be comfort care, palliative to arrange hospice follow-up, recommend Haldol 1 mg q6h prn for agitation and sleep  #HTN- stable, on metoprolol at home. -continue metoprolol 25 mg BID   #DM2- A1C 5.8% on 02/10/13.  Patient takes metformin 500 mg BID at home.  -SSI-M -CBGs AC, qhs  #CAD s/p CABG- in 2007, stable.  Continue home Plavix 75 mg daily.   #HL- continue niacin, statin   #RA- continue home prednisone 5 mg daily  #Code status- DNR, reiterated this admission  Dispo:  Disposition is deferred at this time, awaiting improvement of current medical problems.  Anticipated discharge today.   The patient does have a current PCP Red[facility] Pulling Arna Medici, MD) and does need an [facility] hospital follow-up appointment after discharge.   .Services Needed at time of discharge: Y = Yes, Blank = No PT:   OT:   RN:   Equipment:   Other:     LOS: 2 days   Sonya Poot, MD 03/07/2013, 7:25 AM

## 2013-03-08 NOTE — Discharge Summary (Signed)
  Date: 03/08/2013  Patient name: [name]  Medical record number: [id number]  Date of birth: 08/26/1924   This patient has been seen and the plan of care was discussed with the house staff. Please see their note for complete details. I concur with their findings and plan.  Jonah Blue, DO, FACP Faculty [facility] Internal Medicine Residency Program 03/08/2013, 1:46 PM

## 2013-04-10 DEATH — deceased

## 2013-05-06 ENCOUNTER — Telehealth: Payer: Self-pay

## 2013-05-06 NOTE — Telephone Encounter (Signed)
Patient past away @ Home per [facility] in [place] [employer] °

## 2013-06-19 IMAGING — CT CT HEAD W/O CM
1 series · 16 of 30 positions shown, 20 images · non-contrast
Comparison: 12/31/2008.

CLINICAL DATA: Hypoglycemia.  Unresponsive.  Improved with
administration of glucose.

CT HEAD WITHOUT CONTRAST
TECHNIQUE: Contiguous axial images were obtained from the base of
the skull through the vertex without contrast.

[Series 2: head routine 4.8 h37s · axial · 0.45mm/px · z∈[-96,+37]mm · 16 of 30 slices shown, 20 images]
[im 2/30  brain]
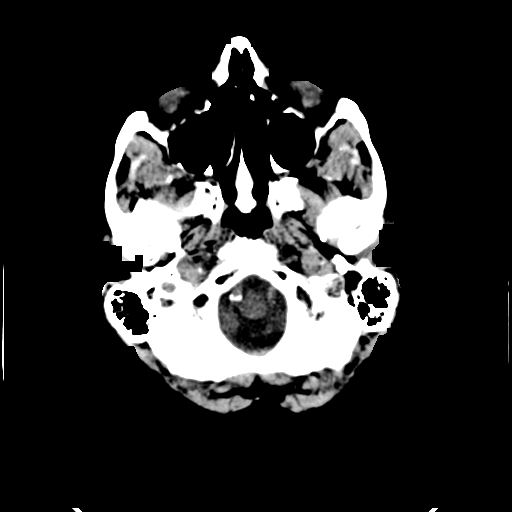
[im 2/30  bone]
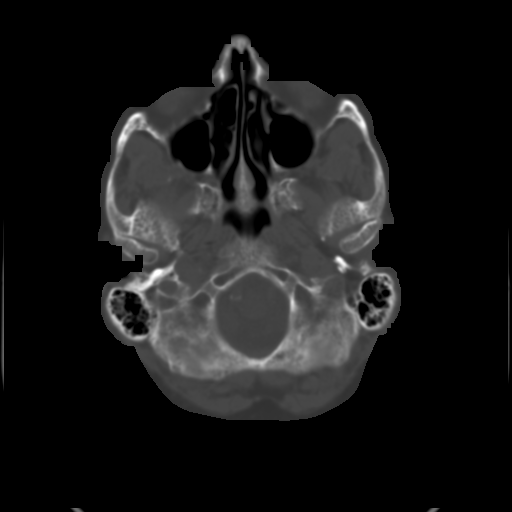
[im 4/30  brain]
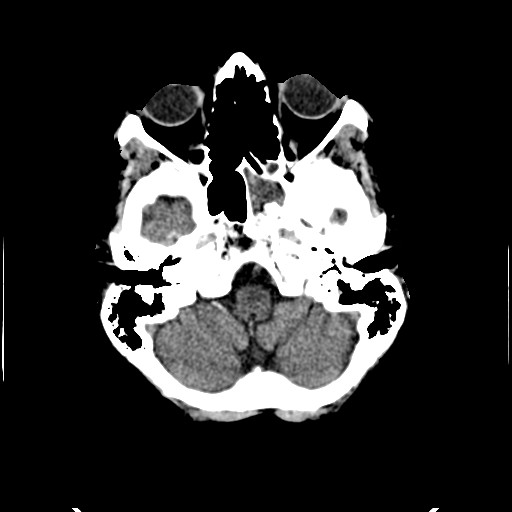
[im 6/30  brain]
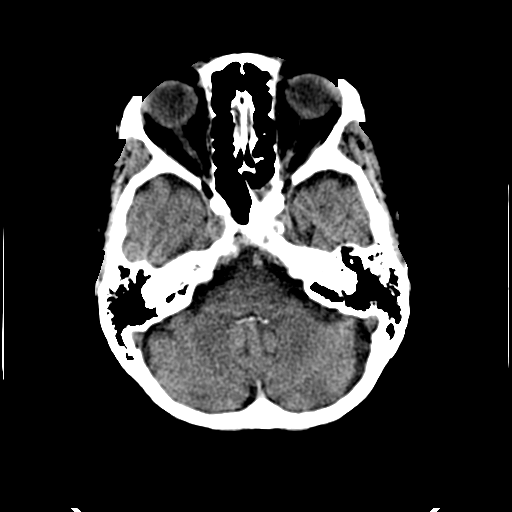
[im 8/30  brain]
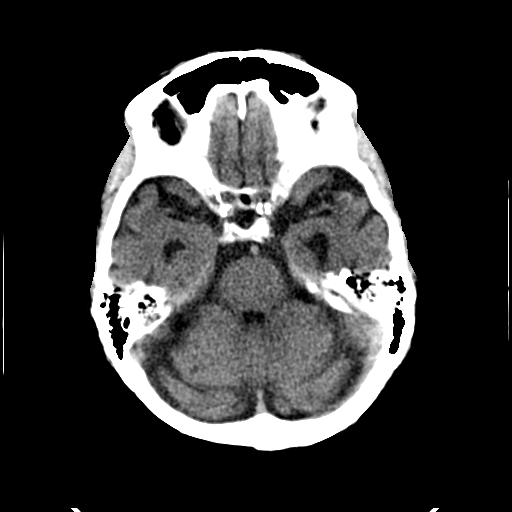
[im 9/30  brain]
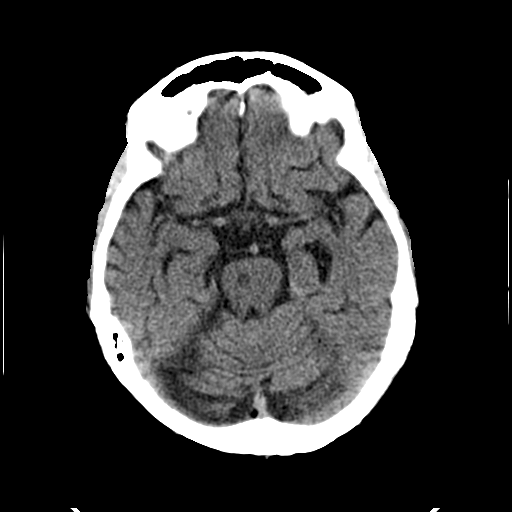
[im 9/30  bone]
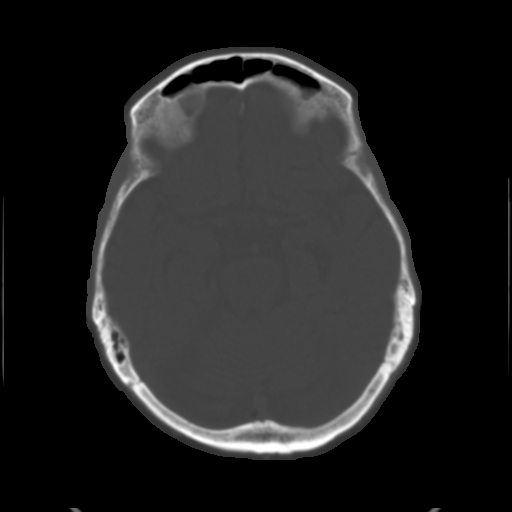
[im 11/30  brain]
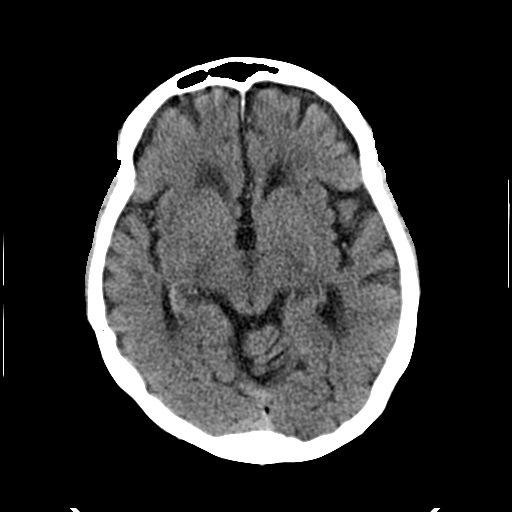
[im 13/30  brain]
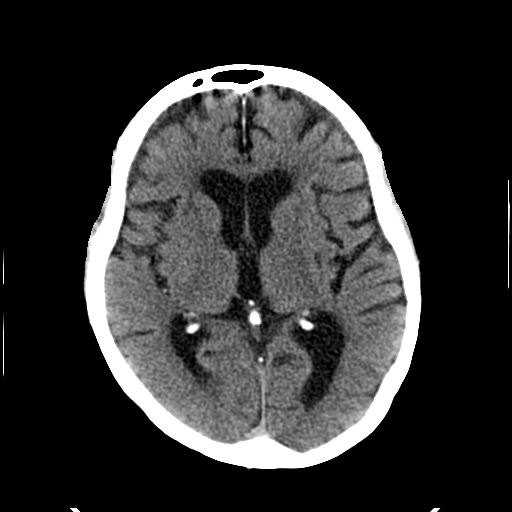
[im 15/30  brain]
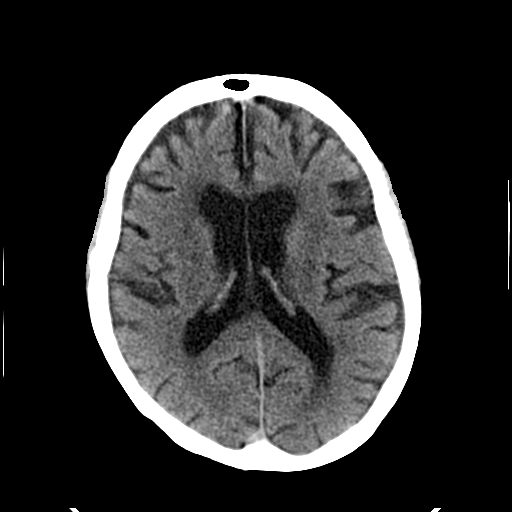
[im 16/30  brain]
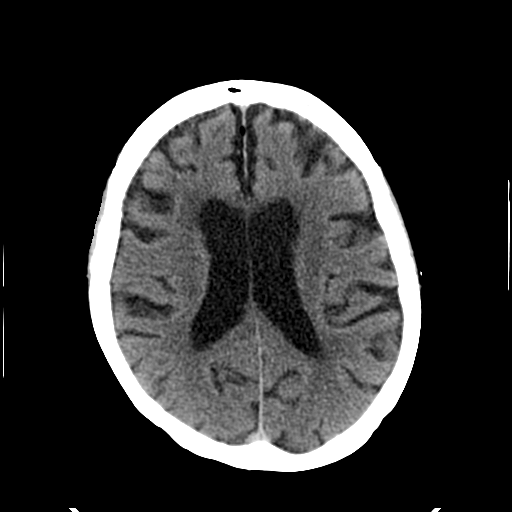
[im 16/30  bone]
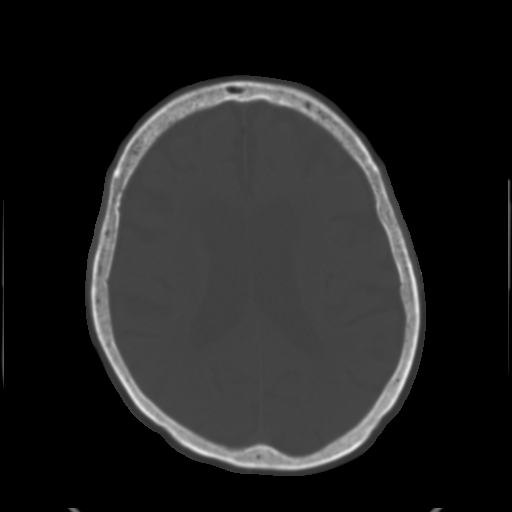
[im 18/30  brain]
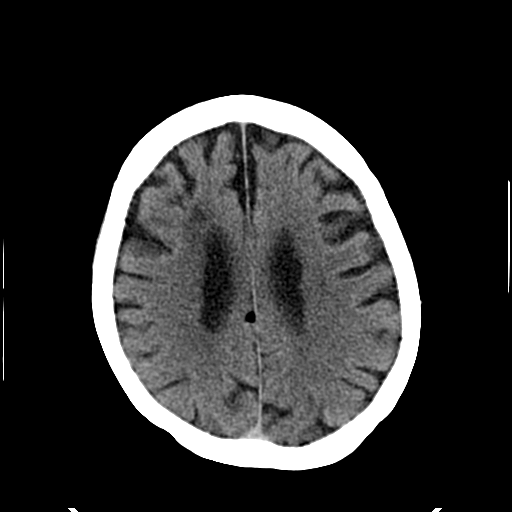
[im 20/30  brain]
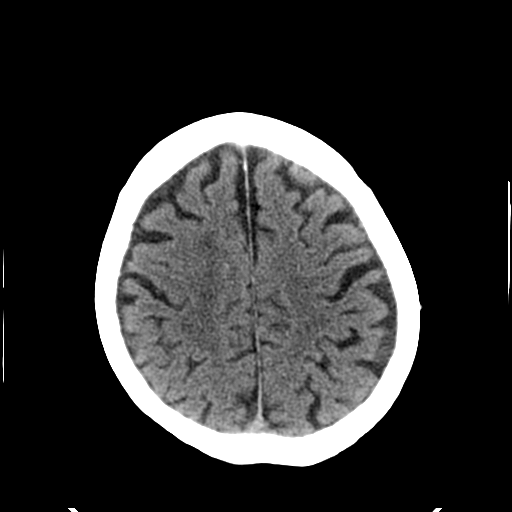
[im 22/30  brain]
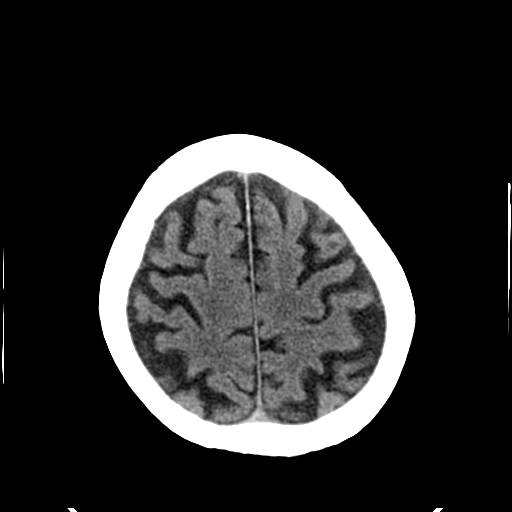
[im 23/30  brain]
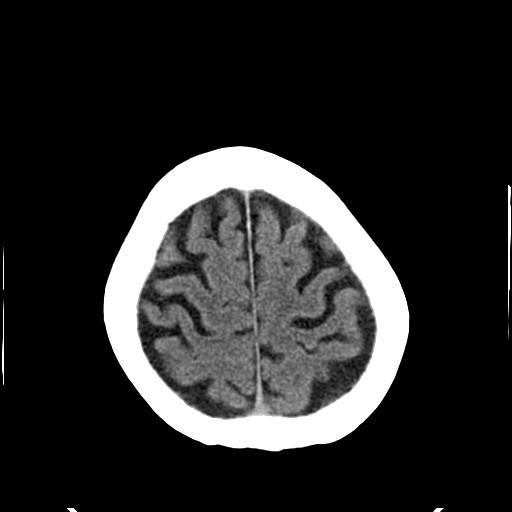
[im 23/30  bone]
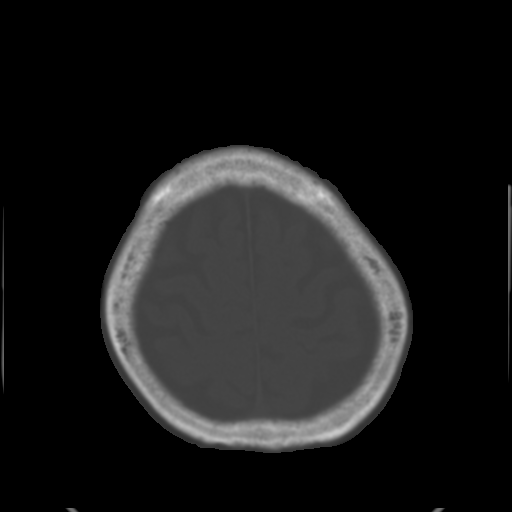
[im 25/30  brain]
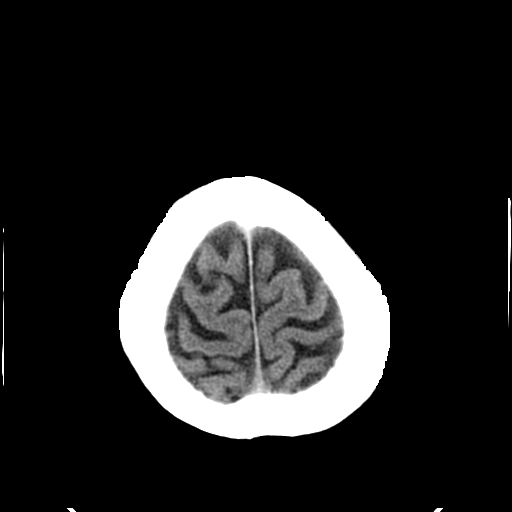
[im 27/30  brain]
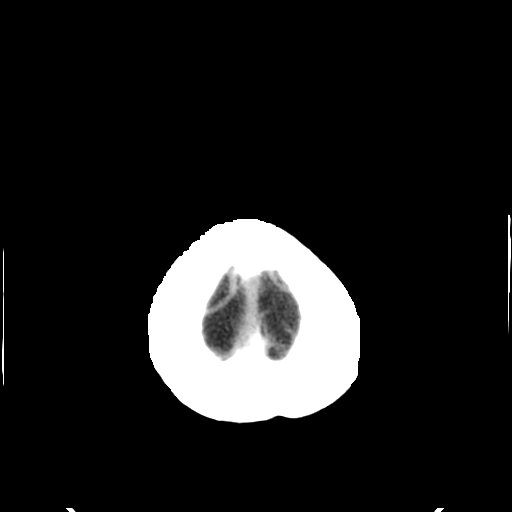
[im 29/30  brain]
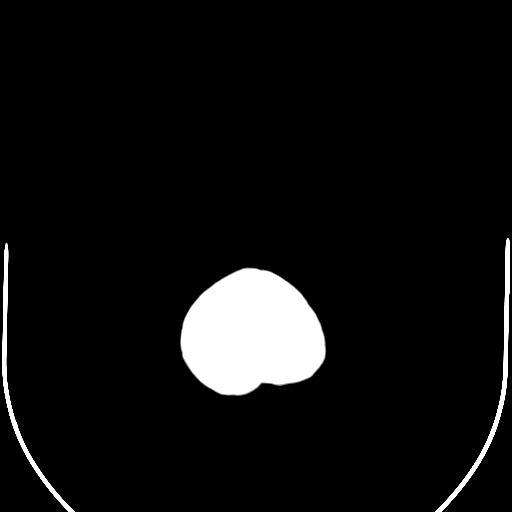

[16 of 30 positions shown; findings below may reference images not displayed]

FINDINGS: No intracranial hemorrhage.

Interval development of hypodensity within the right paracentral
pontine region.  This may represent a small acute infarct.  Result
of artifact not excluded.

Small vessel disease type changes.  Global atrophy without
hydrocephalus.  Vascular calcifications.

No intracranial mass lesion detected on this unenhanced exam..

Opacification in the left sphenoid sinus.
IMPRESSION: Interval development of hypodensity within the right paracentral
pontine region.  This may represent a small acute infarct.  Result
of artifact not excluded.

Opacification left sphenoid sinus.

## 2013-06-19 IMAGING — RF DG CYSTOGRAM 3+V
1 series · 1 of 1 positions shown · non-contrast
Comparison: CT scan, same date.

CLINICAL DATA: Ureteral obstruction.

Ureteral stent placement.

[Series 1: run · 1 of 1 slices shown]
[im 1/1]
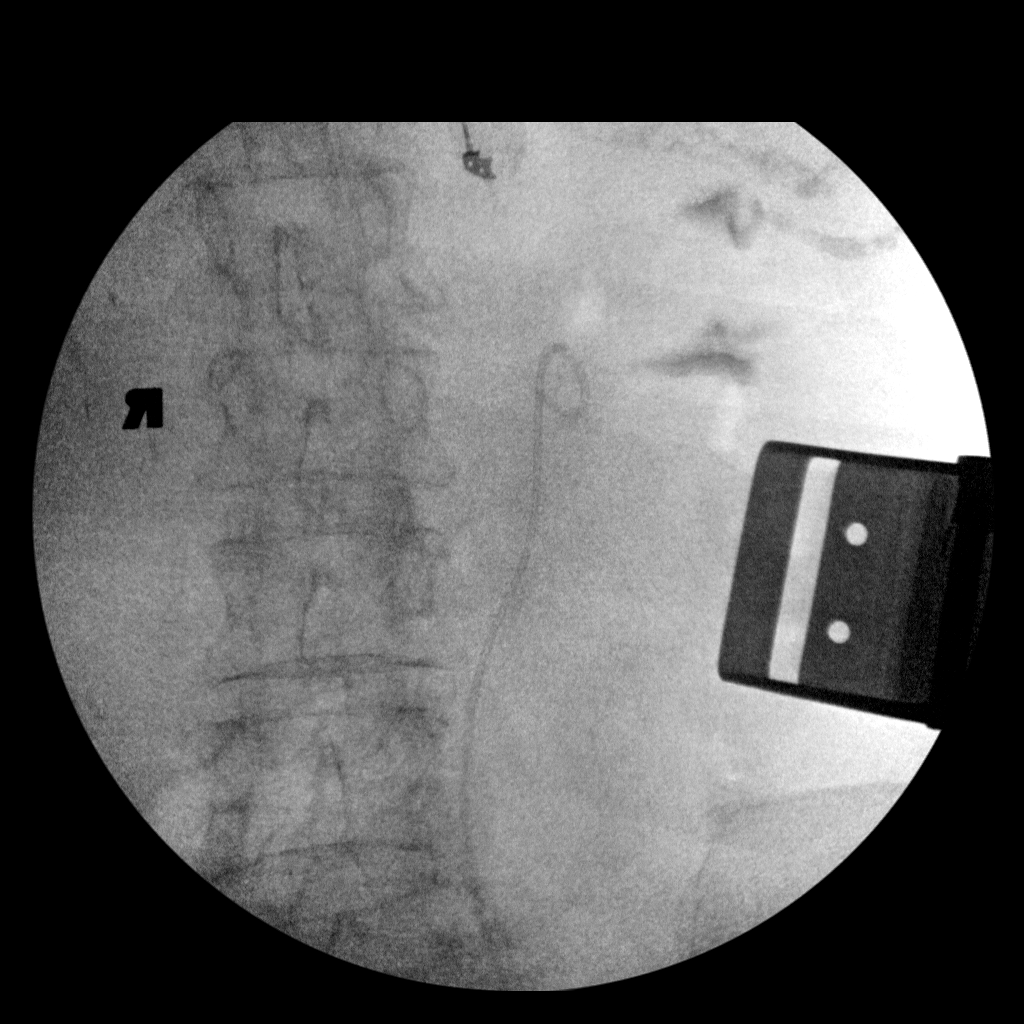

[1 of 1 positions shown; findings below may reference images not displayed]

FINDINGS: Single fluoroscopic spot image demonstrates a left-sided
ureteral stent with the tip in the region of the renal pelvis.
IMPRESSION: Left ureteral stent placement.

## 2013-06-19 IMAGING — CR DG CHEST 2V
2 series · 2 of 2 positions shown · non-contrast
Comparison: 12/29/2008

CLINICAL DATA: Confusion, shortness of breath, history
hypertension, diabetes, coronary artery disease, hypertrophic
cardiomyopathy

CHEST - 2 VIEW

[w chest lat]
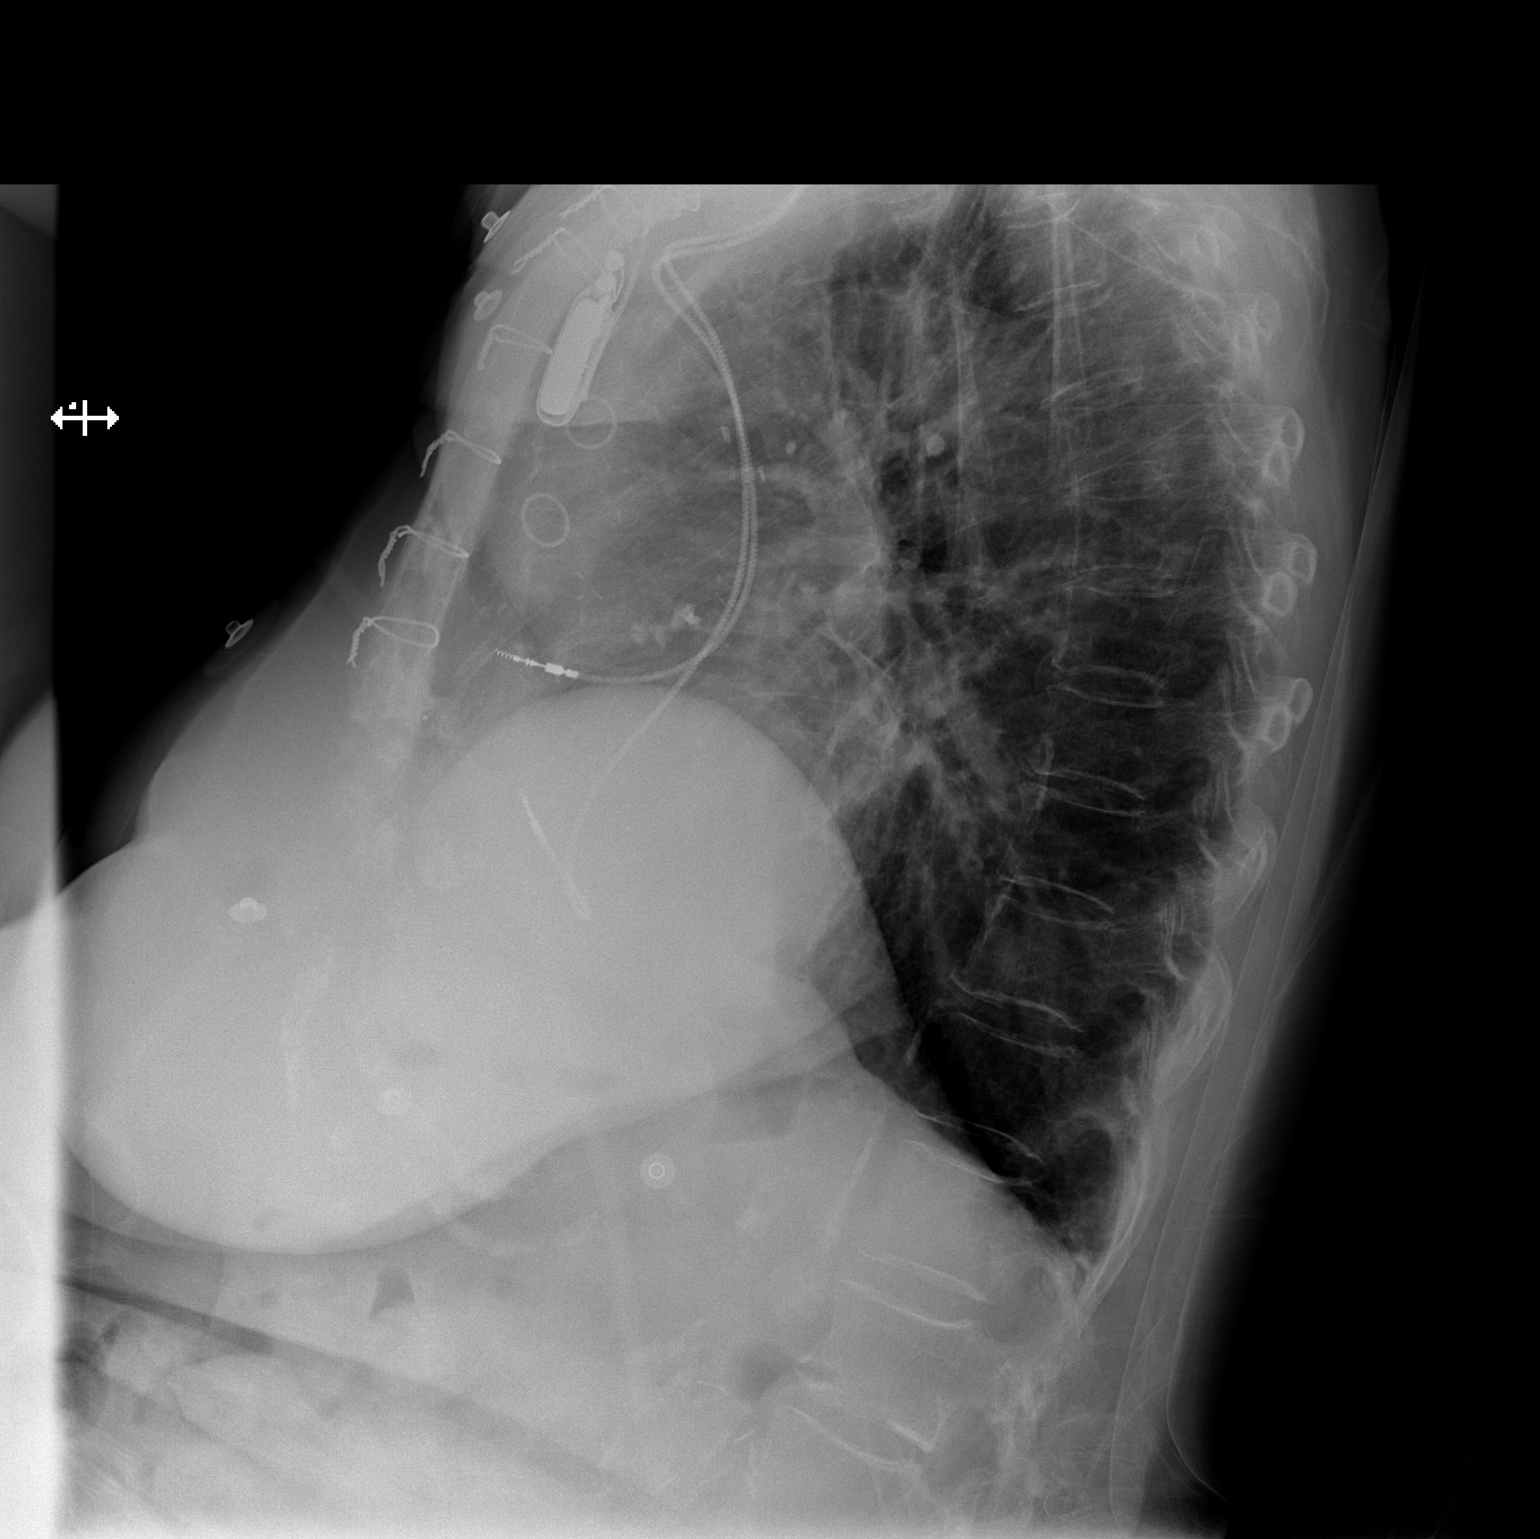

[x chest ap]
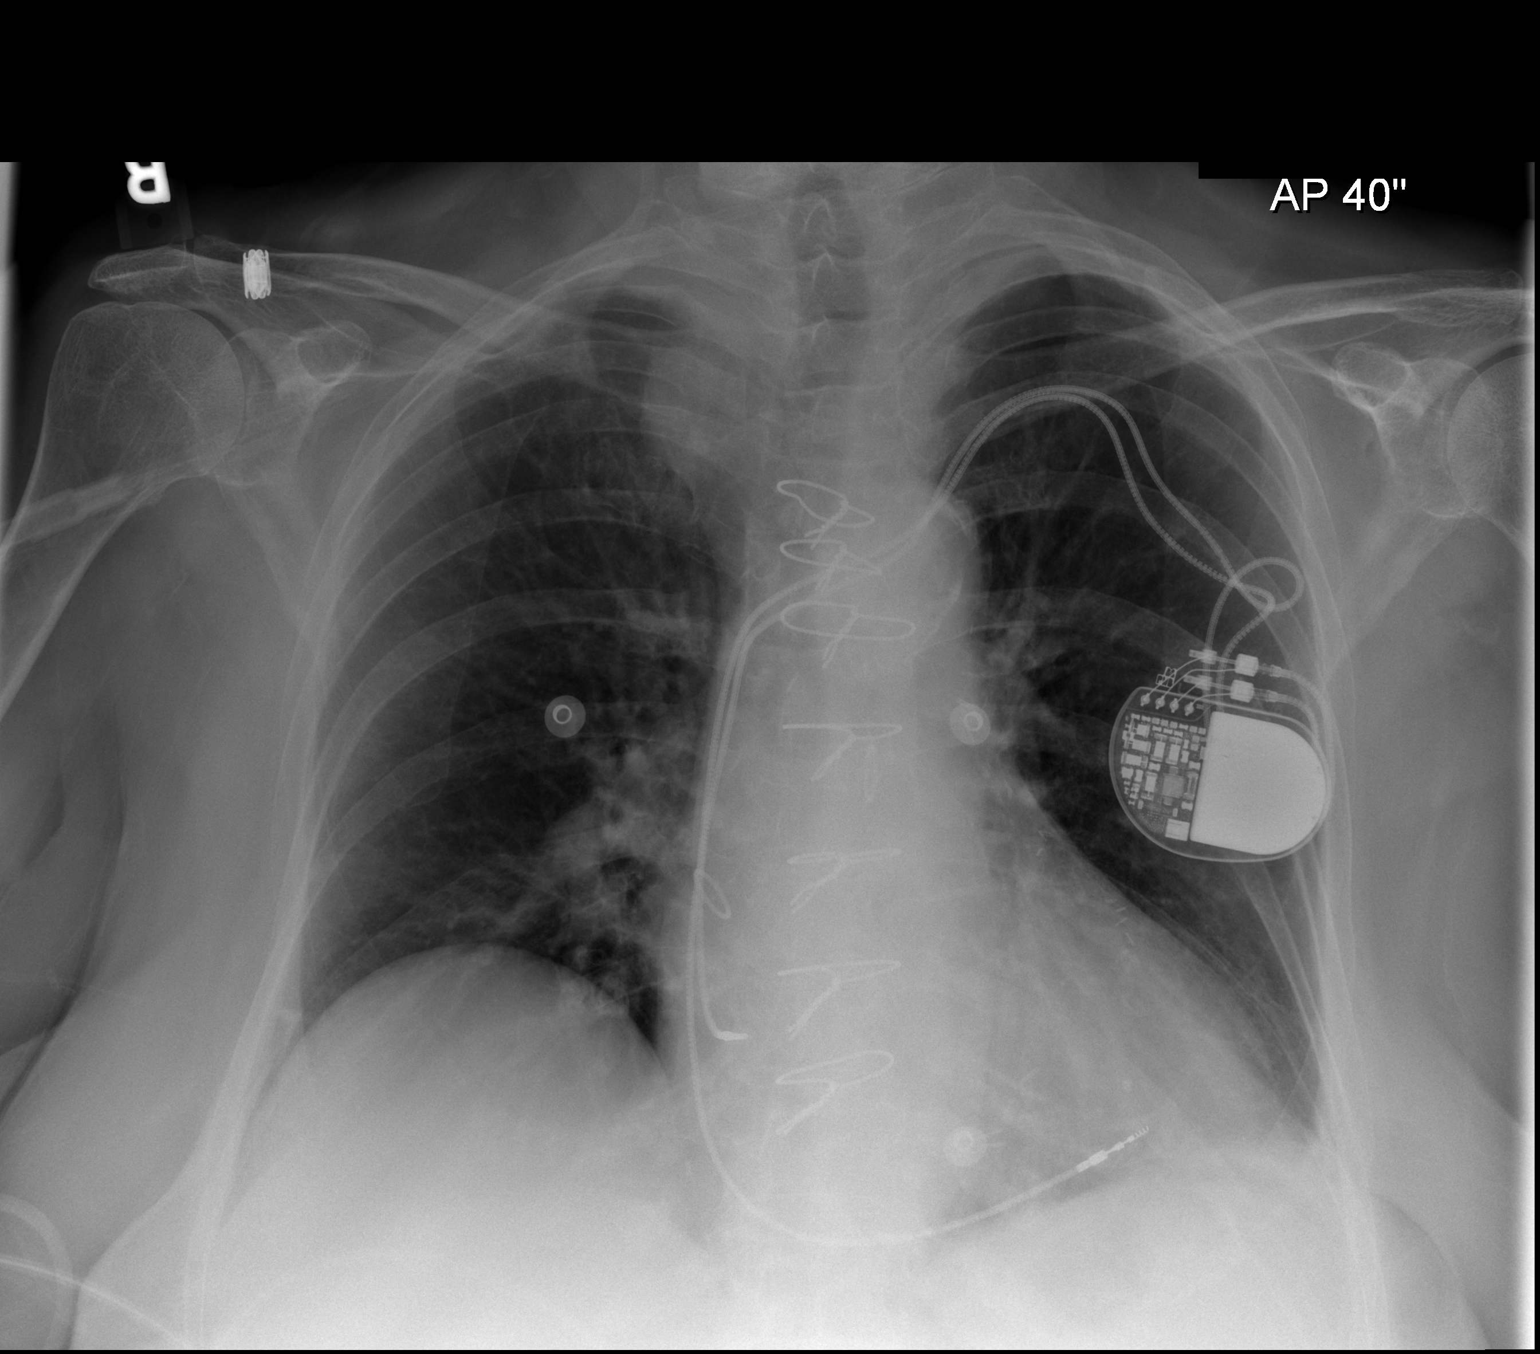

[2 of 2 positions shown; findings below may reference images not displayed]

FINDINGS: Left subclavian sequential transvenous pacemaker leads project at
right atrium and right ventricle.
Normal heart size post CABG.
Minimally prominent left superior mediastinal soft tissues stable,
question related to tortuous innominate artery.
Atherosclerotic calcification aorta.
Pulmonary vascularity normal.
Chronic elevation right diaphragm.
Minimal bibasilar atelectasis.
Aeration at left base improved since previous exam.
Upper lungs clear.
Bones diffusely demineralized.
IMPRESSION: Post CABG and pacemaker.
Minimal bibasilar atelectasis and chronic elevation right
diaphragm.
